# Patient Record
Sex: Male | Born: 1995
Health system: Southern US, Community
[De-identification: ages and names within clinical notes are randomized; demographics above are authoritative.]

## PROBLEM LIST (undated history)

## (undated) DIAGNOSIS — F329 Major depressive disorder, single episode, unspecified: Secondary | ICD-10-CM

## (undated) DIAGNOSIS — F909 Attention-deficit hyperactivity disorder, unspecified type: Secondary | ICD-10-CM

## (undated) DIAGNOSIS — F419 Anxiety disorder, unspecified: Secondary | ICD-10-CM

## (undated) DIAGNOSIS — F32A Depression, unspecified: Secondary | ICD-10-CM

## (undated) HISTORY — DX: Attention-deficit hyperactivity disorder, unspecified type: F90.9

---
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
## 2016-06-10 DIAGNOSIS — J101 Influenza due to other identified influenza virus with other respiratory manifestations: Secondary | ICD-10-CM | POA: Diagnosis not present

## 2016-08-08 DIAGNOSIS — H6503 Acute serous otitis media, bilateral: Secondary | ICD-10-CM | POA: Diagnosis not present

## 2016-08-08 DIAGNOSIS — J029 Acute pharyngitis, unspecified: Secondary | ICD-10-CM | POA: Diagnosis not present

## 2016-12-21 DIAGNOSIS — L03032 Cellulitis of left toe: Secondary | ICD-10-CM | POA: Diagnosis not present

## 2016-12-21 DIAGNOSIS — M79672 Pain in left foot: Secondary | ICD-10-CM | POA: Diagnosis not present

## 2017-01-03 DIAGNOSIS — L03032 Cellulitis of left toe: Secondary | ICD-10-CM | POA: Diagnosis not present

## 2017-05-02 DIAGNOSIS — F418 Other specified anxiety disorders: Secondary | ICD-10-CM | POA: Diagnosis not present

## 2017-06-12 DIAGNOSIS — F418 Other specified anxiety disorders: Secondary | ICD-10-CM | POA: Diagnosis not present

## 2017-06-21 DIAGNOSIS — F4323 Adjustment disorder with mixed anxiety and depressed mood: Secondary | ICD-10-CM | POA: Diagnosis not present

## 2017-06-27 DIAGNOSIS — F4323 Adjustment disorder with mixed anxiety and depressed mood: Secondary | ICD-10-CM | POA: Diagnosis not present

## 2017-07-20 ENCOUNTER — Inpatient Hospital Stay (HOSPITAL_COMMUNITY)
Admission: AD | Admit: 2017-07-20 | Discharge: 2017-07-25 | DRG: 885 | Disposition: A | Discharge status: Home / Self Care | Payer: BLUE CROSS/BLUE SHIELD | Attending: Psychiatry | Admitting: Psychiatry

## 2017-07-20 DIAGNOSIS — F332 Major depressive disorder, recurrent severe without psychotic features: Principal | ICD-10-CM | POA: Diagnosis present

## 2017-07-20 DIAGNOSIS — D696 Thrombocytopenia, unspecified: Secondary | ICD-10-CM | POA: Diagnosis present

## 2017-07-20 DIAGNOSIS — R45851 Suicidal ideations: Secondary | ICD-10-CM | POA: Diagnosis not present

## 2017-07-20 DIAGNOSIS — G4700 Insomnia, unspecified: Secondary | ICD-10-CM | POA: Diagnosis not present

## 2017-07-20 DIAGNOSIS — F39 Unspecified mood [affective] disorder: Secondary | ICD-10-CM | POA: Diagnosis not present

## 2017-07-20 DIAGNOSIS — Z630 Problems in relationship with spouse or partner: Secondary | ICD-10-CM | POA: Diagnosis not present

## 2017-07-20 DIAGNOSIS — F419 Anxiety disorder, unspecified: Secondary | ICD-10-CM | POA: Diagnosis not present

## 2017-07-20 DIAGNOSIS — R450 Nervousness: Secondary | ICD-10-CM | POA: Diagnosis not present

## 2017-07-20 DIAGNOSIS — Z560 Unemployment, unspecified: Secondary | ICD-10-CM | POA: Diagnosis not present

## 2017-07-20 HISTORY — DX: Anxiety disorder, unspecified: F41.9

## 2017-07-20 HISTORY — DX: Major depressive disorder, single episode, unspecified: F32.9

## 2017-07-20 HISTORY — DX: Depression, unspecified: F32.A

## 2017-07-20 MED ORDER — ALUM & MAG HYDROXIDE-SIMETH 200-200-20 MG/5ML PO SUSP
30.0000 mL | ORAL | Status: DC | PRN
Start: 2017-07-20 — End: 2017-07-25

## 2017-07-20 MED ORDER — MAGNESIUM HYDROXIDE 400 MG/5ML PO SUSP: 30.0000 mL | Freq: Every day | ORAL | Status: DC | PRN

## 2017-07-20 MED ORDER — TRAZODONE HCL 50 MG PO TABS
50.0000 mg | ORAL_TABLET | Freq: Every evening | ORAL | Status: DC | PRN
  Administered 2017-07-20: 50 mg via ORAL
  Filled 2017-07-20: qty 1

## 2017-07-20 MED ORDER — HYDROXYZINE HCL 10 MG PO TABS
10.0000 mg | ORAL_TABLET | Freq: Three times a day (TID) | ORAL | Status: DC | PRN
  Administered 2017-07-21 – 2017-07-25 (×7): 10 mg via ORAL
  Filled 2017-07-20 (×7): qty 1

## 2017-07-20 MED ORDER — ACETAMINOPHEN 325 MG PO TABS
650.0000 mg | ORAL_TABLET | Freq: Four times a day (QID) | ORAL | Status: DC | PRN
  Administered 2017-07-21 – 2017-07-25 (×2): 650 mg via ORAL
  Filled 2017-07-20 (×2): qty 2

## 2017-07-20 NOTE — BH Assessment (Addendum)
Assessment Note  Johnny Delgado is an 22 y.o. male who presents to [facility] as a walk-in. Pt reports he has been dealing with SI for the past 6 months and recently his thoughts have progressed into action plans. Pt states yesterday he stood in the middle of the road waiting to get hit by a car. Pt states he has attempted suicide twice in the past but will not disclose his methods. Pt was asked to identify the stressors that are causing him to feel suicidal and pt responded, "everything, just everything, this past year I've had a lot going on." Pt remains guarded throughout the assessment and does not disclose specifics to this Clinical research associate. Pt states he does not sleep well and often wakes up feeling fatigued. Pt reports his appetite is decreased and he eats "once or twice a day." Pt states he recently quit his job because his boss was "being a jerk."   Pt states he lives at home with his mother and brother. Pt denies any SA hx. Pt states his family convinced him to come to [facility] for help and states he is willing to stay voluntarily. Pt denies any hx of inpt hospitalization but states he is followed by an OPT provider for depression and anxiety.   Per Nira Conn, NP pt meets criteria for inpt treatment. Pt has been accepted to [facility] 402-1.  Diagnosis: MDD, recurrent, severe w/o psychosis  Past Medical History: No past medical history on file.   Family History: No family history on file.  Social History:  has no tobacco, alcohol, and drug history on file.  Additional Social History:  Alcohol / Drug Use Pain Medications: See MAR Prescriptions: See MAR Over the Counter: See MAR History of alcohol / drug use?: No history of alcohol / drug abuse  CIWA: CIWA-Ar BP: 125/81 Pulse Rate: 75 COWS:    Allergies: Allergies not on file  Home Medications:  No medications prior to admission.    OB/GYN Status:  No LMP for male patient.  General Assessment Data Location of Assessment: [facility] Assessment  Services TTS Assessment: In system Is this a Tele or Face-to-Face Assessment?: Face-to-Face Is this an Initial Assessment or a Re-assessment for this encounter?: Initial Assessment Marital status: Single Is patient pregnant?: No Pregnancy Status: No Living Arrangements: Parent, Other relatives Can pt return to current living arrangement?: Yes Admission Status: Voluntary Is patient capable of signing voluntary admission?: Yes Referral Source: Self/Family/Friend Insurance type: BCBS  Medical Screening Exam [facility] Walk-in ONLY) Medical Exam completed: Yes  Crisis Care Plan Living Arrangements: Parent, Other relatives Name of Psychiatrist: none Name of Therapist: Dr. Loreta Ave  Education Status Is patient currently in school?: No Highest grade of school patient has completed: some college   Risk to self with the past 6 months Suicidal Ideation: Yes-Currently Present Has patient been a risk to self within the past 6 months prior to admission? : Yes Suicidal Intent: Yes-Currently Present Has patient had any suicidal intent within the past 6 months prior to admission? : Yes Is patient at risk for suicide?: Yes Suicidal Plan?: Yes-Currently Present Has patient had any suicidal plan within the past 6 months prior to admission? : Yes Specify Current Suicidal Plan: pt states he attempted to get hit by a car by standing in the road  Access to Means: Yes Specify Access to Suicidal Means: pt has access to traffic  What has been your use of drugs/alcohol within the last 12 months?: denies  Previous Attempts/Gestures: Yes How many  times?: 2 Triggers for Past Attempts: Family contact, Spouse contact, Other personal contacts Intentional Self Injurious Behavior: Cutting Comment - Self Injurious Behavior: pt reports a hx of self-harming behaviors  Family Suicide History: Yes(cousin killed himself) Recent stressful life event(s): Job Loss, Financial Problems(pt is vague and does not give  specifics) Persecutory voices/beliefs?: No Depression: Yes Depression Symptoms: Despondent, Insomnia, Isolating, Tearfulness, Fatigue, Guilt, Loss of interest in usual pleasures, Feeling worthless/self pity Substance abuse history and/or treatment for substance abuse?: No Suicide prevention information given to non-admitted patients: Not applicable  Risk to Others within the past 6 months Homicidal Ideation: No Does patient have any lifetime risk of violence toward others beyond the six months prior to admission? : No Thoughts of Harm to Others: No Current Homicidal Intent: No Current Homicidal Plan: No Access to Homicidal Means: No History of harm to others?: No Assessment of Violence: None Noted Does patient have access to weapons?: No(guns in the home are locked away) Criminal Charges Pending?: No Does patient have a court date: No Is patient on probation?: No  Psychosis Hallucinations: None noted Delusions: None noted  Mental Status Report Appearance/Hygiene: Unremarkable Eye Contact: Good Motor Activity: Freedom of movement Speech: Logical/coherent Level of Consciousness: Alert Mood: Depressed, Worthless, low self-esteem, Sad, Sullen, Despair Affect: Depressed, Sad, Flat, Constricted, Sullen Anxiety Level: None Thought Processes: Relevant, Coherent Judgement: Impaired Orientation: Person, Place, Time, Situation, Appropriate for developmental age Obsessive Compulsive Thoughts/Behaviors: None  Cognitive Functioning Concentration: Normal Memory: Remote Intact, Recent Intact IQ: Average Insight: Poor Impulse Control: Poor Appetite: Fair Sleep: Decreased Total Hours of Sleep: 5 Vegetative Symptoms: None  ADLScreening [facility] Assessment Services) Patient's cognitive ability adequate to safely complete daily activities?: Yes Patient able to express need for assistance with ADLs?: Yes Independently performs ADLs?: Yes (appropriate for developmental age)  Prior  Inpatient Therapy Prior Inpatient Therapy: No  Prior Outpatient Therapy Prior Outpatient Therapy: Yes Prior Therapy Dates: current Prior Therapy Facilty/Provider(s): Dr. Loreta AveMann Reason for Treatment: MDD Does patient have an ACCT team?: No Does patient have Intensive In-House Services?  : No Does patient have [facility] services? : No Does patient have P4CC services?: No  ADL Screening (condition at time of admission) Patient's cognitive ability adequate to safely complete daily activities?: Yes Is the patient deaf or have difficulty hearing?: No Does the patient have difficulty seeing, even when wearing glasses/contacts?: No Does the patient have difficulty concentrating, remembering, or making decisions?: No Patient able to express need for assistance with ADLs?: Yes Does the patient have difficulty dressing or bathing?: No Independently performs ADLs?: Yes (appropriate for developmental age) Does the patient have difficulty walking or climbing stairs?: No Weakness of Legs: None Weakness of Arms/Hands: None  Home Assistive Devices/Equipment Home Assistive Devices/Equipment: None    Abuse/Neglect Assessment (Assessment to be complete while patient is alone) Abuse/Neglect Assessment Can Be Completed: Yes Physical Abuse: Denies Verbal Abuse: Denies Sexual Abuse: Denies Exploitation of patient/patient's resources: Denies Self-Neglect: Denies     Merchant navy officerAdvance Directives (For Healthcare) Does Patient Have a Medical Advance Directive?: No Would patient like information on creating a medical advance directive?: No - Patient declined    Additional Information 1:1 In Past 12 Months?: No CIRT Risk: No Elopement Risk: No Does patient have medical clearance?: Yes     Disposition: Per Nira ConnJason Berry, NP pt meets criteria for inpt treatment. [facility]   Disposition Initial Assessment Completed for this Encounter: Yes Disposition of Patient: Inpatient treatment  program Type of inpatient  treatment program: Adult([facility] 402-1 )  On Site Evaluation by:   Reviewed with Physician:    Karolee Ohs 07/20/2017 10:42 PM

## 2017-07-20 NOTE — H&P (Signed)
Behavioral Health Medical Screening Exam  Johnny Delgado is an 22 y.o. male.  Total Time spent with patient: 20 minutes  Psychiatric Specialty Exam: Physical Exam  Constitutional: He is oriented to person, place, and time. He appears well-developed and well-nourished. No distress.  HENT:  Head: Normocephalic and atraumatic.  Right Ear: External ear normal.  Left Ear: External ear normal.  Eyes: Conjunctivae are normal. Right eye exhibits no discharge. Left eye exhibits no discharge. No scleral icterus.  Cardiovascular: Normal rate.  Respiratory: Effort normal. No respiratory distress.  Musculoskeletal: Normal range of motion.  Neurological: He is alert and oriented to person, place, and time.  Skin: Skin is warm and dry. He is not diaphoretic.  Psychiatric: His speech is normal. His mood appears anxious. His affect is blunt. He is withdrawn. He is not actively hallucinating. Thought content is not paranoid and not delusional. He does not express impulsivity or inappropriate judgment. He exhibits a depressed mood. He expresses suicidal ideation. He expresses no homicidal ideation. He expresses suicidal plans.    Review of Systems  Constitutional: Negative for chills, fever and weight loss.  Psychiatric/Behavioral: Positive for depression and suicidal ideas. Negative for hallucinations, memory loss and substance abuse. The patient is nervous/anxious and has insomnia.   All other systems reviewed and are negative.   Blood pressure 125/81, pulse 75, temperature 98.6 F (37 C), resp. rate 16, SpO2 99 %.There is no height or weight on file to calculate BMI.  General Appearance: Casual and Well Groomed  Eye Contact:  Fair  Speech:  Clear and Coherent and Normal Rate  Volume:  Decreased  Mood:  Anxious, Depressed, Dysphoric, Hopeless and Worthless  Affect:  Blunt and Congruent  Thought Process:  Coherent, Goal Directed and Descriptions of Associations: Intact  Orientation:  Full (Time,  Place, and Person)  Thought Content:  Logical and Hallucinations: None  Suicidal Thoughts:  Yes.  with intent/plan  Homicidal Thoughts:  No  Memory:  Immediate;   Good Recent;   Good  Judgement:  Impaired  Insight:  Lacking  Psychomotor Activity:  Decreased  Concentration: Concentration: Fair and Attention Span: Fair  Recall:  Good  Fund of Knowledge:Good  Language: Good  Akathisia:  No  Handed:  Right  AIMS (if indicated):     Assets:  Communication Skills Desire for Improvement Financial Resources/Insurance Housing Intimacy Leisure Time Physical Health Transportation  Sleep:       Musculoskeletal: Strength & Muscle Tone: within normal limits Gait & Station: normal  Blood pressure 125/81, pulse 75, temperature 98.6 F (37 C), resp. rate 16, SpO2 99 %.  Recommendations:  Based on my evaluation the patient does not appear to have an emergency medical condition.    Jackelyn PolingJason A Oni Dietzman, NP 07/20/2017, 10:31 PM

## 2017-07-21 ENCOUNTER — Other Ambulatory Visit: Payer: Self-pay

## 2017-07-21 ENCOUNTER — Encounter (HOSPITAL_COMMUNITY): Payer: Self-pay

## 2017-07-21 DIAGNOSIS — F332 Major depressive disorder, recurrent severe without psychotic features: Principal | ICD-10-CM

## 2017-07-21 DIAGNOSIS — R45851 Suicidal ideations: Secondary | ICD-10-CM

## 2017-07-21 DIAGNOSIS — Z630 Problems in relationship with spouse or partner: Secondary | ICD-10-CM

## 2017-07-21 DIAGNOSIS — Z560 Unemployment, unspecified: Secondary | ICD-10-CM

## 2017-07-21 LAB — CBC
HCT: 44 % (ref 39.0–52.0)
Hemoglobin: 15.2 g/dL (ref 13.0–17.0)
MCH: 29.3 pg (ref 26.0–34.0)
MCHC: 34.5 g/dL (ref 30.0–36.0)
MCV: 84.8 fL (ref 78.0–100.0)
PLATELETS: 75 10*3/uL — AB (ref 150–400)
RBC: 5.19 MIL/uL (ref 4.22–5.81)
RDW: 12.4 % (ref 11.5–15.5)
WBC: 7.1 10*3/uL (ref 4.0–10.5)

## 2017-07-21 LAB — COMPREHENSIVE METABOLIC PANEL
ALBUMIN: 4.5 g/dL (ref 3.5–5.0)
ALT: 21 U/L (ref 17–63)
AST: 21 U/L (ref 15–41)
Alkaline Phosphatase: 54 U/L (ref 38–126)
Anion gap: 7 (ref 5–15)
BUN: 10 mg/dL (ref 6–20)
CHLORIDE: 105 mmol/L (ref 101–111)
CO2: 27 mmol/L (ref 22–32)
CREATININE: 0.9 mg/dL (ref 0.61–1.24)
Calcium: 9.2 mg/dL (ref 8.9–10.3)
GFR calc non Af Amer: >60 mL/min (ref >60)
GLUCOSE: 91 mg/dL (ref 65–99)
Potassium: 3.7 mmol/L (ref 3.5–5.1)
SODIUM: 139 mmol/L (ref 135–145)
Total Bilirubin: 0.8 mg/dL (ref 0.3–1.2)
Total Protein: 7.5 g/dL (ref 6.5–8.1)

## 2017-07-21 LAB — LIPID PANEL
CHOL/HDL RATIO: 5.4 ratio
Cholesterol: 172 mg/dL (ref 0–200)
HDL: 32 mg/dL — ABNORMAL LOW (ref >40)
LDL Cholesterol: 114 mg/dL — ABNORMAL HIGH (ref 0–99)
Triglycerides: 132 mg/dL (ref <150)
VLDL: 26 mg/dL (ref 0–40)

## 2017-07-21 LAB — TSH: TSH: 1.162 u[IU]/mL (ref 0.350–4.500)

## 2017-07-21 LAB — HEMOGLOBIN A1C
Hgb A1c MFr Bld: 5.7 % — ABNORMAL HIGH (ref 4.8–5.6)
MEAN PLASMA GLUCOSE: 116.89 mg/dL

## 2017-07-21 MED ORDER — TRAZODONE HCL 50 MG PO TABS
50.0000 mg | ORAL_TABLET | Freq: Every day | ORAL | Status: DC
  Administered 2017-07-21 – 2017-07-24 (×4): 50 mg via ORAL
  Filled 2017-07-21 (×7): qty 1

## 2017-07-21 MED ORDER — FLUOXETINE HCL 20 MG PO CAPS
40.0000 mg | ORAL_CAPSULE | Freq: Every day | ORAL | Status: DC
  Administered 2017-07-21 – 2017-07-25 (×5): 40 mg via ORAL
  Filled 2017-07-21 (×8): qty 2

## 2017-07-21 NOTE — B[facility] Suicide Risk Assessment (Signed)
[facility]   Nursing information obtained from:    Demographic factors:    Current Mental Status:    Loss Factors:    Historical Factors:    Risk Reduction Factors:     Total Time spent with patient: 45 minutes Principal Problem: Severe recurrent major depression without psychotic features (HCC) Diagnosis:   Patient Active Problem List   Diagnosis Date Noted  . Severe recurrent major depression without psychotic features [facility](HCC) [F33.2] 07/20/2017   Subjective Data:  22 y.o Caucasian male, single, lives with his family, unemployed, dropped out of college. Background history of MDD. Presented to the unit unaccompanied. Says his mother dropped him off. Reports being depressed and overwhelmed in the past six months. Guarded about his stressors at presentation. States that he has been thinking of dying for a while. He recently attempted to walk into traffic. Routine labs shows thrombocytopenia. No UDS or BAL on file. No past suicidal behavior, no family history of suicide, no evidence of psychosis. No evidence of mania. No cognitive impairment. No access to weapons. He is cooperative with care. He has agreed to treatment recommendations. He has agreed to communicate suicidal thoughts to staff if the thoughts becomes overwhelming.      Continued Clinical Symptoms:  Alcohol Use Disorder Identification Test Final Score (AUDIT): 0 The "Alcohol Use Disorders Identification Test", Guidelines for Use in Primary Care, Second Edition.  World Science writerHealth Organization [facility](WHO). Score between 0-7:  no or low risk or alcohol related problems. Score between 8-15:  moderate risk of alcohol related problems. Score between 16-19:  high risk of alcohol related problems. Score 20 or above:  warrants further diagnostic evaluation for alcohol dependence and treatment.   CLINICAL FACTORS:   Depression:   Impulsivity   Musculoskeletal: Strength & Muscle Tone: within normal limits Gait &  Station: normal Patient leans: N/A  Psychiatric Specialty Exam: Physical Exam  ROS  Blood pressure 125/81, pulse 96, temperature 97.7 F (36.5 C), temperature source Oral, resp. rate 18, height 5\' 9"  (1.753 m), weight 82.1 kg (181 lb), SpO2 99 %.Body mass index is 26.73 kg/m.  General Appearance: As in H&P  Eye Contact:    Speech:    Volume:    Mood:    Affect:    Thought Process:    Orientation:    Thought Content:    Suicidal Thoughts:    Homicidal Thoughts:    Memory:    Judgement:  As in H&P  Insight:    Psychomotor Activity:    Concentration:    Recall:    Fund of Knowledge:    Language:    Akathisia:    Handed:    AIMS (if indicated):     Assets:    ADL's:    Cognition:  As in H&P  Sleep:  Number of Hours: 5.5      COGNITIVE FEATURES THAT CONTRIBUTE TO RISK:   None  SUICIDE RISK:   Mild  PLAN OF CARE:  As in H&P I certify that inpatient services furnished can reasonably be expected to improve the patient's condition.   Georgiann CockerVincent A Izediuno, MD 07/21/2017, 12:23 PM

## 2017-07-21 NOTE — BHH Counselor (Signed)
Adult Comprehensive Assessment  Patient ID: Johnny Delgado, male   DOB: 1995/11/03, 22 y.o.   MRN: [id number]  Information Source: Information source: Patient  Current Stressors:  Educational / Learning stressors: Flunked out of college after first year, tried to go to [employer], but [employer] fell through so could not go this semester. Employment / Job issues: Quit job Wednesday, because boss was degrading Family Relationships: Father recently moved to [place], has been difficult for everyone. Financial / Lack of resources (include bankruptcy): Not making any money. Housing / Lack of housing: Denies stressors. Physical health (include injuries & life threatening diseases): Denies stressors. Social relationships: Few social relationships, but still hangs out with people. Substance abuse: Denies stressors. Bereavement / Loss: Lost one grandmother December 2017, other grandmother December 2016.  Living/Environment/Situation:  Living Arrangements: Parent, Other relatives(Mother, brother) Living conditions (as described by patient or guardian): Good How long has patient lived in current situation?: Whole life What is atmosphere in current home: Comfortable, Supportive  Family History:  Marital status: Single Are you sexually active?: No What is your sexual orientation?: Homosexual Does patient have children?: No  Childhood History:  By whom was/is the patient raised?: Both parents Description of patient's relationship with caregiver when they were a child: Mother - good, always connected well; Father - distant  Patient's description of current relationship with people who raised him/her: Mother - good; Father - building a better relationship, he just moved to [place] in July 2018.  Parents are still technically together. How were you disciplined when you got in trouble as a child/adolescent?: Grounding or spanking Does patient have siblings?: Yes Number of Siblings: 2 Description  of patient's current relationship with siblings: older sister (don't talk much, but like each other, would like to work on this relationship); younger brother (really good relationship) Did patient suffer any verbal/emotional/physical/sexual abuse as a child?: No Did patient suffer from severe childhood neglect?: No Has patient ever been sexually abused/assaulted/raped as an adolescent or adult?: No Was the patient ever a victim of a crime or a disaster?: No Witnessed domestic violence?: No Has patient been effected by domestic violence as an adult?: No  Education:  Highest grade of school patient has completed: Some college Currently a student?: No Learning disability?: No  Employment/Work Situation:   Employment situation: Unemployed(Only worked 3 days in his most recent job, quit Wednesday) What is the longest time patient has a held a job?: 3 years Where was the patient employed at that time?: Retail Has patient ever been in the [employer]?: Yes (Describe in comment)([employer] - 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 Are There Guns or Other Weapons in Your Home?: Yes Types of Guns/Weapons: Revolvers, pistols, rifles Are These Weapons Safely Secured?: Yes(Locked in gun safe, pt does not have access.)  Financial Resources:   Financial resources: Support from parents / caregiver, Private insurance(BCBS) Does patient have a Lawyer or guardian?: No  Alcohol/Substance Abuse:   What has been your use of drugs/alcohol within the last 12 months?: Has drank 3-4 times in the last year. Alcohol/Substance Abuse Treatment Hx: Denies past history Has alcohol/substance abuse ever caused legal problems?: No  Social Support System:    Patient's Community Support System: Good Describe Community Support System: Mother, father,  family, friends Type of faith/religion: Christianity How does patient's faith help to cope with current illness?: Praying helps  Leisure/Recreation:   Leisure and Hobbies: Draws, plays video games, goes out with friends occasionally  Strengths/Needs:   What things does the patient do well?: Drawing, video games, trombone In what areas does patient struggle / problems for patient: Depression, anxiety, thoughts of suicide, accepting and loving himself  Discharge Plan:   Does patient have access to transportation?: Yes Will patient be returning to same living situation after discharge?: Yes Currently receiving community mental health services: Yes (From Whom)([facility] - therapy; [facility] - medication management) Does patient have financial barriers related to discharge medications?: No  Summary/Recommendations:   Summary and Recommendations (to be completed by the evaluator): Patient is a 22yo male admitted with suicidal ideation over the last 6 months that have progressed into action plans, standing in the road prior to admission, waiting to get hit by a car, along with 2 previous suicide attempts that he would not discuss in detail.  Primary stressors include flunking out of college, not being able to go to community college because [facility] [employer] [facility] funding fell through, [place], insomnia, and quitting his job a few days ago after just 3 days because his boss was "a jerk."  Patient will benefit from crisis stabilization, medication evaluation, group therapy and psychoeducation, in addition to case management for discharge planning. At discharge it is recommended that Patient adhere to the established discharge plan and continue in treatment.  Lynnell ChadMareida J Grossman-Orr. 07/21/2017

## 2017-07-21 NOTE — BHH Group Notes (Signed)
LCSW Group Therapy Note  07/21/2017 9:30-10:30AM - 300 Hall, 10:30-11:30 - 400 Hall, 11:30-12:00 - 500 Hall  Type of Therapy and Topic:  Group Therapy: Anger Cues and Responses  Participation Level:  Active   Description of Group:   In this group, patients learned how to recognize the physical, cognitive, emotional, and behavioral responses they have to anger-provoking situations.  They identified a recent time they became angry and how they reacted.  They analyzed how their reaction was possibly beneficial and how it was possibly unhelpful.  The group discussed a variety of healthier coping skills that could help with such a situation in the future.  Deep breathing was practiced briefly.  Therapeutic Goals: 1. Patients will remember their last incident of anger and how they felt emotionally and physically, what their thoughts were at the time, and how they behaved. 2. Patients will identify how their behavior at that time worked for them, as well as how it worked against them. 3. Patients will explore possible new behaviors to use in future anger situations. 4. Patients will learn that anger itself is normal and cannot be eliminated, and that healthier reactions can assist with resolving conflict rather than worsening situations.  Summary of Patient Progress:  The patient shared that their most recent time of anger was last night when his friends performed an intervention about his depression and suicidal ideation.  He was gone for much of group seeing a provider.  Therapeutic Modalities:   Cognitive Behavioral Therapy  Lynnell ChadMareida J Grossman-Orr  07/21/2017 8:30 AM

## 2017-07-21 NOTE — Progress Notes (Signed)
Patient ID: Johnny Delgado, male   DOB: 12/16/1995, 21 y.o.   MRN: [id number]  Pt currently presents with a flat affect and appropriate behavior. Pt reports to writer that their goal is to "go to more groups." Pt states "I am feeling better, talking with other people who are feeling like me is great." Reports a good visit with brother and mother tonight. Pt reports good sleep with current medication regimen.   Pt provided with medications per providers orders. Pt's labs and vitals were monitored throughout the night. Pt given a 1:1 about emotional and mental status. Pt supported and encouraged to express concerns and questions. Pt educated on medications.  Pt's safety ensured with 15 minute and environmental checks. Pt currently denies SI/HI and A/V hallucinations. Pt verbally agrees to seek staff if SI/HI or A/VH occurs and to consult with staff before acting on any harmful thoughts. Will continue POC.

## 2017-07-21 NOTE — Progress Notes (Signed)
Patient ID: Johnny Delgado, male   DOB: 01/20/1996, 21 y.o.   MRN: [id number]  22 [facility]. Pt reports that while hanging out with friends last night he walked into traffic and had to be brought back to the shoulder. Brother and friend approached patient today to which patient endorsed ongoing depression and thoughts of killing himself. Pt brother notified mom and [facility]. Pt was started on Prozac "3-4 months ago" by his PCP and had been seeing Dr. Loreta AveMann for OP therapy. Pt missed last appointment 2 weeks ago. Pt states he wishes to "handle all of this and find peace with myself" [facility]. Reports on-going symptoms of depression including insomnia, anhedonia and difficulty concentrating. Consents signed, skin search completed and pt oriented to unit. Pt had no belongings upon admission, reports he sent everything home with mom. Pt stable at this time. Pt given the opportunity to express concerns and ask questions. Pt given toiletries. Will continue to monitor.

## 2017-07-21 NOTE — [facility] Group Notes (Signed)
   Date:  07/21/2017      Orientation / Goals    Time:  6:18 PM  Type of Therapy:  Nurse Education// The group focuses on teaching patients who their staff is and what the staff are responsibilityes as well as when they will snuggle and go to sleep.   Participation Level:  Active  Participation Quality:  Attentive  Affect:  Appropriate  Cognitive:  Appropriate  Insight:  Limited  Engagement in Group:  Engaged  Modes of Intervention:  Education  Summary of Progress/Problems:  Rich BraveDuke, Mairin Lindsley Lynn 07/21/2017, 6:18 PM
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
## 2017-07-21 NOTE — Tx Team (Signed)
Initial Treatment Plan 07/21/2017 1:17 AM Johnny BarbaraNolan Delgado Simons ZOX:[id number]    PATIENT STRESSORS: Medication change or noncompliance Other: ongoing depression and suicidal ideation   PATIENT STRENGTHS: Ability for insight Average or above average intelligence Communication skills Motivation for treatment/growth Supportive family/friends   PATIENT IDENTIFIED PROBLEMS: Depression  Suicidal ideation with plan - " walk into street"   "handle all of this- I don't think it's curable"  "find peace with myself"               DISCHARGE CRITERIA:  Ability to meet basic life and health needs Improved stabilization in mood, thinking, and/or behavior Need for constant or close observation no longer present Verbal commitment to aftercare and medication compliance  PRELIMINARY DISCHARGE PLAN: Outpatient therapy Return to previous living arrangement  PATIENT/FAMILY INVOLVEMENT: This treatment plan has been presented to and reviewed with the patient, Johnny Delgado Qadir .  The patient and family have been given the opportunity to ask questions and make suggestions.  Aurora Maskwyman, Cherokee Clowers E, RN 07/21/2017, 1:17 AM

## 2017-07-22 LAB — RAPID URINE DRUG SCREEN, HOSP PERFORMED
Amphetamines: NOT DETECTED
BARBITURATES: NOT DETECTED
Benzodiazepines: NOT DETECTED
Cocaine: NOT DETECTED
OPIATES: NOT DETECTED
TETRAHYDROCANNABINOL: NOT DETECTED

## 2017-07-22 NOTE — Progress Notes (Signed)
D. Pt presents with a flat affect and calm, cooperative behavior- observed attending group this am. Per pt's self inventory, pt rates his depression, hopelessness and anxiety a 4/3/3, respectively. Pt writes that his most important goal today is "developing skills to remain calm when feeling anxious" and "ask others for tips on how they handle it". Pt currently denies SI/HI and AVH A. Labs and vitals monitored. Pt given and educated on medications. Pt supported emotionally and encouraged to express concerns and ask questions.   R. Pt remains safe with 15 minute checks. Will continue POC.

## 2017-07-22 NOTE — [facility] Group Notes (Signed)
[facility] Group Notes:  (MHT Orientation)  Date:  07/22/2017  Time:  10 am Type of Therapy:  Orientation  Participation Level:  Active  Participation Quality:  Appropriate and Attentive  Affect:  Appropriate  Cognitive:  Alert and Appropriate  Insight:  Appropriate  Engagement in Group:  Engaged  Modes of Intervention:  Discussion  Summary of Progress/Problems: Patient engaged and participated appropriately in am group led by MHT Shela NevinValerie S Jadene Stemmer 07/22/2017, 1:56 PM

## 2017-07-22 NOTE — [facility] Group Notes (Signed)
[facility]  Date/Time:  07/22/2017 10:00-11:00AM  Type of Therapy and Topic:  Group Therapy:  Healthy and Unhealthy Supports  Participation Level:  Active   Description of Group:  Patients in this group were introduced to the idea of adding a variety of healthy supports to address the various needs in their lives.Patients discussed what additional healthy supports could be helpful in their recovery and wellness after discharge in order to prevent future hospitalizations.   An emphasis was placed on using counselor, doctor, therapy groups, 12-step groups, and problem-specific support groups to expand supports.  They also worked as a group on developing a specific plan for several patients to deal with unhealthy supports through boundary-setting, psychoeducation with loved ones, and even termination of relationships.   Therapeutic Goals:   1)  discuss importance of adding supports to stay well once out of the hospital  2)  compare healthy versus unhealthy supports and identify some examples of each  3)  generate ideas and descriptions of healthy supports that can be added  4)  offer mutual support about how to address unhealthy supports  5)  encourage active participation in and adherence to discharge plan    Summary of Patient Progress:  The patient expressed a willingness to add being a better self-support by opening up to people in his life who are supportive to help in his recovery journey.   Therapeutic Modalities:   Motivational Interviewing Brief Solution-Focused Therapy  Ambrose MantleMareida Grossman-Orr, LCSW

## 2017-07-22 NOTE — [facility] Group Notes (Signed)
[facility] Group Notes:  (MHT Orientation)  Date:  07/22/2017  Time:  10:41 AM  Type of Therapy:  Orientation  Participation Level:  Active  Participation Quality:  Appropriate and Attentive  Affect:  Appropriate  Cognitive:  Alert and Appropriate  Insight:  Appropriate  Engagement in Group:  Engaged  Modes of Intervention:  Discussion  Summary of Progress/Problems: Pt remained engaged during orientation group led by MHT  Shela NevinValerie S Oree Mirelez 07/22/2017, 10:41 AM

## 2017-07-22 NOTE — Progress Notes (Signed)
[place] MD Progress Note  07/22/2017 2:59 PM Johnny Delgado  MRN:  [id number] Subjective:   22 y.o Caucasian male, single, lives with his family, unemployed, dropped out of college. Background history of MDD. Presented to the unit unaccompanied. Says his mother dropped him off. Reports being depressed and overwhelmed in the past six months. Guarded about his stressors at presentation. States that he has been thinking of dying for a while. He recently attempted to walk into traffic. Routine labs shows thrombocytopenia. No UDS or BAL on file.  Chart reviewed today. Patient discussed at team today.  Staff reports that he has been participating at unit groups and activities. No behavioral issue. He has not voiced any futility thoughts. He has not been observed to be internally stimulated.   Seen today. Says he is feels good today. Is family came to visit last night. Says it went well. He has not had any suicidal thoughts lately. Says groups here has been helpful. He slept well last night. His energy levels are better. We talked about video games. Enjoys to talk about the games he plays. No evidence of psychosis. No evidence of mania. Encouraged.    Principal Problem: Severe recurrent major depression without psychotic features ([place]) Diagnosis:   Patient Active Problem List   Diagnosis Date Noted  . Severe recurrent major depression without psychotic features ([place]) [F33.2] 07/20/2017   Total Time spent with patient: 20 minutes  Past Psychiatric History: As in H&P  Past Medical History:  Past Medical History:  Diagnosis Date  . Anxiety   . Depression    History reviewed. No pertinent surgical history. Family History: History reviewed. No pertinent family history. Family Psychiatric  History: As in H&P Social History:  Social History   Substance and Sexual Activity  Alcohol Use No  . Frequency: Never     Social History   Substance and Sexual Activity  Drug Use No    Social History    Socioeconomic History  . Marital status: Single    Spouse name: None  . Number of children: None  . Years of education: None  . Highest education level: None  Social Needs  . Financial resource strain: None  . Food insecurity - worry: None  . Food insecurity - inability: None  . Transportation needs - medical: None  . Transportation needs - non-medical: None  Occupational History  . None  Tobacco Use  . Smoking status: Never Smoker  . Smokeless tobacco: Never Used  Substance and Sexual Activity  . Alcohol use: No    Frequency: Never  . Drug use: No  . Sexual activity: No  Other Topics Concern  . None  Social History Narrative  . None   Additional Social History:    Pain Medications: See MAR Prescriptions: See MAR Over the Counter: See MAR History of alcohol / drug use?: No history of alcohol / drug abuse                    Sleep: Good  Appetite:  Good  Current Medications: Current Facility-Administered Medications  Medication Dose Route Frequency Provider Last Rate Last Dose  . acetaminophen (TYLENOL) tablet 650 mg  650 mg Oral Q6H PRN Lindon Romp A, NP   650 mg at 07/21/17 1817  . alum & mag hydroxide-simeth (MAALOX/MYLANTA) 200-200-20 MG/5ML suspension 30 mL  30 mL Oral Q4H PRN Lindon Romp A, NP      . FLUoxetine (PROZAC) capsule 40 mg  40 mg Oral  Daily Artist Beach, MD   40 mg at 07/22/17 0801  . hydrOXYzine (ATARAX/VISTARIL) tablet 10 mg  10 mg Oral TID PRN Rozetta Nunnery, NP   10 mg at 07/21/17 2140  . magnesium hydroxide (MILK OF MAGNESIA) suspension 30 mL  30 mL Oral Daily PRN Rozetta Nunnery, NP      . traZODone (DESYREL) tablet 50 mg  50 mg Oral QHS Artist Beach, MD   50 mg at 07/21/17 2140    Lab Results:  Results for orders placed or performed during the hospital encounter of 07/20/17 (from the past 48 hour(s))  CBC     Status: Abnormal   Collection Time: 07/21/17  6:13 AM  Result Value Ref Range   WBC 7.1 4.0 - 10.5 K/uL    RBC 5.19 4.22 - 5.81 MIL/uL   Hemoglobin 15.2 13.0 - 17.0 g/dL   HCT 44.0 39.0 - 52.0 %   MCV 84.8 78.0 - 100.0 fL   MCH 29.3 26.0 - 34.0 pg   MCHC 34.5 30.0 - 36.0 g/dL   RDW 12.4 11.5 - 15.5 %   Platelets 75 (L) 150 - 400 K/uL    Comment: REPEATED TO VERIFY SPECIMEN CHECKED FOR CLOTS PLATELET COUNT CONFIRMED BY SMEAR Performed at [facility] [address] [place], [place] [address]   Comprehensive metabolic panel     Status: None   Collection Time: 07/21/17  6:13 AM  Result Value Ref Range   Sodium 139 135 - 145 mmol/L   Potassium 3.7 3.5 - 5.1 mmol/L   Chloride 105 101 - 111 mmol/L   CO2 27 22 - 32 mmol/L   Glucose, Bld 91 65 - 99 mg/dL   BUN 10 6 - 20 mg/dL   Creatinine, Ser 0.90 0.61 - 1.24 mg/dL   Calcium 9.2 8.9 - 10.3 mg/dL   Total Protein 7.5 6.5 - 8.1 g/dL   Albumin 4.5 3.5 - 5.0 g/dL   AST 21 15 - 41 U/L   ALT 21 17 - 63 U/L   Alkaline Phosphatase 54 38 - 126 U/L   Total Bilirubin 0.8 0.3 - 1.2 mg/dL   GFR calc non Af Amer >60 >60 mL/min   GFR calc Af Amer >60 >60 mL/min    Comment: (NOTE) The eGFR has been calculated using the CKD EPI equation. This calculation has not been validated in all clinical situations. eGFR's persistently <60 mL/min signify possible Chronic Kidney Disease.    Anion gap 7 5 - 15    Comment: Performed at [facility], [place] [address] [place], [place] [address]  Hemoglobin A1c     Status: Abnormal   Collection Time: 07/21/17  6:13 AM  Result Value Ref Range   Hgb A1c MFr Bld 5.7 (H) 4.8 - 5.6 %    Comment: (NOTE) Pre diabetes:          5.7%-6.4% Diabetes:              >6.4% Glycemic control for   <7.0% adults with diabetes    Mean Plasma Glucose 116.89 mg/dL    Comment: Performed at [facility] [address] [place],  [address]  Lipid panel     Status: Abnormal   Collection Time: 07/21/17  6:13 AM  Result Value Ref Range   Cholesterol 172 0 - 200 mg/dL   Triglycerides  132 <150 mg/dL   HDL 32 (L) >40 mg/dL   Total CHOL/HDL Ratio 5.4  RATIO   VLDL 26 0 - 40 mg/dL   LDL Cholesterol 114 (H) 0 - 99 mg/dL    Comment:        Total Cholesterol/HDL:CHD Risk Coronary Heart Disease Risk Table                     Men   Women  1/2 Average Risk   3.4   3.3  Average Risk       5.0   4.4  2 X Average Risk   9.6   7.1  3 X Average Risk  23.4   11.0        Use the calculated Patient Ratio above and the CHD Risk Table to determine the patient's CHD Risk.        ATP III CLASSIFICATION (LDL):  <100     mg/dL   Optimal  100-129  mg/dL   Near or Above                    Optimal  130-159  mg/dL   Borderline  160-189  mg/dL   High  >190     mg/dL   Very High Performed at [facility] [address], [place], [place] [address]   TSH     Status: None   Collection Time: 07/21/17  6:13 AM  Result Value Ref Range   TSH 1.162 0.350 - 4.500 uIU/mL    Comment: Performed by a 3rd Generation assay with a functional sensitivity of <=0.01 uIU/mL. Performed at [facility], [place] [address], [place], [place] [address]   Urine rapid drug screen (hosp performed)not at [facility]     Status: None   Collection Time: 07/21/17  6:36 AM  Result Value Ref Range   Opiates NONE DETECTED NONE DETECTED   Cocaine NONE DETECTED NONE DETECTED   Benzodiazepines NONE DETECTED NONE DETECTED   Amphetamines NONE DETECTED NONE DETECTED   Tetrahydrocannabinol NONE DETECTED NONE DETECTED   Barbiturates NONE DETECTED NONE DETECTED    Comment: (NOTE) DRUG SCREEN FOR MEDICAL PURPOSES ONLY.  IF CONFIRMATION IS NEEDED FOR ANY PURPOSE, NOTIFY LAB WITHIN 5 DAYS. LOWEST DETECTABLE LIMITS FOR URINE DRUG SCREEN Drug Class                     Cutoff (ng/mL) Amphetamine and metabolites    1000 Barbiturate and metabolites    200 Benzodiazepine                 357 Tricyclics and metabolites     300 Opiates and metabolites        300 Cocaine and metabolites         300 THC                            50 Performed at [facility], [place] [address], [place], [place] [address]     Blood Alcohol level:  No results found for: [facility]  Metabolic Disorder Labs: Lab Results  Component Value Date   HGBA1C 5.7 (H) 07/21/2017   MPG 116.89 07/21/2017   No results found for: PROLACTIN Lab Results  Component Value Date   CHOL 172 07/21/2017   TRIG 132 07/21/2017   HDL 32 (L) 07/21/2017   CHOLHDL 5.4 07/21/2017   VLDL 26 07/21/2017   LDLCALC 114 (H) 07/21/2017    Physical Findings: AIMS:  , ,  ,  ,  CIWA:    COWS:     Musculoskeletal: Strength & Muscle Tone: within normal limits Gait & Station: normal Patient leans: N/A  Psychiatric Specialty Exam: Physical Exam  Constitutional: He is oriented to person, place, and time. He appears well-developed and well-nourished.  HENT:  Head: Normocephalic and atraumatic.  Respiratory: Effort normal.  Neurological: He is alert and oriented to person, place, and time.  Psychiatric:  As above     ROS  Blood pressure 129/79, pulse 86, temperature (!) 97.2 F (36.2 C), resp. rate 18, height 5' 9"  (1.753 m), weight 82.1 kg (181 lb), SpO2 99 %.Body mass index is 26.73 kg/m.  General Appearance: neatly dressed, good relatedness. Appropriate behavior.   Eye Contact:  Good  Speech:  Clear and Coherent and Normal Rate  Volume:  Normal  Mood:  Feels better  Affect:  Appropriate and Full Range  Thought Process:  Linear  Orientation:  Full (Time, Place, and Person)  Thought Content:  No delusional theme. No preoccupation with violent thoughts. No negative ruminations. No obsession.  No hallucination in any modality.   Suicidal Thoughts:  None lately.  Homicidal Thoughts:  No  Memory:  Immediate;   Good Recent;   Good Remote;   Good  Judgement:  Good  Insight:  Good  Psychomotor Activity:  Normal  Concentration:  Concentration: Good and Attention Span: Good  Recall:  Good  Fund of  Knowledge:  Good  Language:  Good  Akathisia:  Negative  Handed:    AIMS (if indicated):     Assets:  Communication Skills Desire for Improvement Financial Resources/Insurance Housing Physical Health Resilience  ADL's:  Intact  Cognition:  WNL  Sleep:  Number of Hours: 6.75     Treatment Plan Summary: Patient is responding well to medication adjustment. He is interacting well with peers. He is not dwelling on his past relationship. No suicidal or homicidal thoughts lately. Would evaluate him further and gather collateral from his family. Hopeful discharge soon if he maintains progress.   Psychiatric: MDD  Medical:  Psychosocial:  Unemployed Still grieving the end of a relationship.   PLAN: 1. Continue current regimen 2. continue to monitor mood, behavior and interaction with peers 3. Continue to encourage unit groups and therapeutic activity     Artist Beach, MD 07/22/2017, 2:59 PM

## 2017-07-22 NOTE — Progress Notes (Signed)
Adult Psychoeducational Group Note  Date:  07/22/2017 Time:  9:57 PM  Group Topic/Focus:  Wrap-Up Group:   The focus of this group is to help patients review their daily goal of treatment and discuss progress on daily workbooks.  Participation Level:  Active  Participation Quality:  Appropriate and Attentive  Affect:  Appropriate  Cognitive:  Alert, Appropriate and Oriented  Insight: Good  Engagement in Group:  Engaged and Supportive  Modes of Intervention:  Discussion  Additional Comments:  Pt stated his goal for the day was to manage his anxiety, but he did not achieve this goal. Pt rated his day a 6.5/10 and stated today was better than yesterday. Pt stated he is excited for pet therapy on Tuesday, which he says will make his day better.  Leo GrosserMegan A Jamareon Shimel 07/22/2017, 9:57 PM

## 2017-07-23 DIAGNOSIS — G4700 Insomnia, unspecified: Secondary | ICD-10-CM

## 2017-07-23 DIAGNOSIS — R450 Nervousness: Secondary | ICD-10-CM

## 2017-07-23 DIAGNOSIS — F419 Anxiety disorder, unspecified: Secondary | ICD-10-CM

## 2017-07-23 DIAGNOSIS — F39 Unspecified mood [affective] disorder: Secondary | ICD-10-CM

## 2017-07-23 NOTE — [facility] Group Notes (Signed)
Group was facilitated and Clinical research associatewriter discussed rules and regulations of the unit, explained the schedule for the rest of the evening(phone times, med times, vitals and breakfast in the am.) Writer ask each patient how their day was on a scale of 1-10. Pt stated his was a 7. Pt stated he found some meditation that works for him and he may be leaving tomorrow. Writer spoke with patients about taking care of themselves and replacing negative thoughts with positive thoughts. Writer encourage patients to write down their accomplishments and celebrate them as they complete each one no matter how big or small. Writer spoke about setting boundaries and not feeling guilty for doing what is best for them.

## 2017-07-23 NOTE — Progress Notes (Signed)
D: Pt was in the dayroom upon initial approach.  Pt presents with anxious affect and mood.  His goal was to "find some coping mechanism for my anxiety."  He reports that "meditation" works for him.  Pt denies SI/HI, denies hallucinations, denies pain.  Pt has been visible in milieu interacting with peers and staff appropriately.  Pt attended evening group.    A: Introduced self to pt.  Actively listened to pt and offered support and encouragement. Medication administered per order.  PRN medication administered for anxiety.  Q15 minute safety checks maintained.  R: Pt is safe on the unit.  Pt is compliant with medications.  Pt verbally contracts for safety.  Will continue to monitor and assess.

## 2017-07-23 NOTE — Progress Notes (Signed)
[facility]  07/23/2017 1:25 PM Johnny Delgado  MRN:  [id number]   Subjective:  Patient reports that he is doing good today. He enjoys the groups and states that his anxiety has been low at 2/10 and his depression better at 3/10. He plans to move back in with his mom and he has discussed this with her. He states that he has a therapist, but not a psychiatrist. He denies any medication side effects. He denies any SI/HI/AVH and contracts for safety. He feels he needs to stay until Wednesday or Thursday and mainly for more groups, specifically the animal therapy because he really likes dogs.    Objective: Patient's chart and findings reviewed and discussed with treatment team. Patient presents in the day room and has been seen in there most of the day. He has been attending groups. He will continue same medications and hopeful discharge tomorrow. Will request CSW to get collateral from mother and to arrange for a psychiatrist for follow up.  Principal Problem: Severe recurrent major depression without psychotic features ([facility]) Diagnosis:   Patient Active Problem List   Diagnosis Date Noted  . Severe recurrent major depression without psychotic features ([facility]) [F33.2] 07/20/2017   Total Time spent with patient: 15 minutes  Past Psychiatric History: See H&P  Past Medical History:  Past Medical History:  Diagnosis Date  . Anxiety   . Depression    History reviewed. No pertinent surgical history. Family History: History reviewed. No pertinent family history. Family Psychiatric  History: See H&P Social History:  Social History   Substance and Sexual Activity  Alcohol Use No  . Frequency: Never     Social History   Substance and Sexual Activity  Drug Use No    Social History   Socioeconomic History  . Marital status: Single    Spouse name: None  . Number of children: None  . Years of education: None  . Highest education level: None  Social Needs  . Financial resource  strain: None  . Food insecurity - worry: None  . Food insecurity - inability: None  . Transportation needs - medical: None  . Transportation needs - non-medical: None  Occupational History  . None  Tobacco Use  . Smoking status: Never Smoker  . Smokeless tobacco: Never Used  Substance and Sexual Activity  . Alcohol use: No    Frequency: Never  . Drug use: No  . Sexual activity: No  Other Topics Concern  . None  Social History Narrative  . None   Additional Social History:    Pain Medications: See MAR Prescriptions: See MAR Over the Counter: See MAR History of alcohol / drug use?: No history of alcohol / drug abuse                    Sleep: Good  Appetite:  Good  Current Medications: Current Facility-Administered Medications  Medication Dose Route Frequency Provider Last Rate Last Dose  . acetaminophen (TYLENOL) tablet 650 mg  650 mg Oral Q6H PRN Nira ConnBerry, Jason A, NP   650 mg at 07/21/17 1817  . alum & mag hydroxide-simeth (MAALOX/MYLANTA) 200-200-20 MG/5ML suspension 30 mL  30 mL Oral Q4H PRN Nira ConnBerry, Jason A, NP      . FLUoxetine (PROZAC) capsule 40 mg  40 mg Oral Daily Izediuno, Delight OvensVincent A, MD   40 mg at 07/23/17 0743  . hydrOXYzine (ATARAX/VISTARIL) tablet 10 mg  10 mg Oral TID PRN Jackelyn PolingBerry, Jason A, NP  10 mg at 07/23/17 0744  . magnesium hydroxide (MILK OF MAGNESIA) suspension 30 mL  30 mL Oral Daily PRN Nira Conn A, NP      . traZODone (DESYREL) tablet 50 mg  50 mg Oral QHS Izediuno, Delight Ovens, MD   50 mg at 07/22/17 2224    Lab Results: No results found for this or any previous visit (from the past 48 hour(s)).  Blood Alcohol level:  No results found for: [facility]  Metabolic Disorder Labs: Lab Results  Component Value Date   HGBA1C 5.7 (H) 07/21/2017   MPG 116.89 07/21/2017   No results found for: PROLACTIN Lab Results  Component Value Date   CHOL 172 07/21/2017   TRIG 132 07/21/2017   HDL 32 (L) 07/21/2017   CHOLHDL 5.4 07/21/2017   VLDL 26  07/21/2017   LDLCALC 114 (H) 07/21/2017    Physical Findings: AIMS:  , ,  ,  ,    CIWA:    COWS:     Musculoskeletal: Strength & Muscle Tone: within normal limits Gait & Station: normal Patient leans: N/A  Psychiatric Specialty Exam: Physical Exam  Nursing note and vitals reviewed. Constitutional: He is oriented to person, place, and time. He appears well-developed and well-nourished.  Respiratory: Effort normal.  Musculoskeletal: Normal range of motion.  Neurological: He is alert and oriented to person, place, and time.  Skin: Skin is warm.    Review of Systems  Constitutional: Negative.   HENT: Negative.   Eyes: Negative.   Respiratory: Negative.   Cardiovascular: Negative.   Gastrointestinal: Negative.   Genitourinary: Negative.   Musculoskeletal: Negative.   Skin: Negative.   Neurological: Negative.   Endo/Heme/Allergies: Negative.   Psychiatric/Behavioral: Positive for depression. Negative for hallucinations and suicidal ideas. The patient is nervous/anxious.     Blood pressure 105/69, pulse (!) 108, temperature 97.7 F (36.5 C), temperature source Oral, resp. rate 16, height 5\' 9"  (1.753 m), weight 82.1 kg (181 lb), SpO2 99 %.Body mass index is 26.73 kg/m.  General Appearance: Casual  Eye Contact:  Good  Speech:  Clear and Coherent and Normal Rate  Volume:  Normal  Mood:  Euthymic  Affect:  Congruent  Thought Process:  Goal Directed and Descriptions of Associations: Intact  Orientation:  Full (Time, Place, and Person)  Thought Content:  WDL  Suicidal Thoughts:  No  Homicidal Thoughts:  No  Memory:  Immediate;   Good Recent;   Good Remote;   Good  Judgement:  Good  Insight:  Good  Psychomotor Activity:  Normal  Concentration:  Concentration: Good and Attention Span: Good  Recall:  Good  Fund of Knowledge:  Good  Language:  Good  Akathisia:  No  Handed:  Right  AIMS (if indicated):     Assets:  Communication Skills Desire for Improvement Financial  Resources/Insurance Housing Physical Health Social Support Transportation  ADL's:  Intact  Cognition:  WNL  Sleep:  Number of Hours: 6.25   Problems Addressed: MDD severe  Treatment Plan Summary: Daily contact with patient to assess and evaluate symptoms and progress in treatment, Medication management and Plan is to:  -Continue Prozac 40 mg PO Daily for mood stability -Continue Vistaril 10 mg PO TID PRN for anxiety -Continue Trazodone 50 mg PO QHS PRN for insomnia -Encourage group therapy participation  Maryfrances Bunnell, FNP 07/23/2017, 1:25 PM

## 2017-07-23 NOTE — Progress Notes (Signed)
D: Patient has been pleasant and interacts well with staff.  His main concern is his anxiety.  He states, "I want to tips to handle my anxiety.  I don't want to feel suicidal.  This is helping me a lot; talking to other people."  Patient presents with flat, anxious affect; his mood is depressed.  Patient denies any thoughts of self harm.  He is attending groups and participating in his treatment.  Patient's family will be notified today for collateral information.  A: Continue to monitor medication management and MD orders.  Safety checks completed every 15 minutes per protocol.  Offer support and encouragement as needed.  R: Patient is receptive to staff; his behavior is appropriate.

## 2017-07-23 NOTE — [facility] Group Notes (Signed)
[facility] LCSW Group Therapy Note  Date/Time: 07/23/17, 1315  Type of Therapy and Topic:  Group Therapy:  Overcoming Obstacles  Participation Level:  minimal  Description of Group:    In this group patients will be encouraged to explore what they see as obstacles to their own wellness and recovery. They will be guided to discuss their thoughts, feelings, and behaviors related to these obstacles. The group will process together ways to cope with barriers, with attention given to specific choices patients can make. Each patient will be challenged to identify changes they are motivated to make in order to overcome their obstacles. This group will be process-oriented, with patients participating in exploration of their own experiences as well as giving and receiving support and challenge from other group members.  Therapeutic Goals: 1. Patient will identify personal and current obstacles as they relate to admission. 2. Patient will identify barriers that currently interfere with their wellness or overcoming obstacles.  3. Patient will identify feelings, thought process and behaviors related to these barriers. 4. Patient will identify two changes they are willing to make to overcome these obstacles:    Summary of Patient Progress: Pt identified anxiety and depression as the main obstacles in his life currently.  Pt was distracted in group and with minimal participation in the group discussion.       Therapeutic Modalities:   Cognitive Behavioral Therapy Solution Focused Therapy Motivational Interviewing Relapse Prevention Therapy  Daleen SquibbGreg Itamar Mcgowan, LCSW

## 2017-07-23 NOTE — Progress Notes (Signed)
Adult Psychoeducational Group Note  Date:  07/23/2017 Time:  1:41 PM  Group Topic/Focus:  Wellness Toolbox:   The focus of this group is to discuss various aspects of wellness, balancing those aspects and exploring ways to increase the ability to experience wellness.  Patients will create a wellness toolbox for use upon discharge.  Participation Level:  Active  Participation Quality:  Appropriate  Affect:  Appropriate  Cognitive:  Alert and Appropriate  Insight: Appropriate, Good and Improving  Engagement in Group:  Engaged  Modes of Intervention:  Activity and Discussion  Additional Comments:  Pt did participate in all group activities and discussions today.  Adara Kittle R Lynora Dymond 07/23/2017, 1:41 PM

## 2017-07-23 NOTE — Progress Notes (Signed)
Recreation Therapy Notes  Date: 07/22/17 Time: 0930 Location: [address] Dayroom  Group Topic: Stress Management  Goal Area(s) Addresses:  Patient will verbalize importance of using healthy stress management.  Patient will identify positive emotions associated with healthy stress management.   Behavioral Response: Engaged  Intervention: Stress Management  Activity :  [employer] Meditation.  LRT played a meditation on the power of mountains to be resilient in the face of change.  Patients were to follow along with the meditation as it played.  Education:  Stress Management, Discharge Planning.   Education Outcome: Acknowledges edcuation/In group clarification offered/Needs additional education  Clinical Observations/Feedback: Pt attended group.     Caroll RancherMarjette Mosella Kasa, LRT/CTRS         Lillia AbedLindsay, Ruthellen Tippy A 07/23/2017 11:12 AM

## 2017-07-23 NOTE — [facility] Suicide Risk Assessment (Signed)
[facility] INPATIENT:  Family/Significant Other Suicide Prevention Education  Suicide Prevention Education:  Education Completed; Marrion CoyLisa Uhlig, mother, [phone], has been identified by the patient as the family member/significant other with whom the patient will be residing, and identified as the person(s) who will aid the patient in the event of a mental health crisis (suicidal ideations/suicide attempt).  With written consent from the patient, the family member/significant other has been provided the following suicide prevention education, prior to the and/or following the discharge of the patient.  The suicide prevention education provided includes the following:  Suicide risk factors  Suicide prevention and interventions  National Suicide Hotline telephone number  [facility] assessment telephone number  [facility] 911  [facility] and/or Residential Mobile Crisis Unit telephone number  Request made of family/significant other to:  Remove weapons (e.g., guns, rifles, knives), all items previously/currently identified as safety concern.  Pt father is a Therapist, nutritionalhunter, multiple guns, but they are secure in gun safe.  Remove drugs/medications (over-the-counter, prescriptions, illicit drugs), all items previously/currently identified as a safety concern.  The family member/significant other verbalizes understanding of the suicide prevention education information provided.  The family member/significant other agrees to remove the items of safety concern listed above.  Mother reports pt has been depressed for 1-2 years.  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  Recently quit a job.  Mother is concerned about pt being suicidal and safety and is asking pt directly about SI pretty regularly.  She is also a little worried about him having access to a lot of [facility] due to overdose potential.  He has had  several pretty bad anxiety attacks recently.  She has been visiting him every night and will be back tonight.  She is going to talk with him about a plan for how he will be using his time since he is not working or going to school right now.  Lorri FrederickWierda, Mikisha Roseland Jon, LCSW 07/23/2017, 10:59 AM

## 2017-07-23 NOTE — Progress Notes (Signed)
Patient ID: Rickey Barbaraolan W Baham, male   DOB: 06/05/1995, 22 y.o.   MRN: [id number]  Pt currently presents with an animated affect and anxious behavior. Pt reports to Clinical research associatewriter that he is sad that "some of my friends are leaving tomorrow." Pt endorsing emotional support from peers. Pt currently denying any SI at any point today. Pt reports good sleep with current medication regimen.   Pt provided with medications per providers orders. Pt's labs and vitals were monitored throughout the night. Pt given a 1:1 about emotional and mental status. Pt supported and encouraged to express concerns and questions. Pt educated on medications and assertiveness technqiues, encouraged to find support system post discharge. Reports he will reach out to his brother and friends.   Pt's safety ensured with 15 minute and environmental checks. Pt currently denies SI/HI and A/V hallucinations. Pt verbally agrees to seek staff if SI/HI or A/VH occurs and to consult with staff before acting on any harmful thoughts. Will continue POC.

## 2017-07-23 NOTE — Tx Team (Signed)
Interdisciplinary Treatment and Diagnostic Plan Update  07/23/2017 Time of Session: 0930 Johnny Delgado MRN: [id number]  Principal Diagnosis: Severe recurrent major depression without psychotic features [facility](HCC)  Secondary Diagnoses: Principal Problem:   Severe recurrent major depression without psychotic features (HCC)   Current Medications:  Current Facility-Administered Medications  Medication Dose Route Frequency Provider Last Rate Last Dose  . acetaminophen (TYLENOL) tablet 650 mg  650 mg Oral Q6H PRN Nira ConnBerry, Jason A, NP   650 mg at 07/21/17 1817  . alum & mag hydroxide-simeth (MAALOX/MYLANTA) 200-200-20 MG/5ML suspension 30 mL  30 mL Oral Q4H PRN Nira ConnBerry, Jason A, NP      . FLUoxetine (PROZAC) capsule 40 mg  40 mg Oral Daily Izediuno, Delight OvensVincent A, MD   40 mg at 07/23/17 0743  . hydrOXYzine (ATARAX/VISTARIL) tablet 10 mg  10 mg Oral TID PRN Jackelyn PolingBerry, Jason A, NP   10 mg at 07/23/17 0744  . magnesium hydroxide (MILK OF MAGNESIA) suspension 30 mL  30 mL Oral Daily PRN Nira ConnBerry, Jason A, NP      . traZODone (DESYREL) tablet 50 mg  50 mg Oral QHS Izediuno, Delight OvensVincent A, MD   50 mg at 07/22/17 2224   PTA Medications: Medications Prior to Admission  Medication Sig Dispense Refill Last Dose  . FLUoxetine (PROZAC) 20 MG capsule Take 20 mg by mouth daily.     Marland Kitchen. ibuprofen (ADVIL,MOTRIN) 400 MG tablet Take 400 mg by mouth every 6 (six) hours as needed for headache or mild pain.       Patient Stressors: Medication change or noncompliance Other: ongoing depression and suicidal ideation  Patient Strengths: Ability for insight Average or above average intelligence Communication skills Motivation for treatment/growth Supportive family/friends  Treatment Modalities: Medication Management, Group therapy, Case management,  1 to 1 session with clinician, Psychoeducation, Recreational therapy.   Physician Treatment Plan for Primary Diagnosis: Severe recurrent major depression without psychotic features  (HCC) Long Term Goal(s): Improvement in symptoms so as ready for discharge Improvement in symptoms so as ready for discharge   Short Term Goals: Ability to identify changes in lifestyle to reduce recurrence of condition will improve Ability to verbalize feelings will improve Ability to disclose and discuss suicidal ideas Ability to demonstrate self-control will improve Ability to identify and develop effective coping behaviors will improve Ability to maintain clinical measurements within normal limits will improve Compliance with prescribed medications will improve Ability to identify changes in lifestyle to reduce recurrence of condition will improve Ability to verbalize feelings will improve Ability to disclose and discuss suicidal ideas Ability to demonstrate self-control will improve Ability to identify and develop effective coping behaviors will improve Ability to maintain clinical measurements within normal limits will improve Compliance with prescribed medications will improve  Medication Management: Evaluate patient's response, side effects, and tolerance of medication regimen.  Therapeutic Interventions: 1 to 1 sessions, Unit Group sessions and Medication administration.  Evaluation of Outcomes: Progressing  Physician Treatment Plan for Secondary Diagnosis: Principal Problem:   Severe recurrent major depression without psychotic features (HCC)  Long Term Goal(s): Improvement in symptoms so as ready for discharge Improvement in symptoms so as ready for discharge   Short Term Goals: Ability to identify changes in lifestyle to reduce recurrence of condition will improve Ability to verbalize feelings will improve Ability to disclose and discuss suicidal ideas Ability to demonstrate self-control will improve Ability to identify and develop effective coping behaviors will improve Ability to maintain clinical measurements within normal limits will improve Compliance with  prescribed medications will improve Ability to identify changes in lifestyle to reduce recurrence of condition will improve Ability to verbalize feelings will improve Ability to disclose and discuss suicidal ideas Ability to demonstrate self-control will improve Ability to identify and develop effective coping behaviors will improve Ability to maintain clinical measurements within normal limits will improve Compliance with prescribed medications will improve     Medication Management: Evaluate patient's response, side effects, and tolerance of medication regimen.  Therapeutic Interventions: 1 to 1 sessions, Unit Group sessions and Medication administration.  Evaluation of Outcomes: Progressing   RN Treatment Plan for Primary Diagnosis: Severe recurrent major depression without psychotic features (HCC) Long Term Goal(s): Knowledge of disease and therapeutic regimen to maintain health will improve  Short Term Goals: Ability to identify and develop effective coping behaviors will improve and Compliance with prescribed medications will improve  Medication Management: RN will administer medications as ordered by provider, will assess and evaluate patient's response and provide education to patient for prescribed medication. RN will report any adverse and/or side effects to prescribing provider.  Therapeutic Interventions: 1 on 1 counseling sessions, Psychoeducation, Medication administration, Evaluate responses to treatment, Monitor vital signs and CBGs as ordered, Perform/monitor CIWA, COWS, AIMS and Fall Risk screenings as ordered, Perform wound care treatments as ordered.  Evaluation of Outcomes: Progressing   LCSW Treatment Plan for Primary Diagnosis: Severe recurrent major depression without psychotic features (HCC) Long Term Goal(s): Safe transition to appropriate next level of care at discharge, Engage patient in therapeutic group addressing interpersonal concerns.  Short Term Goals:  Engage patient in aftercare planning with referrals and resources, Increase social support and Increase skills for wellness and recovery  Therapeutic Interventions: Assess for all discharge needs, 1 to 1 time with Social worker, Explore available resources and support systems, Assess for adequacy in community support network, Educate family and significant other(s) on suicide prevention, Complete Psychosocial Assessment, Interpersonal group therapy.  Evaluation of Outcomes: Progressing   Progress in Treatment: Attending groups: Yes. Participating in groups: Yes. Taking medication as prescribed: Yes. Toleration medication: Yes. Family/Significant other contact made: Yes, individual(s) contacted:  mother Patient understands diagnosis: Yes. Discussing patient identified problems/goals with staff: Yes. Medical problems stabilized or resolved: Yes. Denies suicidal/homicidal ideation: Yes. Issues/concerns per patient self-inventory: No. Other: none  New problem(s) identified: No, Describe:  none  New Short Term/Long Term Goal(s):Pt goal: "To not feel suicidal, learn better ways to manage my depression."  Discharge Plan or Barriers:   Reason for Continuation of Hospitalization: Depression, Medication stabilization  Estimated Length of Stay: 1-2 days.    Attendees: Patient:Johnny Delgado 07/23/2017   Physician: Dr Jackquline Berlin, MD 07/23/2017   Nursing: Joslyn Devon, RN 07/23/2017   RN Care Manager: 07/23/2017   Social Worker: Daleen Squibb, LCSW 07/23/2017   Recreational Therapist:  07/23/2017   Other:  07/23/2017   Other:  07/23/2017   Other: 07/23/2017     Scribe for Treatment Team: Lorri Frederick, LCSW 07/23/2017 2:28 PM

## 2017-07-24 NOTE — Plan of Care (Signed)
  Progressing Activity: Sleeping patterns will improve 07/24/2017 [id number] - Progressing by Arrie Aranhurch, Catrell Morrone J, RN Note Pt slept 6.75 hours last night.

## 2017-07-24 NOTE — Progress Notes (Signed)
Adult Psychoeducational Group Note  Date:  07/24/2017 Time:  1615 Group Topic/Focus:  Overcoming Stress:   The focus of this group is to define stress and help patients assess their triggers.  Participation Level:  Active  Participation Quality:  Appropriate  Affect:  Appropriate  Cognitive:  Appropriate  Insight: Appropriate  Engagement in Group:  Engaged  Modes of Intervention:  Discussion, Education and Support  Additional Comments:  Group focus on grounding exercises  Gwenevere Ghazili, Ayano Douthitt Patience 07/24/2017, 5:03 PM

## 2017-07-24 NOTE — Progress Notes (Signed)
Pt presents with an anxious mood. Pt reports decreased symptoms of depression and anxiety today. Pt rates depression 5/10. Anxiety 4/10. Pt denies SI/HI. Pt verbalized to Clinical research associatewriter that he was told by the CSW and NP yesterday that the MD would meet with him yesterday to discuss a possibility of discharging today but never did meet with him. Tx team made aware of pt concerns. Verbal support provided. Pt encouraged to attend groups. 15 minute checks performed for safety.

## 2017-07-24 NOTE — Progress Notes (Signed)
Adult Psychoeducational Group Note  Date:  07/24/2017 Time:  1515 Group Topic/Focus:  Relaxation activities.   The purpose of this group is to help patients identify activities for coping with anxiety and depression.   Participation Level:  Active  Participation Quality:  Appropriate  Affect:  Appropriate  Cognitive:  Appropriate  Insight: Appropriate  Engagement in Group:  Engaged  Modes of Intervention:  Activity  Additional Comments:    Carmesha Morocco L 07/24/2017  

## 2017-07-24 NOTE — [facility] Group Notes (Signed)
[facility] LCSW Group Therapy Note  Date/Time: 07/24/17, 1315  Type of Therapy/Topic:  Group Therapy:  Feelings about Diagnosis  Participation Level:  Active   Mood:anxious   Description of Group:    This group will allow patients to explore their thoughts and feelings about diagnoses they have received. Patients will be guided to explore their level of understanding and acceptance of these diagnoses. Facilitator will encourage patients to process their thoughts and feelings about the reactions of others to their diagnosis, and will guide patients in identifying ways to discuss their diagnosis with significant others in their lives. This group will be process-oriented, with patients participating in exploration of their own experiences as well as giving and receiving support and challenge from other group members.   Therapeutic Goals: 1. Patient will demonstrate understanding of diagnosis as evidence by identifying two or more symptoms of the disorder:  2. Patient will be able to express two feelings regarding the diagnosis 3. Patient will demonstrate ability to communicate their needs through discussion and/or role plays  Summary of Patient Progress:Pt very active in group discussion today regarding diagnoses, symptoms, and mental health stigma.  Halfway through group, pt appeared to experience a panic attack.  Group was stopped and RN/MHT came and checked vital signs.  Pt initially indicated he could not get up or walk but was eventually able to do so.  Group restarted and pt rejoined the group at the end and appeared to be better.          Therapeutic Modalities:   Cognitive Behavioral Therapy Brief Therapy Feelings Identification   Johnny SquibbGreg Anara Cowman, LCSW

## 2017-07-24 NOTE — Progress Notes (Signed)
Patient was experiencing an intense moment of anxiety causing his to feel a state of panic.  Patient's vital signs were taking and found to be within normal limits with an exception of a slight elevation of his heart rate.  Patient reported feeling unable to move and stuck in his chair but was able to be assisted to his room by staff.  Patient was unable to identify a precipitating factor in this event, but was able to use music as a coping skill to help ease the intensity of his anxiety. Once patient was calm he was offered prn anxiety medications and was able to rejoin group.

## 2017-07-24 NOTE — Plan of Care (Signed)
  Progressing Spiritual Needs Ability to function at adequate level 07/24/2017 1136 - Progressing by Layla BarterWhite, Tirzah Fross L, RN Activity: Interest or engagement in activities will improve 07/24/2017 1136 - Progressing by Layla BarterWhite, Bentlie Catanzaro L, RN Education: Mental status will improve 07/24/2017 1136 - Progressing by Layla BarterWhite, Chantil Bari L, RN Coping: Ability to demonstrate self-control will improve 07/24/2017 1136 - Progressing by Layla BarterWhite, Jahnai Slingerland L, RN

## 2017-07-24 NOTE — Progress Notes (Signed)
Recreation Therapy Notes  Date: 2.19.19 Time: 2:45 pm  [facility]   AAA/T Program Assumption of Risk Form signed by Patient/ or Parent Legal Guardian Yes  Patient is free of allergies or sever asthma Yes  Patient reports no fear of animals Yes  Patient reports no history of cruelty to animals Yes  Patient understands his/her participation is voluntary Yes  Patient washes hands before animal contact Yes  Patient washes hands after animal contact Yes  Behavioral Response: Engaged   Education:Hand Washing, Appropriate Animal Interaction   Education Outcome: Acknowledges education.   Clinical Observations/Feedback: Patient attended session and interacted appropriately with therapy dog and peers. Patient asked appropriate questions about therapy dog and his training. Patient shared stories about their pets at home with group.   Sheryle Hailarian Gaylene Moylan, Recreation Therapy Intern   Sheryle HailDarian Ciarra Braddy 07/24/2017 3:02 PM

## 2017-07-24 NOTE — Progress Notes (Signed)
[facility] MD Progress Note  07/24/2017 9:01 AM Johnny Delgado  MRN:  [id number]   Subjective:  Reports partial improvement of mood and anxiety symptoms. States he notices he feels more like his usual or old self , but continues to report some depression. Denies suicidal ideations. Denies medication side effects.   Objective: I have discussed case with treatment team and have met with patient. 22 year old male , lives with family, presented to hospital due to increased anxiety and depression, SI of walking into traffic. States he has experienced a series of losses and stressors over the last year or so which have contributed to his depression- his parents separated and father moved out of state, grandparent passed away, broke up with SO . States he had started Prozac a few weeks prior to his admission, and it was recently titrated up from 20 mgrs daily to 40 mgrs daily. He denies medication side effects and states he does feel current higher dose is helping .  Behavior on unit in good control, going to groups, visible in milieu.   Principal Problem: Severe recurrent major depression without psychotic features ([place]) Diagnosis:   Patient Active Problem List   Diagnosis Date Noted  . Severe recurrent major depression without psychotic features ([place]) [F33.2] 07/20/2017   Total Time spent with patient: 20 minutes  Past Psychiatric History: See H&P  Past Medical History:  Past Medical History:  Diagnosis Date  . Anxiety   . Depression    History reviewed. No pertinent surgical history. Family History: History reviewed. No pertinent family history. Family Psychiatric  History: See H&P Social History:  Social History   Substance and Sexual Activity  Alcohol Use No  . Frequency: Never     Social History   Substance and Sexual Activity  Drug Use No    Social History   Socioeconomic History  . Marital status: Single    Spouse name: None  . Number of children: None  . Years of  education: None  . Highest education level: None  Social Needs  . Financial resource strain: None  . Food insecurity - worry: None  . Food insecurity - inability: None  . Transportation needs - medical: None  . Transportation needs - non-medical: None  Occupational History  . None  Tobacco Use  . Smoking status: Never Smoker  . Smokeless tobacco: Never Used  Substance and Sexual Activity  . Alcohol use: No    Frequency: Never  . Drug use: No  . Sexual activity: No  Other Topics Concern  . None  Social History Narrative  . None   Additional Social History:    Pain Medications: See MAR Prescriptions: See MAR Over the Counter: See MAR History of alcohol / drug use?: No history of alcohol / drug abuse  Sleep: Good  Appetite:  Good  Current Medications: Current Facility-Administered Medications  Medication Dose Route Frequency Provider Last Rate Last Dose  . acetaminophen (TYLENOL) tablet 650 mg  650 mg Oral Q6H PRN Lindon Romp A, NP   650 mg at 07/21/17 1817  . alum & mag hydroxide-simeth (MAALOX/MYLANTA) 200-200-20 MG/5ML suspension 30 mL  30 mL Oral Q4H PRN Lindon Romp A, NP      . FLUoxetine (PROZAC) capsule 40 mg  40 mg Oral Daily Izediuno, Laruth Bouchard, MD   40 mg at 07/24/17 0802  . hydrOXYzine (ATARAX/VISTARIL) tablet 10 mg  10 mg Oral TID PRN Rozetta Nunnery, NP   10 mg at 07/23/17 2201  .  magnesium hydroxide (MILK OF MAGNESIA) suspension 30 mL  30 mL Oral Daily PRN Lindon Romp A, NP      . traZODone (DESYREL) tablet 50 mg  50 mg Oral QHS Izediuno, Laruth Bouchard, MD   50 mg at 07/23/17 2201    Lab Results: No results found for this or any previous visit (from the past 48 hour(s)).  Blood Alcohol level:  No results found for: [facility]  Metabolic Disorder Labs: Lab Results  Component Value Date   HGBA1C 5.7 (H) 07/21/2017   MPG 116.89 07/21/2017   No results found for: PROLACTIN Lab Results  Component Value Date   CHOL 172 07/21/2017   TRIG 132 07/21/2017   HDL 32  (L) 07/21/2017   CHOLHDL 5.4 07/21/2017   VLDL 26 07/21/2017   LDLCALC 114 (H) 07/21/2017    Physical Findings: AIMS:  , ,  ,  ,    CIWA:    COWS:     Musculoskeletal: Strength & Muscle Tone: within normal limits Gait & Station: normal Patient leans: N/A  Psychiatric Specialty Exam: Physical Exam  Nursing note and vitals reviewed. Constitutional: He is oriented to person, place, and time. He appears well-developed and well-nourished.  Respiratory: Effort normal.  Musculoskeletal: Normal range of motion.  Neurological: He is alert and oriented to person, place, and time.  Skin: Skin is warm.    Review of Systems  Constitutional: Negative.   HENT: Negative.   Eyes: Negative.   Respiratory: Negative.   Cardiovascular: Negative.   Gastrointestinal: Negative.   Genitourinary: Negative.   Musculoskeletal: Negative.   Skin: Negative.   Neurological: Negative.   Endo/Heme/Allergies: Negative.   Psychiatric/Behavioral: Positive for depression. Negative for hallucinations and suicidal ideas. The patient is nervous/anxious.   denies headache, denies chest pain, no dyspnea, no vomiting   Blood pressure 114/74, pulse 89, temperature 97.7 F (36.5 C), temperature source Oral, resp. rate 18, height _0  (1.753 m), weight 82.1 kg (181 lb), SpO2 99 %.Body mass index is 26.73 kg/m.  General Appearance: Well Groomed  Eye Contact:  Good  Speech:  Normal Rate  Volume:  Normal  Mood:  reports partial improvement, less depressed   Affect:  Appropriate and vaguely constricted   Thought Process:  Linear and Descriptions of Associations: Intact  Orientation:  Other:  fully alert and attentive   Thought Content:  no hallucinations, no delusions, not internally preoccupied   Suicidal Thoughts:  No today denies suicidal or self injurious ideations, contracts for safety on unit, denies homicidal or violent ideations   Homicidal Thoughts:  No  Memory:  recent and remote grossly intact    Judgement:  Other:  improving   Insight:  improving   Psychomotor Activity:  Normal  Concentration:  Concentration: Good and Attention Span: Good  Recall:  Good  Fund of Knowledge:  Good  Language:  Good  Akathisia:  No  Handed:  Right  AIMS (if indicated):     Assets:  Communication Skills Desire for Improvement Financial Resources/Insurance Housing Physical Health Social Support Transportation  ADL's:  Intact  Cognition:  WNL  Sleep:  Number of Hours: 6.75   Assessment - patient reports partially improved mood , although endorses some ongoing sadness and ruminations about losses and stressors. He denies suicidal ideations and is future oriented, speaking of plans of working or attending community college while he awaits returning to college to study film. Reports Prozac is well tolerated and feels it is helping .  Treatment Plan Summary:  Treatment Plan reviewed as below today 2/19 Daily contact with patient to assess and evaluate symptoms and progress in treatment, Medication management and Plan is to:  -Continue Prozac 40 mg QDAY for depression, anxiety  -Continue Vistaril 10 mg PO TID PRN for anxiety -Continue Trazodone 50 mg PO QHS PRN for insomnia -Encourage group therapy participation to work on coping skills and symptom reduction -Treatment team working on disposition plans- he is expressing interest in going to IOP after discharge   Jenne Campus, MD 07/24/2017, 9:01 AM   Patient ID: [name], male   DOB: 1995-06-26, 22 y.o.   MRN: [id number]

## 2017-07-24 NOTE — Progress Notes (Addendum)
Adult Psychoeducational Group Note  Date:  07/24/2017 Time:  9:47 PM  Group Topic/Focus:  Wrap-Up Group:   The focus of this group is to help patients review their daily goal of treatment and discuss progress on daily workbooks.  Participation Level:  Active  Participation Quality:  Appropriate  Affect:  Appropriate  Cognitive:  Alert  Insight: Appropriate  Engagement in Group:  Engaged  Modes of Intervention:  Discussion  Additional Comments:  Patient stated having a good day. Patient's goal for today was to get anxiety under control. Patient stated when he was trying to come up with coping skills, he had a panic attack.   Florine Sprenkle L Ulises Wolfinger 07/24/2017, 9:47 PM

## 2017-07-24 NOTE — Progress Notes (Signed)
Adult Psychoeducational Group Note  Date:  07/24/2017 Time:  0830 Group Topic/Focus:  Goals Group:   The focus of this group is to help patients establish daily goals to achieve during treatment and discuss how the patient can incorporate goal setting into their daily lives to aide in recovery.  Participation Level:  Active  Participation Quality:  Appropriate  Affect:  Appropriate  Cognitive:  Appropriate  Insight: Appropriate  Engagement in Group:  Engaged  Modes of Intervention:  Discussion and Orientation  Additional Comments:    Johnny Delgado, [name] 07/24/2017, 10:34 AM

## 2017-07-25 ENCOUNTER — Ambulatory Visit (HOSPITAL_COMMUNITY): Payer: No Typology Code available for payment source

## 2017-07-25 MED ORDER — HYDROXYZINE HCL 10 MG PO TABS: 10.0000 mg | ORAL_TABLET | Freq: Three times a day (TID) | ORAL | 0 refills | Status: DC | PRN

## 2017-07-25 MED ORDER — TRAZODONE HCL 50 MG PO TABS: 50.0000 mg | ORAL_TABLET | Freq: Every day | ORAL | 0 refills | Status: DC

## 2017-07-25 MED ORDER — FLUOXETINE HCL 40 MG PO CAPS: 40.0000 mg | ORAL_CAPSULE | Freq: Every day | ORAL | 0 refills | Status: DC

## 2017-07-25 NOTE — Discharge Summary (Addendum)
Physician Discharge Summary Note  Patient:  Johnny Delgado is an 21 y.o., male MRN:  [id number] DOB:  08-16-1995 Patient phone:  [phone] (home)  Patient address:   [address] [place] [address],  Total Time spent with patient: 20 minutes  Date of Admission:  07/20/2017 Date of Discharge: 07/25/17  Reason for Admission:  Worsening depression with SI  Principal Problem: Severe recurrent major depression without psychotic features [facility]) Discharge Diagnoses: Patient Active Problem List   Diagnosis Date Noted  . Severe recurrent major depression without psychotic features ([facility]) [F33.2] 07/20/2017    Past Psychiatric History: History of unipolar depression.  No past history of mania. No past history of psychosis. No past history of suicidal attempt. No past history of violent behavior. He has never had any inpatient treatment in the past. He was started on Prozac 20 mg two months ago. He has not been tried on any other medication in the past. He has been in therapy for about three months. No past physical treatment.   Past Medical History:  Past Medical History:  Diagnosis Date  . Anxiety   . Depression    History reviewed. No pertinent surgical history. Family History: History reviewed. No pertinent family history. Family Psychiatric  History: Denies any family history of mental illness, substance use disorder or suicide.    Social History:  Social History   Substance and Sexual Activity  Alcohol Use No  . Frequency: Never     Social History   Substance and Sexual Activity  Drug Use No    Social History   Socioeconomic History  . Marital status: Single    Spouse name: None  . Number of children: None  . Years of education: None  . Highest education level: None  Social Needs  . Financial resource strain: None  . Food insecurity - worry: None  . Food insecurity - inability: None  . Transportation needs - medical: None  . Transportation needs -  non-medical: None  Occupational History  . None  Tobacco Use  . Smoking status: Never Smoker  . Smokeless tobacco: Never Used  Substance and Sexual Activity  . Alcohol use: No    Frequency: Never  . Drug use: No  . Sexual activity: No  Other Topics Concern  . None  Social History Narrative  . None    Hospital Course:   07/21/17 Johnny Delgado Assessment: 22 y.o Caucasian male, single, lives with his family, unemployed, dropped out of college. Background history of MDD. Presented to the unit unaccompanied. Says his mother dropped him off. Reports being depressed and overwhelmed in the past six months. Guarded about his stressors at presentation. States that he has been thinking of dying for a while. He recently attempted to walk into traffic. Routine labs shows thrombocytopenia. No UDS or BAL on file. At interview, patient reports that he has been depressed since he was 22 years of age. He was started on Prozac two months ago. Says current episode started six months ago just after his last relationship ended. He has had off and on suicidal thoughts. Says in the past couple of days it has been getting worse. Says he walked into [employer] on Thursday night. He sat at waited for a car to hit him. Says his friends talked him out of it. Tells me that he is not motivated to do anything. He plays video games with his friends but feels he is passive. Says he does not get into sleep  until 2-3 AM. His mind ruminates about negative things that has happened in the past. Says he is able to think clearly. He does not have any difficulty processing information. He has normal appetite. He has not noticed any changes in his weight. Denies use of any substance. No associated psychosis. No evidence of mania. No overwhelming anxiety. No evidence of PTSD. No thoughts of harming others. No thoughts of violence. No access to weapons.  Denies any stressors at home. No financial constraints. No relational difficulties. No  legal issues  Patient remained on the [facility] unit for 4 days and stabilized with medication and therapy. Patient was started on Prozac and titrated to 40 mg Daily, Trazodone 50 mg QHS PRN, and Vistaril 10 mg TID PRN. Patient showed improvement with improved mood, affect, sleep, appetite, and interaction. Patient was seen in the day room interacting with peers and staff appropriately. Patient was seen attending groups and participating. He denies any SI/HI/AVH and contracts for safety,. He agrees to follow up at IOP services and [facility]. He is provided with prescriptions for his medications upon discharge.     Physical Findings: AIMS:  , ,  ,  ,    CIWA:    COWS:     Musculoskeletal: Strength & Muscle Tone: within normal limits Gait & Station: normal Patient leans: N/A  Psychiatric Specialty Exam: Physical Exam  Nursing note and vitals reviewed. Constitutional: He is oriented to person, place, and time. He appears well-developed and well-nourished.  Cardiovascular: Normal rate.  Respiratory: Effort normal.  Musculoskeletal: Normal range of motion.  Neurological: He is alert and oriented to person, place, and time.  Skin: Skin is warm.    Review of Systems  Constitutional: Negative.   HENT: Negative.   Eyes: Negative.   Respiratory: Negative.   Cardiovascular: Negative.   Gastrointestinal: Negative.   Genitourinary: Negative.   Musculoskeletal: Negative.   Skin: Negative.   Neurological: Negative.   Endo/Heme/Allergies: Negative.   Psychiatric/Behavioral: Negative.     Blood pressure 103/89, pulse 97, temperature (!) 97.5 F (36.4 C), temperature source Oral, resp. rate 18, height 5\' 9"  (1.753 m), weight 82.1 kg (181 lb), SpO2 99 %.Body mass index is 26.73 kg/m.  General Appearance: Casual  Eye Contact:  Good  Speech:  Clear and Coherent and Normal Rate  Volume:  Normal  Mood:  Euthymic  Affect:  Congruent  Thought Process:  Goal Directed and  Descriptions of Associations: Intact  Orientation:  Full (Time, Place, and Person)  Thought Content:  WDL  Suicidal Thoughts:  No  Homicidal Thoughts:  No  Memory:  Immediate;   Good Recent;   Good Remote;   Good  Judgement:  Good  Insight:  Good  Psychomotor Activity:  Normal  Concentration:  Concentration: Good and Attention Span: Good  Recall:  Good  Fund of Knowledge:  Good  Language:  Good  Akathisia:  No  Handed:  Right  AIMS (if indicated):     Assets:  Communication Skills Desire for Improvement Financial Resources/Insurance Housing Physical Health Social Support Transportation  ADL's:  Intact  Cognition:  WNL  Sleep:  Number of Hours: 6.5     Have you used any form of tobacco in the last 30 days? (Cigarettes, Smokeless Tobacco, Cigars, and/or Pipes): No  Has this patient used any form of tobacco in the last 30 days? (Cigarettes, Smokeless Tobacco, Cigars, and/or Pipes) Yes, No  Blood Alcohol level:  No results found for: [facility]  Metabolic Disorder  Labs:  Lab Results  Component Value Date   HGBA1C 5.7 (H) 07/21/2017   MPG 116.89 07/21/2017   No results found for: PROLACTIN Lab Results  Component Value Date   CHOL 172 07/21/2017   TRIG 132 07/21/2017   HDL 32 (L) 07/21/2017   CHOLHDL 5.4 07/21/2017   VLDL 26 07/21/2017   LDLCALC 114 (H) 07/21/2017    See Psychiatric Specialty Exam and Suicide Risk Assessment completed by Attending Physician prior to discharge.  Discharge destination:  Home  Is patient on multiple antipsychotic therapies at discharge:  No   Has Patient had three or more failed trials of antipsychotic monotherapy by history:  No  Recommended Plan for Multiple Antipsychotic Therapies: NA   Allergies as of 07/25/2017      Reactions   Banana Diarrhea      Medication List    STOP taking these medications   ibuprofen 400 MG tablet Commonly known as:  ADVIL,MOTRIN     TAKE these medications     Indication  FLUoxetine 40 MG  capsule Commonly known as:  PROZAC Take 1 capsule (40 mg total) by mouth daily. For mood control Start taking on:  07/26/2017 What changed:    medication strength  how much to take  additional instructions  Indication:  mood stability   hydrOXYzine 10 MG tablet Commonly known as:  ATARAX/VISTARIL Take 1 tablet (10 mg total) by mouth 3 (three) times daily as needed for anxiety.  Indication:  Feeling Anxious   traZODone 50 MG tablet Commonly known as:  DESYREL Take 1 tablet (50 mg total) by mouth at bedtime.  Indication:  Trouble Sleeping      Follow-up Information    [facility] Follow up.   Contact information: [address] [place] [address] [phone]        [facility]. Go on 07/26/2017.   Specialty:  Behavioral Health Why:  Please attend your intake assessment for the IOP program on Thursday, 07/26/17, at 1:30pm. Contact information: [address] [place] [id number] [phone]          Follow-up recommendations:  Continue activity as tolerated. Continue diet as recommended by your PCP. Ensure to keep all appointments with outpatient providers.  Comments:  Patient is instructed prior to discharge to: Take all medications as prescribed by his/her mental healthcare provider. Report any adverse effects and or reactions from the medicines to his/her outpatient provider promptly. Patient has been instructed & cautioned: To not engage in alcohol and or illegal drug use while on prescription medicines. In the event of worsening symptoms, patient is instructed to call the crisis hotline, 911 and or go to the nearest ED for appropriate evaluation and treatment of symptoms. To follow-up with his/her primary care provider for your other medical issues, concerns and or health care needs.    Signed: Gerlene Burdockravis B Money, FNP 07/25/2017, 9:45 AM   Reviewed and  agree with assessment and plan. Nehemiah MassedFernando Jerelle Virden, Delgado

## 2017-07-25 NOTE — BHH Suicide Risk Assessment (Signed)
[facility] Discharge Suicide Risk Assessment   Principal Problem: Severe recurrent major depression without psychotic features [facility]) Discharge Diagnoses:  Patient Active Problem List   Diagnosis Date Noted  . Severe recurrent major depression without psychotic features (H[facility]) [F33.2] 07/20/2017    Total Time spent with patient: 30 minutes  Musculoskeletal: Strength & Muscle Tone: within normal limits Gait & Station: normal Patient leans: N/A  Psychiatric Specialty Exam: ROS mild headache, no chest pain, no shortness of breath, no vomiting   Blood pressure 103/89, pulse 97, temperature (!) 97.5 F (36.4 C), temperature source Oral, resp. rate 18, height 5\' 9"  (1.753 m), weight 82.1 kg (181 lb), SpO2 99 %.Body mass index is 26.73 kg/m.  General Appearance: Well Groomed  Eye Contact::  Good  Speech:  Normal Rate409  Volume:  Normal  Mood:  reports he is feeling better, states " my mood is pretty good "  Affect:  Appropriate and reactive, less anxious   Thought Process:  Linear and Descriptions of Associations: Intact  Orientation:  Full (Time, Place, and Person)  Thought Content:  denies hallucinations, no delusions, not internally preoccupied  Suicidal Thoughts:  No denies any suicidal or self injurious ideations, denies any homicidal ideations  Homicidal Thoughts:  No  Memory:  recent and remote grossly intact   Judgement:  Other:  improved  Insight:  improving  Psychomotor Activity:  Normal  Concentration:  Good  Recall:  Good  Fund of Knowledge:Good  Language: Good  Akathisia:  Negative  Handed:  Right  AIMS (if indicated):     Assets:  Communication Skills Desire for Improvement Resilience  Sleep:  Number of Hours: 6.5  Cognition: WNL  ADL's:  Intact   Mental Status Per Nursing Assessment::   On Admission:     Demographic Factors:  22 year old single male, lives with family, currently unemployed   Loss Factors: Parents separated lat year , father moved out of  state, grandparent passed away 2 years ago, broke up with GF last year   Historical Factors: No prior psychiatric admissions, no history of prior suicide attempts, history of anxiety  Risk Reduction Factors:   Sense of responsibility to family, Religious beliefs about death, Living with another person, especially a relative, Positive social support and Positive coping skills or problem solving skills  Continued Clinical Symptoms:  At this time patient is alert, attentive, well related, pleasant, reports mood is improved compared to prior to admission, affect is appropriate, reactive, less anxious at this time, no thought disorder, no suicidal or self injurious ideations, no homicidal ideations, no psychotic symptoms. Reports he had a panic attack yesterday, but that at this time he is feeling better, without significant anxiety. Behavior on unit in good control, pleasant on approach. Tolerating Prozac well, we reviewed side effects, including potential sexual side effects and potential risk of increased suicidal ideations in young adults early in treatment with antidepressant medications. We also reviewed potential for sedation from Vistaril PRNs and recommended not to drive if feeling sedated .  Cognitive Features That Contribute To Risk:  No gross cognitive deficits noted upon discharge. Is alert , attentive, and oriented x 3   Suicide Risk:  Mild:  Suicidal ideation of limited frequency, intensity, duration, and specificity.  There are no identifiable plans, no associated intent, mild dysphoria and related symptoms, good self-control (both objective and subjective assessment), few other risk factors, and identifiable protective factors, including available and accessible social support.  Follow-up Information    [facility] Follow up.   Contact information: [address] [place] [address] [phone]        [facility]. Go on 07/26/2017.   Specialty:  Behavioral Health Why:  Please attend your intake assessment for the IOP program on Thursday, 07/26/17, at 1:30pm. Contact information: [address] [place] [id number] [phone]          Plan Of Care/Follow-up recommendations:  Activity:  as tolerated  Diet:  regular Tests:  NA Other:  See below  Patient is expressing readiness for discharge and is leaving unit in good spirits  Plans to return home Follow up as above   Craige CottaFernando A Andersson L[place], MD 07/25/2017, 9:24 AM

## 2017-07-25 NOTE — Progress Notes (Signed)
Pt received both written and verbal discharge instructions. Pt verbalized understanding of discharge instructions. Pt agreed to f/u appt and med regimen. Pt received d/c packet with forms and prescriptions. Pt gathered belongings from room and locker. Pt safely discharge to the lobby.

## 2017-07-25 NOTE — Progress Notes (Signed)
Adult Psychoeducational Group Note  Date:  07/25/2017 Time: 0830  Group Topic/Focus:  Goals Group:   The focus of this group is to help patients establish daily goals to achieve during treatment and discuss how the patient can incorporate goal setting into their daily lives to aide in recovery.  Participation Level:  Active  Participation Quality:  Appropriate  Affect:  Appropriate  Cognitive:  Appropriate  Insight: Appropriate  Engagement in Group:  Engaged  Modes of Intervention:  Discussion, Orientation, and rappor-building    Additional Comments:  Pt attended orientation/goal group  Gwenevere Ghazili, Kaiden Pech Patience 07/25/2017, 9:59 AM

## 2017-07-25 NOTE — Progress Notes (Signed)
  [facility] Discharge [facility] :  Will you be returning to the same living situation after discharge:  Yes,  with mother At discharge, do you have transportation home?: Yes,  mother Do you have the ability to pay for your medications: Yes,  mag[place]in  Release of information consent forms completed and in the chart;  Patient's signature needed at discharge.  Patient to Follow up at: Follow-up Information    [facility]. Go on 07/26/2017.   Specialty:  Behavioral Health Why:  Please attend your intake assessment for the IOP program on Thursday, 07/26/17, at 1:30pm. Contact information: [address] [place] [id number] [phone]          Next level of care provider has [facility]:yes  Safety Planning and Suicide Prevention discussed: Yes,  with mother  Have you used any form of tobacco in the last 30 days? (Cigarettes, Smokeless Tobacco, Cigars, and/or Pipes): No  Has patient been referred to the Quitline?: N/A patient is not a smoker  Patient has been referred for addiction treatment: Yes  Lorri FrederickWierda, Mollie Rossano Jon, LCSW 07/25/2017, 11:08 AM

## 2017-07-25 NOTE — Progress Notes (Signed)
D: Pt was in the dayroom upon initial approach.  Pt presents with anxious affect and mood.  He reports his day was "good, I had a panic attack, they're very random.  One of the nurses came to my room and played some music for me and that helped."  Pt denies SI/HI, denies hallucinations, denies pain.  Pt has been visible in milieu interacting with peers and staff appropriately.  Pt attended evening group.    A: Introduced self to pt.  Actively listened to pt and offered support and encouragement. Medication administered per order.  PRN medication administered for anxiety.  Q15 minute safety checks maintained.  R: Pt is safe on the unit.  Pt is compliant with medications.  Pt verbally contracts for safety.  Will continue to monitor and assess.

## 2017-07-26 ENCOUNTER — Other Ambulatory Visit (HOSPITAL_COMMUNITY): Payer: BLUE CROSS/BLUE SHIELD | Attending: Psychiatry | Admitting: Licensed Clinical Social Worker

## 2017-07-26 DIAGNOSIS — F332 Major depressive disorder, recurrent severe without psychotic features: Secondary | ICD-10-CM | POA: Diagnosis not present
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
## 2017-07-30 ENCOUNTER — Other Ambulatory Visit (HOSPITAL_COMMUNITY): Payer: BLUE CROSS/BLUE SHIELD | Admitting: Licensed Clinical Social Worker

## 2017-07-30 ENCOUNTER — Encounter (HOSPITAL_COMMUNITY): Payer: Self-pay | Admitting: Psychiatry

## 2017-07-30 DIAGNOSIS — F900 Attention-deficit hyperactivity disorder, predominantly inattentive type: Secondary | ICD-10-CM | POA: Insufficient documentation

## 2017-07-30 DIAGNOSIS — F332 Major depressive disorder, recurrent severe without psychotic features: Secondary | ICD-10-CM

## 2017-07-30 MED ORDER — AMPHETAMINE-DEXTROAMPHETAMINE 10 MG PO TABS: 10.0000 mg | ORAL_TABLET | Freq: Two times a day (BID) | ORAL | 0 refills | Status: DC

## 2017-07-30 NOTE — Psych (Signed)
Behavioral Health Partial Program Assessment Note  Date: 07/30/2017 Name: Johnny Delgado MRN: [id number]  Chief Complaint: post inpatient being treated for depression with suicidal thinking  Subjective:feeling less depressed and not currently suicidal   HPI:Johnny Delgado  Has been depressed since about middle school with anxiety becoming apparent about the end of high school.  college was not successful due to the anxiety, depression and what in retrospect seemed to be ADHD.  He said his brother has been diagnosed with ADHD and he has suspected the same in him but was told over the years by his parents he did not have it.  He read his old report cards and poor concentration and follow through were regularly mentioned.  He does have trouble focusing, organizing, deciding where to start, starting and not finishing tasks, procrastinating, underachieving, rushing through work, and being regularly distracted.  Depression is sadness, no motivation, interest or energy, poor sleep and appetite, suicidal thoughts.  Anxiety is general worry.  In addition he has poor self esteem.   Childhood was okay.  No abuse or trauma.  Recent stressors were having to leave college for academic reasons, breaking up with a girlfriend, father suddenly leaving the family and moving to [place] and joining the [employer] and having to leave after 2 weeks due to a panic attack. He started a job recently but quit after a day or so due to demeaning treatment by his boss.Johnny Delgado is a 22 y.o. Caucasian male presents with depression and anxiety.  Johnny Delgado was enrolled in partial psychiatric program on 07/30/17.  Primary complaints include: anxiety, anxiety attacks, depression worse, difficulty sleeping, difficulty with school, fear of impending doom, fearfulness, feeling depressed, feeling suicidal, poor concentration, relationship difficulties and stressed at work.  Onset of symptoms was gradual with rapidly worsening course since that time.  Psychosocial Stressors include the following: family and school and relationship issues..   I have reviewed the inpatient record and the history as presented by the Johnny Delgado  Complaints of Pain: none Past Psychiatric History:  Just out of inpatient    Substance Abuse History: none Use of Alcohol: denied Use of Caffeine: not an issueUse of over the counter: not an issue  No past surgical history on file.  Past Medical History:  Diagnosis Date  . Anxiety   . Depression    Outpatient Encounter Medications as of 07/30/2017  Medication Sig  . amphetamine-dextroamphetamine (ADDERALL) 10 MG tablet Take 1 tablet (10 mg total) by mouth 2 (two) times daily with a meal.  . FLUoxetine (PROZAC) 40 MG capsule Take 1 capsule (40 mg total) by mouth daily. For mood control  . hydrOXYzine (ATARAX/VISTARIL) 10 MG tablet Take 1 tablet (10 mg total) by mouth 3 (three) times daily as needed for anxiety.  . traZODone (DESYREL) 50 MG tablet Take 1 tablet (50 mg total) by mouth at bedtime.   No facility-administered encounter medications on file as of 07/30/2017.    Allergies  Allergen Reactions  . Banana Diarrhea    Social History   Tobacco Use  . Smoking status: Never Smoker  . Smokeless tobacco: Never Used  Substance Use Topics  . Alcohol use: No    Frequency: Never   Functioning Relationships: good support system Education: College       Please specify degree: currently not attending college Other Pertinent History: None No family history on file.   Review of Systems Constitutional: negative Eyes: negative Ears, nose, mouth, throat, and face: negative Respiratory: negative Cardiovascular: negative Gastrointestinal: negative  Genitourinary: negative Integument/breast: negative Hematologic/lymphatic: negative Musculoskeletal: negative Neurological: negative Behavioral/Psych: depression, anxiety poor concentration Endocrine: negative Allergic/Immunologic:  negative  Objective:  There were no vitals filed for this visit.  Physical Exam: No exam performed today, no exam necessary.  Mental Status Exam: Appearance:  Well groomed Psychomotor::  Within Normal Limits Attention span and concentration: Normal Behavior: calm, cooperative and adequate rapport can be established Speech:  normal pitch and normal volume Mood:  depressed and anxious Affect:  normal and mood-congruent Thought Process:  Coherent and Goal Directed Thought Content:  Logical Orientation:  person, place and time/date Cognition:  grossly intact Insight:  Intact Judgment:  Intact Estimate of Intelligence: Above average Fund of knowledge: Intact Memory: Recent and remote intact Abnormal movements: None Gait and station: Normal  Assessment:  Diagnosis: Severe recurrent major depression without psychotic features (HCC) [F33.2] 1. Severe recurrent major depression without psychotic features (HCC)   2. ADHD (attention deficit hyperactivity disorder), inattentive type     Indications for admission: inpatient care required if not in partial hospital program  Plan: Johnny Delgado enrolled in Partial Hospitalization Program  Treatment options and alternatives reviewed with Johnny Delgado and Johnny Delgado understands the above plan.   Comments: the current meds he says seem to be working at fluoxetine 40 mg daily, hydroxyzine 10 mg tid prn and trazodone 50 mg hs.  Will try Adderall 10 mg bid starting tomorrow to see if the ADHD symptoms improve and if anxiety is not made worse. Carolanne Grumbling.    Yashvi Jasinski, MD

## 2017-07-31 ENCOUNTER — Other Ambulatory Visit (HOSPITAL_COMMUNITY): Payer: BLUE CROSS/BLUE SHIELD | Admitting: Licensed Clinical Social Worker

## 2017-07-31 ENCOUNTER — Other Ambulatory Visit (HOSPITAL_COMMUNITY): Payer: BLUE CROSS/BLUE SHIELD | Admitting: Occupational Therapy

## 2017-07-31 ENCOUNTER — Encounter (HOSPITAL_COMMUNITY): Payer: Self-pay | Admitting: Occupational Therapy

## 2017-07-31 DIAGNOSIS — R4589 Other symptoms and signs involving emotional state: Secondary | ICD-10-CM

## 2017-07-31 DIAGNOSIS — F0789 Other personality and behavioral disorders due to known physiological condition: Secondary | ICD-10-CM

## 2017-07-31 DIAGNOSIS — F332 Major depressive disorder, recurrent severe without psychotic features: Secondary | ICD-10-CM

## 2017-07-31 NOTE — Therapy (Signed)
[address] [facility] HOSPITAL[facility] PROGRAM [address] [place], [place], [address] Phone: [phone]   Fax:  [phone]  Occupational Therapy Evaluation & Treatment  Patient Details  Name: Johnny Delgado MRN: [id number] Date of Birth: 12/19/1995 Referring Provider: Dr. Carolanne GrumblingGerald Taylor   Encounter Date: 07/31/2017  OT End of Session - 07/31/17 1426    Visit Number  1    Number of Visits  6    Date for OT Re-Evaluation  08/24/17    Authorization Type  [place]; BCBS    OT Start Time  1030    OT Stop Time  1145    OT Time Calculation (min)  75 min    Activity Tolerance  Patient tolerated treatment well    Behavior During Therapy  [facility] for tasks assessed/performed       Past Medical History:  Diagnosis Date  . Anxiety   . Depression     History reviewed. No pertinent surgical history.  There were no vitals filed for this visit.  Subjective Assessment - 07/31/17 1425    Currently in Pain?  No/denies        [facility] Assessment - 07/31/17 1425      Assessment   Medical Diagnosis  MDD    Referring Provider  Dr. Carolanne GrumblingGerald Taylor    Onset Date/Surgical Date  -- chronic      Precautions   Precautions  None      Restrictions   Weight Bearing Restrictions  No      Balance Screen   Has the patient fallen in the past 6 months  No    Has the patient had a decrease in activity level because of a fear of falling?   No    Is the patient reluctant to leave their home because of a fear of falling?   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 The individual is likely in a passivity state or manifests non-goal-directed behavior. This is considered a type of motivational deficit and warrants motivational interventions.        Assessment:  Patient demonstrates impersonal/amotivational behaviors. This is considered a type of motivational deficit and warrants motivational interventions. Patient will benefit from occupational therapy intervention in order to improve time management, financial management, stress management, job readiness skills, social skills, sleep hygiene, exercise and healthy eating habits, and health management skills and other psychosocial skills needed for preparation to return to full time community living and to be a productive community member.     Plan:  Patient will participate in skilled occupational therapy sessions individually or in a group setting to improve coping skills, psychosocial skills, and emotional skills required to return to prior level of function as a productive community member. Treatment  will be 1-2 times per week for 2-6 weeks.          OT Group: Self-Esteem   S:  "They say there's truth in a joke."   O:  Patient actively participated in the following skilled occupational therapy treatment session this date:             Social and communication skills: Pt participated in discussion of self-esteem and it's role in effective communication. Pt engaged in discussion on confidence and enhanced communication both verbal and non-verbal. Pt completed self-esteem "Toot Your Margaretmary Eddy" worksheet identifying good qualities/characteristics about herself and discussed variables  that affect self-esteem. Pt then completed negative thought stopping chart identifying triggers of negative thoughts and feelings associated with those negative thoughts; pt then gave an alternative (positive) thought process and it's associated feeling. Also participated in group brainstorming of negative thought processes and how to counter these thoughts.    A:  Patient participated in skilled occupational therapy group for social and communication skills this date.  Patient was engaged and open to ideas and strategies introduced.     P:  Continue participation in skilled occupational therapy groups  1-2 times per week for 2 weeks in order to gain the necessary skills needed to return to full time community living and learn effective coping strategies to be a productive community resident.     OT Short Term Goals - 07/31/17 1427      OT SHORT TERM GOAL #1   Title  Patient will be educated on strategies to improve psychosocial skills needed to participate fully in all daily, work, and leisure activities.    Time  3    Period  Weeks    Status  New    Target Date  08/24/17      OT SHORT TERM GOAL #2   Title  Patient will be educated on a HEP and independent with implementation of HEP.    Time  3    Period  Weeks    Status  New      OT SHORT TERM GOAL #3   Title  Patient will independently apply psychosocial skills and coping mechanisms to her daily activities in order to function independently.    Time  3    Period  Weeks    Status  New               Plan - 07/31/17 1426    Occupational performance deficits (Please refer to evaluation for details):  ADL's;IADL's;Rest and Sleep;Education;Work;Leisure;Social Participation    Rehab Potential  Good    OT Frequency  2x / week    OT Duration  -- 3 weeks    OT Treatment/Interventions  Self-care/ADL training;Psychosocial skills training;Coping strategies training;Other (comment) community reintegration    Clinical Decision Making   Limited treatment options, no task modification necessary    Consulted and Agree with Plan of Care  Patient       Patient will benefit from skilled therapeutic intervention in order to improve the following deficits and impairments:  Decreased coping skills, Decreased psychosocial skills(decreased participation in ADL completion)  Visit Diagnosis: Severe recurrent major depression without psychotic features (HCC)  Difficulty coping  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 ADHD (attention deficit hyperactivity disorder), inattentive type 07/30/2017  . Severe recurrent major depression without psychotic features (HCC) 07/20/2017   Johnny Delgado, Johnny Delgado  [phone]  07/31/2017, 2:28 PM  [facility] HOSPITALIZATION PROGRAM [address] [place], [place], [address] Phone: [phone]   Fax:  [phone]  Name: Johnny Delgado MRN: [id number] Date of Birth: 1995-08-31

## 2017-08-01 ENCOUNTER — Other Ambulatory Visit (HOSPITAL_COMMUNITY): Payer: BLUE CROSS/BLUE SHIELD | Admitting: Licensed Clinical Social Worker

## 2017-08-01 ENCOUNTER — Encounter (HOSPITAL_COMMUNITY): Payer: Self-pay

## 2017-08-01 VITALS — BP 116/72 | HR 120 | Ht 70.0 in | Wt 186.0 lb

## 2017-08-01 DIAGNOSIS — F332 Major depressive disorder, recurrent severe without psychotic features: Secondary | ICD-10-CM

## 2017-08-01 NOTE — Psych (Signed)
[facility] BH PHP THERAPIST PROGRESS NOTE  HOLLIS TULLER [id number]  Session Time: 9:00 - 10:30   Participation Level: Active  Behavioral Response: CasualAlertAnxious  Type of Therapy: Group Therapy  Treatment Goals addressed: Coping  Interventions: CBT, DBT, Solution Focused, Supportive and Reframing  Summary:  Clinician led check-in regarding current stressors and situation, and review of patient completed daily inventory. Clinician utilized active listening and empathetic response and validated patient emotions. Clinician facilitated processing group on pertinent issues.   Therapist Response: Patient arrived within time allowed and reports that he is feeling "pretty good." Patient rates his mood at a 7 on a scale of 1-10 with 10 being great. Pt reports he applied for a job, continued working on his jigsaw puzzle, and played video games. Patient continues to struggle with advice giving. Patient engaged in discussion.    Session Time: 10:30 -11:30  Participation Level: Active  Behavioral Response: CasualAlertDepressed  Type of Therapy: Group Therapy, OT  Treatment Goals addressed: Coping  Interventions: Psychosocial skills training, Supportive,   Summary:  Occupational Therapy group  Therapist Response: Patient engaged in group. See OT note.       Session Time: 11:30 - 12:15  Participation Level: Active  Behavioral Response: CasualAlertDepressed  Type of Therapy: Group Therapy, Psychotherapy  Treatment Goals addressed: Coping  Interventions: CBT, Solution focused, Supportive, Reframing  Summary:  Group continued topic of goals and values. Cln reviewed SMART goals and worked with group members to workshop their completed SMART goals from the day before.    Therapist Response: Patient engaged in group. Pt identified SMART goal of going back to school in August. Pt walked through the SMART process regarding researching school programs.       Session Time:  12:15 - 1:00  Participation Level: Active  Behavioral Response: CasualAlertDepressed  Type of Therapy: Group Therapy, Activity Therapy  Treatment Goals addressed: Coping  Interventions: Psychologist, occupational, Supportive  Summary:  Reflection Group: Patients encouraged to practice skills and interpersonal techniques or work on mindfulness and relaxation techniques. The importance of self-care and making skills part of a routine to increase usage were stressed   Therapist Response: Patient engaged and participated appropriately.       Session Time: 1:00- 2:00  Participation Level: Active  Behavioral Response: CasualAlertAnxious and Depressed  Type of Therapy: Group Therapy, Psychoeducation; Psychotherapy  Treatment Goals addressed: Coping  Interventions: CBT; Solution focused; Supportive; Reframing  Summary: 12:45 - 1:50: Clinician introduced topic of Cognitive distortions. Group began discussing cognitive distortion handout and working on understanding each distortions and providing examples.  1:50 -2:00 Clinician led check-out. Clinician assessed for immediate needs, medication compliance and efficacy, and safety concerns   Therapist Response: Patient engaged activity and discussion. Pt reports understanding of how cognitive distortions affect [place] and is able to give examples.  At check-out, patient rates his mood at a 8 on a scale of 1-10 with 10 being great. Patient reports plans of applying for more jobs and working on his goals.  Patient demonstrates some progress as evidenced by actively pursuing goals discussed in group. Patient denies SI/HI/self-harm at the end of group.    Suicidal/Homicidal: Nowithout intent/plan   Plan: Pt will continue in PHP while continuing to engage in medication management and work on increased focus, decreasing depression and anxiety symptoms, and self managing symptoms.   Diagnosis: Severe recurrent major depression without psychotic  features (HCC) [F33.2]    1. Severe recurrent major depression without psychotic features (HCC)  Donia GuilesJenny Dezaray Shibuya, LCSW 08/01/2017

## 2017-08-01 NOTE — Psych (Signed)
   [facility] THERAPIST [facility]  Johnny Delgado [id number]  Session Time: 9:00 - 10:30   Participation Level: Active  Behavioral Response: CasualAlertAnxious  Type of Therapy: Group Therapy  Treatment Goals addressed: Coping  Interventions: Supportive and Reframing  Summary: Pharmacy group   Therapist Response: Patient engaged in activity and discussion.       Session Time: 10:30 - 12:00  Participation Level: Active  Behavioral Response: CasualAlertDepressed  Type of Therapy: Group Therapy, Psychotherapy  Treatment Goals addressed: Coping  Interventions: CBT, DBT, Solution Focused, Supportive and Reframing  Summary:  Clinician led check-in regarding current stressors and situation, and review of patient completed daily inventory. Clinician utilized active listening and empathetic response and validated patient emotions. Clinician facilitated processing group on pertinent issues.   Therapist Response: Johnny Delgado is a 22 y.o. male who presents with depression and anxiety symptoms. Patient arrived within time allowed and reports that he is feeling "super anxious." Pt reports his weekend went well and he was productive with household chores. Pt reports some increased anxiety as it is his first session.  Patient struggled to remain focused and states his mind is "going everywhere." Patient engaged in discussion.      Session Time: 12:00-12:45  Participation Level: Active  Behavioral Response: CasualAlertDepressed  Type of Therapy: Group Therapy, Activity Therapy  Treatment Goals addressed: Coping  Interventions: Psychologist, occupationalocial Skills Training, Supportive  Summary:  Reflection Group: Patients encouraged to practice skills and interpersonal techniques or work on mindfulness and relaxation techniques. The importance of self-care and making skills part of a routine to increase usage were stressed   Therapist Response: Patient engaged and participated  appropriately.       Session Time: 12:45- 2:00  Participation Level: Active  Behavioral Response: CasualAlertAnxious and Depressed  Type of Therapy: Group Therapy, Psychoeducation; Psychotherapy  Treatment Goals addressed: Coping  Interventions: CBT; Supportive; Reframing  Summary: 12:45 - 1:50: Clinician introduced topic of Goals and Values. Clinician led discussion on how to recognize values and patients completed values and priorities handout to determine their strongest values. Clinician introduced SMART goals and group worked on example of how to complete a SMART goal. Pt's were given homework to identify a goal based on their values and complete a SMART workup.  1:50 -2:00 Clinician led check-out. Clinician assessed for immediate needs, medication compliance and efficacy, and safety concerns   Therapist Response: Patient engaged activity and discussion. Patient identified values of belonging and being independent. Pt reports he is somewhat currently inline with those values.  At check-out, patient rates his mood at a 6 on a scale of 1-10 with 10 being great at the end of group. Patient reports plans of beginning a new jigsaw puzzle. Patient demonstrates some progress as evidenced by appropriately engaging in first group session. Patient denies SI/HI/self-harm at the end of group.      Suicidal/Homicidal: Nowithout intent/plan   Plan: Pt will continue in PHP while continuing to engage in medication management and work on increased focus, decreasing depression and anxiety symptoms, and self managing symptoms.   Diagnosis: Severe recurrent major depression without psychotic features (HCC) [F33.2]    1. Severe recurrent major depression without psychotic features (HCC)   2. ADHD (attention deficit hyperactivity disorder), inattentive type       Donia GuilesJenny Hanalei Glace, LCSW 08/01/2017

## 2017-08-01 NOTE — Progress Notes (Signed)
Patient presents with appropriate affect, depressed mood but stated he felt his recent medication changes have really started to help him feel somewhat better and less depressed.  Patient rated his current depression a 1, anxiety a 2-3, and hopelessness a 0 on a scale of 0-10 with 0 being none and 10 Johnny worst he could manage as reports he does feel more hopeful with medication changes and by starting PHP.  Patient reported no current side effects to medications and sleeping improved with recent start of Trazodone.  Reviewed all medications with patient and potential side effects to be aware could occur.  Patient reported he, for Johnny first time in a long time, is more focused and motivated to return to school and overall with his thoughts since starting Adderall 10 mg twice a day as well and reports he is hopefull he will be reaccepted to [employer], as was dismissed in May 2018 for poor motivation and GPA decline.  Patient reported some past history of depression and anxiety and over Johnny past year was released from basic training from [employer] after having a "panic attack" while there.  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  Stated he then had thought of walking out in front of a car on [address] on Valentine's Day, that he told his brother who then helped him self [facility].  Patient again reported no suicidal thoughts or ideations at this time and is happy with all recent medication adjustments.  Patient reported plan to also apply to [employer] to restart college in Johnny Fall if not accepted back to [employer] and wants to complete PHP to help him improve management of anxiety and mood.  Patient stable today and returned to group without  any other concerns reported and will notify this nurse if any worsening of symptoms or issues while in Johnny program.

## 2017-08-01 NOTE — Psych (Signed)
   [facility] PHP THERAPIST PROGRESS NOTE  Johnny Delgado [id number]  Session Time: 9:00 - 10:45   Participation Level: Active  Behavioral Response: CasualAlertAnxious  Type of Therapy: Group Therapy  Treatment Goals addressed: Coping  Interventions: CBT, DBT, Solution Focused, Supportive and Reframing  Summary:  Clinician led check-in regarding current stressors and situation, and review of patient completed daily inventory. Clinician utilized active listening and empathetic response and validated patient emotions. Clinician facilitated processing group on pertinent issues.   Therapist Response: Patient arrived within time allowed and reports that he is in a "good mood". Patient rates his mood at an "8" on a scale of 1-10 with 10 being great. Patient reports that he had good night and was able to get his haircut. Patient explained that he was able to start making small goals for himself and has plans of continuing to research local schools for [employer] programs. Patient stated that he is already looking into [employer] and [employer] and outlining the pros of cons of each school. Patient reports he is struggling managing his money. Patient shared that he is going to download free budgeting apps on his phone and is going to investigate opening up an account at his bank that can be locked. Patient engaged in activity and discussion.     Session Time: 10:45 -12:15  Participation Level: Active  Behavioral Response: CasualAlertAnxious  Type of Therapy: Group Therapy,spiritual care  Treatment Goals addressed: Coping  Interventions: strengths based, reframing, Supportive,   Summary:  Spiritual care group  Therapist Response: Patient engaged in group. See chaplain note.         Session Time: 12:15 - 1:00  Participation Level: Active  Behavioral Response: CasualAlertAnxious  Type of Therapy: Group Therapy, Activity Therapy  Treatment Goals addressed: Coping  Interventions:  Psychologist, occupationalocial Skills Training, Supportive  Summary:  Reflection Group: Patients encouraged to practice skills and interpersonal techniques or work on mindfulness and relaxation techniques. The importance of self-care and making skills part of a routine to increase usage were stressed   Therapist Response: Patient engaged and participated appropriately.       Session Time: 1:00- 2:00  Participation Level: Active  Behavioral Response: CasualAlertEuthymic  Type of Therapy: Group Therapy, Activity therapy  Treatment Goals addressed: Coping  Interventions: relaxation training, Supportive; Reframing  Summary: 1:00 - 1:50: Relaxation group: Cln [name] led yoga group focused on retraining the body's response to stress. 1:50 -2:00 Clinician led check-out. Clinician assessed for immediate needs, medication compliance and efficacy, and safety concerns   Therapist Response: Patient engaged activity and discussion.   At check-out, patient rates his mood at a 9 on a scale of 1-10 with 10 being great. Patient reports that he is going to go to the bank, do more research on schools, and call about a Designer, fashion/clothingstudent loan after group. Patient demonstrates some progress by continuing to identify actions steps he is going to take to reach goals he has set for himself. Patient denies SI/HI/self-harm thoughts at the end of group.      Suicidal/Homicidal: Nowithout intent/plan   Plan: Pt will continue in PHP while continuing to engage in medication management and work on increased focus, decreasing depression and anxiety symptoms, and self managing symptoms.   Diagnosis: Severe recurrent major depression without psychotic features (HCC) [F33.2]    1. Severe recurrent major depression without psychotic features [facility](HCC)       Johnny GuilesJenny Austynn Pridmore, LCSW 08/01/2017

## 2017-08-02 ENCOUNTER — Other Ambulatory Visit (HOSPITAL_COMMUNITY): Payer: BLUE CROSS/BLUE SHIELD

## 2017-08-02 ENCOUNTER — Other Ambulatory Visit (HOSPITAL_COMMUNITY): Payer: BLUE CROSS/BLUE SHIELD | Admitting: Licensed Clinical Social Worker

## 2017-08-02 DIAGNOSIS — F332 Major depressive disorder, recurrent severe without psychotic features: Secondary | ICD-10-CM

## 2017-08-02 NOTE — Progress Notes (Signed)
Progress note  Mr Johnny Delgado says the response to Adderall has been dramatic and very helpful.  Realizes that he has been working with one hand behind his back.  He is feeling less depressed and more optimistic.  He is already looking at re starting college and is excited about his future.

## 2017-08-03 ENCOUNTER — Other Ambulatory Visit (HOSPITAL_COMMUNITY): Payer: BLUE CROSS/BLUE SHIELD | Attending: Psychiatry | Admitting: Licensed Clinical Social Worker

## 2017-08-03 DIAGNOSIS — F332 Major depressive disorder, recurrent severe without psychotic features: Secondary | ICD-10-CM

## 2017-08-03 DIAGNOSIS — F419 Anxiety disorder, unspecified: Secondary | ICD-10-CM | POA: Insufficient documentation

## 2017-08-04 NOTE — Psych (Signed)
[facility]  Johnny Delgado [id number]  Session Time: 9:00 - 10:15   Participation Level: Active  Behavioral Response: CasualAlertAnxious  Type of Therapy: Group Therapy  Treatment Goals addressed: Coping  Interventions: CBT, DBT, Solution Focused, Supportive and Reframing  Summary:  Clinician led check-in regarding current stressors and situation, and review of patient completed daily inventory. Clinician utilized active listening and empathetic response and validated patient emotions. Clinician facilitated processing group on pertinent issues.   Therapist Response: Patient arrived within time allowed and reports that he is "feeling much better today." Patient rates his mood at a 7 on a scale of 1-10 with 10 being great. Patient reports that he had a nice evening with her family and was able to look at apartments to rent with his brother and go to the [employer] store and look at getting a new phone. Patient reports that he is still working on relying less on his mother for certain supports. Patient engaged in discussion.        Session Time: 10:15 -11:00  Participation Level: Active  Behavioral Response: CasualAlertDepressed  Type of Therapy: Group Therapy, psychoeducation, psychotherapy  Treatment Goals addressed: Coping  Interventions: CBT, DBT, Solution Focused, Supportive and Reframing  Summary:  Clinician continued discussion on boundaries. Clinician reviewed the different types of boundaries and had patients identified examples of healthy and unhealthy boundaries within each boundary type.   Therapist Response: Patient participated and reports understanding of the different boundary types.         Session Time: 11:00 - 12:00  Participation Level: Active  Behavioral Response: CasualAlertDepressed  Type of Therapy: Group Therapy, Psychotherapy  Treatment Goals addressed: Coping  Interventions: CBT, Solution focused, Supportive,  Reframing  Summary:  Clinician led patients through a boundary exploration worksheet. Group members then created a  specific action plan to work on an unhealthy boundary in their life.    Therapist Response: Patient reported that he needs to work on physical and material boundaries with his friend and family. Patient plans on working on being more open with his friends and family on what he needs from then and knowing what they need from him to improve their relationships.         Session Time: 12:00- 1:00  Participation Level: Active  Behavioral Response: CasualAlertDepressed  Type of Therapy: Group Therapy, Psychoeducation; Psychotherapy  Treatment Goals addressed: Coping  Interventions: CBT; Solution focused; Supportive; Reframing  Summary: 12:00 - 12:50 Group watched "The Agony of Unsubscribing" TedTalk and discussed the topic of  Perspective and how it can change our view of reality. 12:50 -1:00 Clinician led check-out. Clinician assessed for immediate needs, medication compliance and efficacy, and safety concerns   Therapist Response: Patient engaged in discussion. Patient reported that he is going to start working control issues and being more fluid when it comes to specific situations.  At check-out, patient rates her mood at a 8 on a scale of 1-10 with 10 being great. Patient reports that he is going to run errands after group and get a new phone. Patient also has plans of hanging out with a friend over the weekend that he is excited about. Patient demonstrates some progress as evidenced by stating that he appreciates the discussion on boundaries because he knows that is something he needs to improve on. Patient denies SI/HI/self-harm at the end of group.          Suicidal/Homicidal: Nowithout intent/plan   Plan: Pt will continue in PHP  while continuing to engage in medication management and work on increased focus, decreasing depression and anxiety symptoms, and  self managing symptoms.   Diagnosis: Severe recurrent major depression without psychotic features (HCC) [F33.2]    1. Severe recurrent major depression without psychotic features [facility])       Donia Guiles, LCSW 08/04/2017

## 2017-08-04 NOTE — Psych (Signed)
[facility] BH PHP THERAPIST PROGRESS NOTE  Johnny Delgado [id number]  Session Time: 9:00 - 10:30   Participation Level: Active  Behavioral Response: CasualAlertAnxious  Type of Therapy: Group Therapy  Treatment Goals addressed: Coping  Interventions: CBT, DBT, Solution Focused, Supportive and Reframing  Summary:  Clinician led check-in regarding current stressors and situation, and review of patient completed daily inventory. Clinician utilized active listening and empathetic response and validated patient emotions. Clinician facilitated processing group on pertinent issues.   Therapist Response: Patient arrived within time allowed and reports that "I got one hour of sleep so I am tired." Patient rates his mood at a 4 on a scale of 1-10 with 10 being great. Patient reports that he forgot to take his sleep medication last night and therefore was unable to sleep. Patient stated that he feels like he is becoming too dependent on his sleep medication and does not want that to happen. Patient continued his research on different colleges and is looking into switching phone providers. Patient reports that he is still working how to sleep without taking his sleep medication at night.      Session Time: 10:30 -12:00  Participation Level: Active  Behavioral Response: CasualAlertDepressed  Type of Therapy: Group Therapy, psychotherapy, psychoeducation  Treatment Goals addressed: Coping  Interventions: CBT, DBT, Solution Focused, Supportive and Reframing   Summary:  Clinician continued discussion on cognitive distortions and had patient point out at least two that they are struggling with.    Therapist Response: Patient engaged in activity and discussion. Patient identified that he does a lot of magical thinking and disqualifies the positive a lot of times throughout the day. Patient shared that he is going to work on focusing on the positives of a situation versus the negatives.        Session Time: 12:00 - 12:45  Participation Level: Active  Behavioral Response: CasualAlertDepressed  Type of Therapy: Group Therapy, Activity Therapy  Treatment Goals addressed: Coping  Interventions: Psychologist, occupational, Supportive  Summary:  Reflection Group: Patients encouraged to practice skills and interpersonal techniques or work on mindfulness and relaxation techniques. The importance of self-care and making skills part of a routine to increase usage were stressed   Therapist Response: Patient engaged and participated appropriately.       Session Time: 12:45- 2:00  Participation Level: Active  Behavioral Response: CasualAlertDepressed  Type of Therapy: Group Therapy, Psychoeducation; Psychotherapy  Treatment Goals addressed: Coping  Interventions: CBT; Solution focused; Supportive; Reframing  Summary: 12:45 - 1:50: Clinician led a discussion on the characteristics of boundaries and had patients identify examples of rigid, porous, and healthy boundaries in their life.  1:50 - 2:00 Check-out: Clinician assessed for immediate needs, medication compliance and efficacy, and safety concerns   Therapist Response: Patient engaged in activity and discussion. Patient reported that he is working on developing healthy boundaries with his friends. Patient explained that he feels as though he has set healthy boundaries with his mother and brother by communicating what he expects to see within their relationships At check-out, patient rates his mood at a 7 on a scale of 1-10 with 10 being great. Patient reported that he is going to [employer] after group to see about a new phone plan, work on a puzzle, and do more research on [place]. Patient demonstrates some progress as evidenced taking into consideration the sleep hygiene tips other group members gave him. Patient denies SI/HI/self-harm at the end of group.  Suicidal/Homicidal: Nowithout  intent/plan   Plan: Pt will continue in PHP while continuing to engage in medication management and work on increased focus, decreasing depression and anxiety symptoms, and self managing symptoms.   Diagnosis: Severe recurrent major depression without psychotic features (HCC) [F33.2]    1. Severe recurrent major depression without psychotic features [facility](HCC)       Donia GuilesJenny Alysiah Suppa, LCSW 08/04/2017

## 2017-08-06 ENCOUNTER — Other Ambulatory Visit (HOSPITAL_COMMUNITY): Payer: Self-pay

## 2017-08-06 ENCOUNTER — Other Ambulatory Visit (HOSPITAL_COMMUNITY): Payer: BLUE CROSS/BLUE SHIELD | Admitting: Licensed Clinical Social Worker

## 2017-08-06 DIAGNOSIS — F332 Major depressive disorder, recurrent severe without psychotic features: Secondary | ICD-10-CM | POA: Diagnosis not present

## 2017-08-06 DIAGNOSIS — F419 Anxiety disorder, unspecified: Secondary | ICD-10-CM | POA: Diagnosis not present

## 2017-08-06 MED ORDER — HYDROXYZINE HCL 10 MG PO TABS: 10.0000 mg | ORAL_TABLET | Freq: Three times a day (TID) | ORAL | 0 refills | Status: DC | PRN

## 2017-08-06 NOTE — Progress Notes (Signed)
Patient was given Hydroxyzine when he discharged. 10 mg tid is how the rx is written, however they only wrote it for 30 tablets. I sent it is for 90 tabs, 1 month worth of medication

## 2017-08-07 ENCOUNTER — Ambulatory Visit (HOSPITAL_COMMUNITY): Payer: No Typology Code available for payment source

## 2017-08-07 ENCOUNTER — Other Ambulatory Visit (HOSPITAL_COMMUNITY): Payer: BLUE CROSS/BLUE SHIELD | Admitting: Occupational Therapy

## 2017-08-07 ENCOUNTER — Other Ambulatory Visit (HOSPITAL_COMMUNITY): Payer: No Typology Code available for payment source

## 2017-08-07 ENCOUNTER — Encounter (HOSPITAL_COMMUNITY): Payer: Self-pay | Admitting: Occupational Therapy

## 2017-08-07 ENCOUNTER — Other Ambulatory Visit (HOSPITAL_COMMUNITY): Payer: BLUE CROSS/BLUE SHIELD | Admitting: Licensed Clinical Social Worker

## 2017-08-07 DIAGNOSIS — R4589 Other symptoms and signs involving emotional state: Secondary | ICD-10-CM

## 2017-08-07 DIAGNOSIS — F332 Major depressive disorder, recurrent severe without psychotic features: Secondary | ICD-10-CM

## 2017-08-07 DIAGNOSIS — F0789 Other personality and behavioral disorders due to known physiological condition: Secondary | ICD-10-CM

## 2017-08-07 DIAGNOSIS — F419 Anxiety disorder, unspecified: Secondary | ICD-10-CM | POA: Diagnosis not present

## 2017-08-07 NOTE — Therapy (Signed)
[facility] [address] [place], [place], [address] Phone: [phone]   Fax:  [phone]  Occupational Therapy Treatment  Patient Details  Name: Johnny Delgado MRN: [id number] Date of Birth: 07/03/1995 Referring Provider: Dr. Carolanne GrumblingGerald Taylor   Encounter Date: 08/07/2017  OT End of Session - 08/07/17 1722    Visit Number  2    Number of Visits  6    Date for OT Re-Evaluation  08/24/17    Authorization Type  [place]; BCBS    OT Start Time  1030    OT Stop Time  1145    OT Time Calculation (min)  75 min    Activity Tolerance  Patient tolerated treatment well    Behavior During Therapy  [facility] for tasks assessed/performed       Past Medical History:  Diagnosis Date  . Anxiety   . Depression     Past Surgical History:  Procedure Laterality Date  . WISDOM TOOTH EXTRACTION Bilateral 2015   All 4 extracted    There were no vitals filed for this visit.  Subjective Assessment - 08/07/17 1722    Currently in Pain?  No/denies         [facility] - 08/07/17 1722      Assessment   Medical Diagnosis  MDD      Precautions   Precautions  None            OT Group: Communication Skills-Assertiveness   S:  "I have a very straight face unless something is really funny. I don't show much emotion."   O:  Patient actively participated in the following skilled occupational therapy treatment session this date:             Social and communication skills: Pt participated in group geared towards assertiveness practice and appropriate communication skills/behavior. Pt engaged in discussion on body language and what is portrayed via various gestures and postures. Pt participated in group discussion of assertiveness and traits of passive, assertive, and aggressive individuals; then participated in group activity of role playing each of the 3 aforementioned traits while trying to sell a new cell phone to an  assertive/confident person. Group activity focusing on assertiveness and positive communication. Group was divided into pairs/groups and each pair completed the Positive Communication Worksheet, coming up with a real-life scenario and working through the 7 elements of positive communication. Group then came back together for discussion of each scenario and alternative ways to handle difficult situations.    A:  Patient participated in skilled occupational therapy group for social and communication skills this date.  Patient was engaged during activities and provided insightful thoughts and feelings on session.      P:  Continue participation in skilled occupational therapy groups  1-2 times per week for 2 weeks in order to gain the necessary skills needed to return to full time community living and learn effective coping strategies to be a productive community resident. Next session: Stress management.               OT Short Term Goals - 08/07/17 1723      OT SHORT TERM GOAL #1   Title  Patient will be educated on strategies to improve psychosocial skills needed to participate fully in all daily, work, and leisure activities.    Time  3    Period  Weeks    Status  On-going      OT SHORT TERM  GOAL #2   Title  Patient will be educated on a HEP and independent with implementation of HEP.    Time  3    Period  Weeks    Status  On-going      OT SHORT TERM GOAL #3   Title  Patient will independently apply psychosocial skills and coping mechanisms to her daily activities in order to function independently.    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 ADHD (attention deficit hyperactivity disorder), inattentive type 07/30/2017  . Severe recurrent major depression without psychotic features (HCC) 07/20/2017   Ezra Sites, OTR/L  [phone] 08/07/2017, 5:23 PM  [facility] BE[facility]AVIORAL [facility] PARTIAL HOSPITALIZATION PROGRAM [address] [place], [place], [address] Phone: [phone]   Fax:  [phone]  Name: Johnny Delgado MRN: [id number] Date of Birth: [birth year]

## 2017-08-07 NOTE — Progress Notes (Signed)
BH MD/PA/NP Partial Hospitalization  Progress Note  08/08/2017 2:34 PM Johnny Delgado  MRN:  [id number]  Chief Complaint:  Johnny Delgado is awake, alert and oriented. Seen standing in the hall after a late start to the day. Reports some anxiety with "rushing to get to group session". Reports feeling better overall and states he is getting a lot from group sessions. Patient is currently denying depression or depressive symptoms. denies suicidal or homicidal ideation. Denies auditory or visual hallucination and does not appear to be responding to internal stimuli. Reports the recent addition with Adderall has been helpful for his concentration and focus with school and day to day task. Patient reports he is medication compliant without mediation side effects. Report learning new coping and is enjoying the group setting. Support, encouragement and reassurance was provided.    HPI: Noted per admission note: Johnny Delgado  Has been depressed since about middle school with anxiety becoming apparent about the end of high school.  college was not successful due to the anxiety, depression and what in retrospect seemed to be ADHD.  He said his brother has been diagnosed with ADHD and he has suspected the same in him but was told over the years by his parents he did not have it.  He read his old report cards and poor concentration and follow through were regularly mentioned.  He does have trouble focusing, organizing, deciding where to start, starting and not finishing tasks, procrastinating, underachieving, rushing through work, and being regularly distracted.  Depression is sadness, no motivation, interest or energy, poor sleep and appetite, suicidal thoughts.     Visit Diagnosis:    ICD-10-CM   1. Severe recurrent major depression without psychotic features (HCC) F33.2     Past Psychiatric History:   Past Medical History:  Past Medical History:  Diagnosis Date  . Anxiety   . Depression     Past  Surgical History:  Procedure Laterality Date  . WISDOM TOOTH EXTRACTION Bilateral 2015   All 4 extracted    Family Psychiatric History:   Family History:  Family History  Problem Relation Age of Onset  . Depression Mother   . Drug abuse Mother   . Depression Father   . Drug abuse Father   . ADD / ADHD Brother     Social History:  Social History   Socioeconomic History  . Marital status: Single    Spouse name: Not on file  . Number of children: Not on file  . Years of education: Not on file  . Highest education level: Not on file  Social Needs  . Financial resource strain: Not very hard  . Food insecurity - worry: Never true  . Food insecurity - inability: Never true  . Transportation needs - medical: No  . Transportation needs - non-medical: No  Occupational History  . Not on file  Tobacco Use  . Smoking status: Never Smoker  . Smokeless tobacco: Never Used  Substance and Sexual Activity  . Alcohol use: No    Frequency: Never  . Drug use: No  . Sexual activity: No  Other Topics Concern  . Not on file  Social History Narrative  . Not on file    Allergies:  Allergies  Allergen Reactions  . Banana Diarrhea    Metabolic Disorder Labs: Lab Results  Component Value Date   HGBA1C 5.7 (H) 07/21/2017   MPG 116.89 07/21/2017   No results found for: PROLACTIN Lab Results  Component Value Date  CHOL 172 07/21/2017   TRIG 132 07/21/2017   HDL 32 (L) 07/21/2017   CHOLHDL 5.4 07/21/2017   VLDL 26 07/21/2017   LDLCALC 114 (H) 07/21/2017   Lab Results  Component Value Date   TSH 1.162 07/21/2017    Therapeutic Level Labs: No results found for: LITHIUM No results found for: VALPROATE No components found for:  CBMZ  Current Medications: Current Outpatient Medications  Medication Sig Dispense Refill  . amphetamine-dextroamphetamine (ADDERALL) 10 MG tablet Take 1 tablet (10 mg total) by mouth 2 (two) times daily with a meal. 60 tablet 0  . FLUoxetine  (PROZAC) 40 MG capsule Take 1 capsule (40 mg total) by mouth daily. For mood control 30 capsule 0  . hydrOXYzine (ATARAX/VISTARIL) 10 MG tablet Take 1 tablet (10 mg total) by mouth 3 (three) times daily as needed for anxiety. 90 tablet 0  . traZODone (DESYREL) 50 MG tablet Take 1 tablet (50 mg total) by mouth at bedtime. 30 tablet 0   No current facility-administered medications for this visit.      Musculoskeletal: Strength & Muscle Tone: within normal limits Gait & Station: normal Patient leans: N/A  Psychiatric Specialty Exam: ROS  There were no vitals taken for this visit.There is no height or weight on file to calculate BMI.  General Appearance: Casual  Eye Contact:  Good  Speech:  Clear and Coherent  Volume:  Normal  Mood:  Anxious  Affect:  Congruent  Thought Process:  Coherent  Orientation:  Full (Time, Place, and Person)  Thought Content: WDL, Logical and Hallucinations: None   Suicidal Thoughts:  No  Homicidal Thoughts:  No  Memory:  Immediate;   Fair Recent;   Fair Remote;   Fair  Judgement:  Good  Insight:  Present  Psychomotor Activity:  Normal  Concentration:  Concentration: Fair  Recall:  Fair  Fund of Knowledge: Fair  Language: Good  Akathisia:  No  Handed:  Right  AIMS (if indicated):   Assets:  Communication Skills Desire for Improvement Resilience Social Support  ADL's:  Intact  Cognition: WNL  Sleep:  Good   Screenings: AUDIT     Admission (Discharged) from [facility]  Alcohol Use Disorder Identification Test Final Score (AUDIT)  0    GAD-7     Counselor from 07/30/2017 in [facility]PITALIZATION PROGRAM  Total GAD-7 Score  18    PHQ2-9     Counselor from 07/30/2017 in [facility] H[facility]PITALIZATION PROGRAM  PHQ-2 Total Score  5  PHQ-9 Total Score  21       Assessment and Plan:   Continue Partial Hospitalization Program  MDD:   Continue  Fluoxetine 40 mg PO QD  Anxiety:  Continue hydroxyzine 10 mg PO TID   ADHD:    Continue Adderall 10 mg PO PRN    Treatment plan reviewed and agreed upon by N.P  T.Lyanna Blystone and Patient [name] Emmanuel need for group session.     Oneta Rackanika N Rasheedah Reis, NP 08/08/2017, 2:34 PM

## 2017-08-07 NOTE — Psych (Signed)
[facility] THER[facility]  Johnny Delgado [id number]  Session Time: 9:00 - 10:30   Participation Level: Active  Behavioral Response: CasualAlertAnxious  Type of Therapy: Group Therapy  Treatment Goals addressed: Coping  Interventions: CBT, DBT, Solution Focused, Supportive and Reframing  Summary:  Clinician led check-in regarding current stressors and situation, and review of patient completed daily inventory. Clinician utilized active listening and empathetic response and validated patient emotions. Clinician facilitated processing group on pertinent issues.   Therapist Response: Patient arrived within time allowed and reports that he is having a "good day." Patient rates his mood at a 7 on a scale of 1-10 with 10 being great. Patient reports that he had a "good weekend overall." Pt shares he spent time with a friend, went to church, saw a movie, played video games, and worked on his puzzle. Pt shares having a "dip" in mood on Saturday evening and managing it by taking a drive and listening to music. Patient reports that he is still working on finding a job and keeping himself motivated. Patient engaged in discussion.        Session Time: 10:30 - 12:00  Participation Level: Active  Behavioral Response: CasualAlertDepressed  Type of Therapy: Group Therapy, Psychotherapy  Treatment Goals addressed: Coping  Interventions: CBT, Solution focused, Supportive, Reframing  Summary:  Cln introduced distress tolerance skills, reviewing their purpose and how to practice them. Cln introduced STOP and TIP skills. Pt's discussed how they could utilize these skills.      Therapist Response: Patient engaged in discussion. Pt reports understanding of skills discussed and shares most likely to use paced breathing.        Session Time: 12:00 - 12:45  Participation Level: Active  Behavioral Response: CasualAlertDepressed  Type of Therapy: Group Therapy, Activity  Therapy  Treatment Goals addressed: Coping  Interventions: Psychologist, occupationalocial Skills Training, Supportive  Summary:  Reflection Group: Patients encouraged to practice skills and interpersonal techniques or work on mindfulness and relaxation techniques. The importance of self-care and making skills part of a routine to increase usage were stressed   Therapist Response: Patient engaged and participated.       Session Time: 12:45- 2:00  Participation Level: Active  Behavioral Response: CasualAlertAnxious and Depressed  Type of Therapy: Group Therapy, Psychoeducation; Psychotherapy  Treatment Goals addressed: Coping  Interventions: CBT; Solution focused; Supportive; Reframing  Summary: 12:45 - 1:50: Clinician continued topic of distress tolerance skills and introduced ACCEPTS skill. Group did "Top 5 List" activity to plan ahead for "E," different emotion.  1:50 -2:00 Clinician led check-out. Clinician assessed for immediate needs, medication compliance and efficacy, and safety concerns   Therapist Response: Patient engaged activity and discussion. Pt reports listening to music as a way to utilize "A," activities, and completed 3 top 5 lists.  At check-out, patient rates his mood at a 9 on a scale of 1-10 with 10 being great. Patient reports he does not have plans this afternoon and that that is not problematic.  Patient demonstrates some progress as evidenced by stability in mood and increase in goal directed activity. Patient denies SI/HI/self-harm at the end of group.       Suicidal/Homicidal: Nowithout intent/plan   Plan: Pt will continue in PHP while continuing to engage in medication management and work on increased focus, decreasing depression and anxiety symptoms, and self managing symptoms.   Diagnosis: Severe recurrent major depression without psychotic features (HCC) [F33.2]    1. Severe recurrent major depression without psychotic features (  HCC)       Donia Guiles,  LCSW 08/07/2017

## 2017-08-08 ENCOUNTER — Other Ambulatory Visit (HOSPITAL_COMMUNITY): Payer: No Typology Code available for payment source

## 2017-08-08 ENCOUNTER — Other Ambulatory Visit (HOSPITAL_COMMUNITY): Payer: BLUE CROSS/BLUE SHIELD | Admitting: Licensed Clinical Social Worker

## 2017-08-08 DIAGNOSIS — F332 Major depressive disorder, recurrent severe without psychotic features: Secondary | ICD-10-CM

## 2017-08-08 DIAGNOSIS — F419 Anxiety disorder, unspecified: Secondary | ICD-10-CM | POA: Diagnosis not present

## 2017-08-08 DIAGNOSIS — F900 Attention-deficit hyperactivity disorder, predominantly inattentive type: Secondary | ICD-10-CM

## 2017-08-09 ENCOUNTER — Other Ambulatory Visit (HOSPITAL_COMMUNITY): Payer: No Typology Code available for payment source

## 2017-08-09 ENCOUNTER — Ambulatory Visit (HOSPITAL_COMMUNITY): Payer: No Typology Code available for payment source

## 2017-08-09 ENCOUNTER — Encounter (HOSPITAL_COMMUNITY): Payer: Self-pay

## 2017-08-09 ENCOUNTER — Other Ambulatory Visit (HOSPITAL_COMMUNITY): Payer: BLUE CROSS/BLUE SHIELD | Admitting: Specialist

## 2017-08-09 ENCOUNTER — Other Ambulatory Visit (HOSPITAL_COMMUNITY): Payer: BLUE CROSS/BLUE SHIELD | Admitting: Licensed Clinical Social Worker

## 2017-08-09 VITALS — BP 106/78 | HR 100 | Ht 70.0 in | Wt 180.0 lb

## 2017-08-09 DIAGNOSIS — F332 Major depressive disorder, recurrent severe without psychotic features: Secondary | ICD-10-CM

## 2017-08-09 DIAGNOSIS — F419 Anxiety disorder, unspecified: Secondary | ICD-10-CM | POA: Diagnosis not present

## 2017-08-09 DIAGNOSIS — R4589 Other symptoms and signs involving emotional state: Secondary | ICD-10-CM

## 2017-08-09 DIAGNOSIS — F900 Attention-deficit hyperactivity disorder, predominantly inattentive type: Secondary | ICD-10-CM

## 2017-08-09 DIAGNOSIS — F0789 Other personality and behavioral disorders due to known physiological condition: Secondary | ICD-10-CM

## 2017-08-09 NOTE — Progress Notes (Signed)
Patient presents with appropriate affect, more level mood and reported thinking he was ready for transition out of PHP set for 08/10/17.  Patient denied any suicidal or homicidal ideations, no auditory or visual hallucinations and reported "I'm feeling amazing".  Patient rated his current level of depression a 4, anxiety a 2 and hopelessness a 1 on a scale of 0-10 with 0 being none and 10 the worst he could manage.  Patient stated he is a little anxious and worried he may not get back into college as a returning student but is preparing for news either way.  States he has an interview set up for a job with [employer] for this coming Monday and is already set up for a follow-up new intake appointment with a psychiatrist at this office on 08/18/17.  Patient denies any problems with current medication regimen and reported sleeping better with only some noted decreased appetite due to Adderall and dry mouth at times due to taking Hydroxyzine 3 times a day.  Patient reported liking the medications he is taking and feels they are really helping.  Denies any other concerns about finishing PHP and will contact this nurse or PHP staff if any problems [facility] until he follows up with outpatient assigned provider.

## 2017-08-09 NOTE — Psych (Signed)
[facility]  Rickey Barbaraolan W Parrillo [id number]  Session Time: 9:00 - 10:30   Participation Level: Active  Behavioral Response: CasualAlertAnxious  Type of Therapy: Group Therapy  Treatment Goals addressed: Coping  Interventions: CBT, DBT, Solution Focused, Supportive and Reframing  Summary:  Clinician led check-in regarding current stressors and situation, and review of patient completed daily inventory. Clinician utilized active listening and empathetic response and validated patient emotions. Clinician facilitated processing group on pertinent issues.   Therapist Response: Patient reports that he is having a "good day." Patient rates his mood at a 8 on a scale of 1-10 with 10 being great. Patient reports that he felt "pretty good." Pt shares he has been having a difficult time waking up recently however is sleeping well.  Patient reports that he continues to look for a job and complete school applications. Patient engaged in discussion.        Session Time: 10:30 -11:30   Participation Level: Active   Behavioral Response: CasualAlertDepressed   Type of Therapy: Group Therapy, OT   Treatment Goals addressed: Coping   Interventions: Psychosocial skills training, Supportive,    Summary:  Occupational Therapy group   Therapist Response: Patient engaged in group. See OT note.           Session Time: 11:30 - 12:15   Participation Level: Active   Behavioral Response: CasualAlertDepressed   Type of Therapy: Group Therapy, Psychotherapy   Treatment Goals addressed: Coping   Interventions: CBT, Solution focused, Supportive, Reframing   Summary:  Group continued topic of distress tolerance. Cln reviewed skills discussed yesterday and group members discussed ways they put them into practice last night.    Therapist Response: Patient engaged in group. Pt states he attempted to use distraction in the form of different emotion and watching a  psychological thriller.        Session Time: 12:15 - 1:00  Participation Level: Active  Behavioral Response: CasualAlertDepressed  Type of Therapy: Group Therapy, Activity Therapy  Treatment Goals addressed: Coping  Interventions: Psychologist, occupationalocial Skills Training, Supportive  Summary:  Reflection Group: Patients encouraged to practice skills and interpersonal techniques or work on mindfulness and relaxation techniques. The importance of self-care and making skills part of a routine to increase usage were stressed   Therapist Response: Patient engaged and participated.       Session Time: 12:45- 2:00  Participation Level: Active  Behavioral Response: CasualAlertAnxious and Depressed  Type of Therapy: Group Therapy, Psychoeducation; Psychotherapy  Treatment Goals addressed: Coping  Interventions: CBT; Solution focused; Supportive; Reframing  Summary: 12:45 - 1:50: Clinician continued topic of distress tolerance skills and introduced Self Soothe skill. Group members discussed ways they would utilize self soothe in their everyday life.   1:50 -2:00 Clinician led check-out. Clinician assessed for immediate needs, medication compliance and efficacy, and safety concerns   Therapist Response: Patient engaged activity and discussion. Pt reports ASMR, looking at space, and taking a drive as ways he can practice self soothe.  At check-out, patient rates his mood at a 8 on a scale of 1-10 with 10 being great. Patient reports plans of working on [employer] cop[employer]chool applications and art.  Patient demonstrates neutral progress as evidenced by an increase in goal directed activity however stagnating within that progress. Patient denies SI/HI/self-harm at the end of group.       Suicidal/Homicidal: Nowithout intent/plan   Plan: Pt will continue in PHP while continuing to engage in medication management and work on increased focus,  decreasing depression and anxiety symptoms, and self managing symptoms.    Diagnosis: Severe recurrent major depression without psychotic features (HCC) [F33.2]    1. Severe recurrent major depression without psychotic features [facility])       Donia Guiles, LCSW 08/09/2017

## 2017-08-09 NOTE — Therapy (Signed)
[facility] PART[facility] H[facility]PITAL[facility] PROGRAM [address] [place], [place], [address] Phone: [phone]   Fax:  [phone]  Occupational Therapy Treatment  Patient Details  Name: Rickey Barbaraolan W Gehling MRN: [id number] Date of Birth: 03/27/1996 Referring Provider: Dr. Carolanne GrumblingGerald Taylor   Encounter Date: 08/09/2017  OT End of Session - 08/09/17 2226    Visit Number  3    Number of Visits  6    Date for OT Re-Evaluation  08/24/17    Authorization Type  [employer]; [employer]    OT Start Time  1030    OT Stop Time  1130    OT Time Calculation (min)  60 min    Activity Tolerance  Patient tolerated treatment well    Behavior During Therapy  [facility] for tasks assessed/performed       Past Medical History:  Diagnosis Date  . Anxiety   . Depression     Past Surgical History:  Procedure Laterality Date  . WISDOM TOOTH EXTRACTION Bilateral 2015   All 4 extracted    There were no vitals filed for this visit.  Subjective Assessment - 08/09/17 2225    Currently in Pain?  No/denies    Pain Score  0-No pain            S:  I like to listen to different types of music to calm myself. O:  Skilled OT group focused on stress management this date.  Group opened with members identifying 6 stressful situations they have encountered.  Group weighed in on their personal level of stress with each situation.  Group discussed what causes varying levels of stress for the same situation.  Group discussed  recurrent physical and psychological stress can diminish self-esteem, decrease interpersonal and academic effectiveness and create a cycle of self-blame and self-doubt.  Group was educated on importance of identifying symptoms of stress, events are not stressful but rather our interpretations and reactions to event that are stressful.  Group member took a stress symptom checklist and discussed results.  Group discussed poor coping mechanisms including self medication, suppression,  passivity, acting out, blaming/complaining), why it may work short term, as well as possible long term complications.  Group discussed and learned positive coping mechanisms to stress including: relaxation, positive mental attitude, support, self-care and lifestyle changes, interpersonal strategies, emotional strategies, cognitive strategies, and philosophical strategies.  Group member was given handouts with 150 suggestions to decrease and manage stress, and member was able to voice 1 new strategy they will implement to manage stress.  A:  Patient engaged throuhout group, offering strategies to group members and open to stragegies introduced by others. Patient commits to saying no more often to mange his overall stress level. P:  COntinue skilled OT group intervention to improve coping skills and psychosocial skills in order to reintegrate into community. Shirlean MylarBethany H. Serafina Topham, MHA, OTR/L [phone]                OT Education - 08/09/17 2226    Education provided  Yes    Education Details  educated patient on stress management strategies     Person(s) Educated  Patient    Methods  Explanation;Handout    Comprehension  Verbalized understanding       OT Short Term Goals - 08/07/17 1723      OT SHORT TERM GOAL #1   Title  Patient will be educated on strategies to improve psychosocial skills needed to participate fully in all daily, work, and leisure  activities.    Time  3    Period  Weeks    Status  On-going      OT SHORT TERM GOAL #2   Title  Patient will be educated on a HEP and independent with implementation of HEP.    Time  3    Period  Weeks    Status  On-going      OT SHORT TERM GOAL #3   Title  Patient will independently apply psychosocial skills and coping mechanisms to her daily activities in order to function independently.    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 ADHD (attention deficit hyperactivity disorder), inattentive type 07/30/2017  . Severe recurrent major depression without psychotic features (HCC) 07/20/2017    Shirlean Mylar, MHA, OTR/L [phone]  08/09/2017, 10:27 PM  [facility] BEHAVIORAL HEALTH PARTIAL HOSPITALIZATION PROGRAM [address] [place], [place], [address] Phone: [phone]   Fax:  [phone]  Name: JULEON NARANG MRN: [id number] Date of Birth: April 01, 1996

## 2017-08-10 ENCOUNTER — Other Ambulatory Visit (HOSPITAL_COMMUNITY): Payer: No Typology Code available for payment source

## 2017-08-10 ENCOUNTER — Other Ambulatory Visit (HOSPITAL_COMMUNITY): Payer: BLUE CROSS/BLUE SHIELD | Admitting: Licensed Clinical Social Worker

## 2017-08-10 DIAGNOSIS — F900 Attention-deficit hyperactivity disorder, predominantly inattentive type: Secondary | ICD-10-CM

## 2017-08-10 DIAGNOSIS — F332 Major depressive disorder, recurrent severe without psychotic features: Secondary | ICD-10-CM

## 2017-08-10 DIAGNOSIS — F419 Anxiety disorder, unspecified: Secondary | ICD-10-CM | POA: Diagnosis not present

## 2017-08-10 NOTE — Psych (Signed)
[facility] BH PHP THERAPIST PROGRESS NOTE  Johnny Delgado [id number]  Session Time: 9:00 - 10:30   Participation Level: Active  Behavioral Response: CasualAlertAnxious  Type of Therapy: Group Therapy  Treatment Goals addressed: Coping  Interventions: CBT, DBT, Solution Focused, Supportive and Reframing  Summary:  Clinician led check-in regarding current stressors and situation, and review of patient completed daily inventory. Clinician utilized active listening and empathetic response and validated patient emotions. Clinician facilitated processing group on pertinent issues.   Therapist Response: Patient reports that he is feeling '"in a good mood." Patient rates his mood at a 8 on a scale of 1-10 with 10 being great. Patient reports he hung out with a friend, did artwork, and watched Netflix. Patient reports that he continues to struggle with managing nervousness when there is no evident reason. Patient engaged in discussion.        Session Time: 10:30 - 11:15   Participation Level: Active   Behavioral Response: CasualAlertDepressed   Type of Therapy: Group Therapy, Psychotherapy   Treatment Goals addressed: Coping   Interventions: CBT, Solution focused, Supportive, Reframing   Summary: Cln led group on hopelessness and group worked together to reframe and conceptualize hopelessness in a healthy way.    Therapist Response: Patient engaged in group. Pt was able to share ways in which he has struggled with hopelessness and how he has reframed it in the past.         Session Time: 11:15 - 12:00   Participation Level: Active   Behavioral Response: CasualAlertDepressed   Type of Therapy: Group Therapy, Psychotherapy; Psychoeducation   Treatment Goals addressed: Coping   Interventions: CBT, Solution focused, Supportive, Reframing   Summary: Cln continued topic of communication. Cln introduced "I" Statements and how to formulate them as well as common pitfalls and how  to avoid them.    Therapist Response: Patient engaged in group. Pt reports understanding of "I" Statements and successfully formulated his own in practice.            Session Time: 12:00- 1:00  Participation Level: Active  Behavioral Response: CasualAlertAnxious and Depressed  Type of Therapy: Group Therapy, Psychoeducation; Psychotherapy  Treatment Goals addressed: Coping  Interventions: CBT; Solution focused; Supportive; Reframing  Summary: 12:45 - 1:50: Clinician led assertiveness workshop in which group members shared issues with assertiveness and other group members helped problem solve the concern and respond in an assertive way.  12:50 -1:00 Clinician led check-out. Clinician assessed for immediate needs, medication compliance and efficacy, and safety concerns   Therapist Response: Patient engaged in activity and discussion. Pt shares issues with assertiveness with his father and successfully engaged in workshop At check-out, patient rates his mood at a 9 on a scale of 1-10 with 10 being great. Patient reports no plans this afternoon.  Patient demonstrates  progress as evidenced by reframing thoughts in group and reporting high mood. Patient denies SI/HI/self-harm at the end of group.       Suicidal/Homicidal: Nowithout intent/plan   Plan: Patient will discharge from PHP due to meeting treatment goals of increased focus, decreasing depression and anxiety symptoms, and self managing symptoms. Progress was measured by observation, self-report, and scales. Provider has approved discharge and patient reports alignment with discharge plan. Patient will step down to outpatient therapy and psychiatry, Patient has requested to return to his previous provider, Dr. Loreta Ave, for counseling. Patient reports he is waiting to hear back from the office to schedule an appointment. Patient is scheduled within this agency  for psychiatry with Dr. Valentino NoseHasada on 3/16. Patient denies any SI/HI at  time of discharge.   Diagnosis: Severe recurrent major depression without psychotic features (HCC) [F33.2]    1. Severe recurrent major depression without psychotic features (HCC)   2. ADHD (attention deficit hyperactivity disorder), inattentive type       Johnny GuilesJenny Mel Langan, LCSW 08/10/2017

## 2017-08-10 NOTE — Progress Notes (Signed)
  [facility] Psych Discharge Summary  Johnny Delgado [id number]  Admission date: 07/30/2017 Discharge date: 08/10/2017  Reason for admission: Depression with suicidal ideations  Per assessment note-Johnny Delgado  Has been depressed since about middle school with anxiety becoming apparent about the end of high school.  college was not successful due to the anxiety, depression and what in retrospect seemed to be ADHD.  He said his brother has been diagnosed with ADHD and he has suspected the same in him but was told over the years by his parents he did not have it.  He read his old report cards and poor concentration and follow through were regularly mentioned.  He does have trouble focusing, organizing, deciding where to start, starting and not finishing tasks, procrastinating, underachieving, rushing through work, and being regularly distracted.  Depression is sadness, no motivation, interest or energy, poor sleep and appetite, suicidal thoughts.  Anxiety is general worry.  In addition he has poor self esteem.   Childhood was okay.  No abuse or trauma.  Recent stressors were having to leave college for academic reasons, breaking up with a girlfriend, father suddenly leaving the [place] joining the [employer] and having to leave after 2 weeks due to a panic attack. He started a job recently but quit after a day or so due to demeaning treatment by his boss.Patient is a 22 y.o. Caucasian male presents with depression and anxiety.  Patient was enrolled in partial psychiatric program on 07/30/17.  Progress in Program Toward Treatment Goals: Johnny Delgado attended and participated with group session.  Reports is anxiety and focus has improved since his admission. Reports he is more motivated and is looking forward to attending [employer]. Reports he enjoy group setting and structured sessions. Patient is currently denying suicidal or homicidal ideations.     Progress (rationale): Will be ongoing. Johnny Delgado to follow-up with Therapist Loreta AveMann and [facility]  Discharge Plan:   Take all medications as prescribed. Keep all follow-up appointments as scheduled.  Do not consume alcohol or use illegal drugs while on prescription medications. Report any adverse effects from your medications to your primary care provider promptly.  In the event of recurrent symptoms or worsening symptoms, call 911, a crisis hotline, or go to the nearest emergency department for evaluation.   Oneta Rackanika N Lewis, NP 08/10/2017

## 2017-08-10 NOTE — Psych (Signed)
[facility] BH PHP THERAPIST PROGRESS NOTE  Johnny Delgado [id number]  Session Time: 9:00 - 10:30   Participation Level: Active  Behavioral Response: CasualAlertAnxious  Type of Therapy: Group Therapy  Treatment Goals addressed: Coping  Interventions: CBT, DBT, Solution Focused, Supportive and Reframing  Summary:  Clinician led check-in regarding current stressors and situation, and review of patient completed daily inventory. Clinician utilized active listening and empathetic response and validated patient emotions. Clinician facilitated processing group on pertinent issues.   Therapist Response: Patient reports that he is feeling '"positive." Patient rates his mood at a 8 on a scale of 1-10 with 10 being great. Patient reports he was pulled over by the police yesterday and was proud of the way he handled the situation. Pt shares he submitted his [employer] application and watched a movie. Patient reports that he continues to struggle with managing anxiety in high stress situations. Patient engaged in discussion.        Session Time: 10:30 -11:30   Participation Level: Active   Behavioral Response: CasualAlertEuthymic   Type of Therapy: Group Therapy, OT   Treatment Goals addressed: Coping   Interventions: Psychosocial skills training, Supportive,    Summary:  Occupational Therapy group   Therapist Response: Patient engaged in group. See OT note.           Session Time: 11:30 - 12:15   Participation Level: Active   Behavioral Response: CasualAlertAnxious   Type of Therapy: Group Therapy, Psychotherapy; Psychoeducation   Treatment Goals addressed: Coping   Interventions: CBT, Solution focused, Supportive, Reframing   Summary: Cln introduced topic of communication. Cln discussed 3 components of communication: nonverbal, words, and listening. Group discussed nonverbals: what they are and how they affect communication.    Therapist Response: Patient engaged in group. Pt  was able to give examples of nonverbals and demonstrated awareness of how her nonverbals are taken.         Session Time: 12:15 - 1:00  Participation Level: Active  Behavioral Response: CasualAlertEuthymic  Type of Therapy: Group Therapy, Activity Therapy  Treatment Goals addressed: Coping  Interventions: Psychologist, occupational, Supportive  Summary:  Reflection Group: Patients encouraged to practice skills and interpersonal techniques or work on mindfulness and relaxation techniques. The importance of self-care and making skills part of a routine to increase usage were stressed   Therapist Response: Patient engaged and participated.       Session Time: 12:45- 2:00  Participation Level: Active  Behavioral Response: CasualAlertEuthymic  Type of Therapy: Group Therapy, Psychoeducation; Psychotherapy  Treatment Goals addressed: Coping  Interventions: CBT; Solution focused; Supportive; Reframing  Summary: 12:45 - 1:50: Clinician continued topic of communication. Cln educated group on 4 communication styles: assertive, aggressive, passive, and passive-aggressive. Group gave examples of the 4 styles and identified which is their default style.  1:50 -2:00 Clinician led check-out. Clinician assessed for immediate needs, medication compliance and efficacy, and safety concerns     Therapist Response: Patient engaged activity and discussion. Pt reports default communication style of passive. Pt reports understanding of the communication styles and how they set tone for interpersonal communication.  At check-out, patient rates his mood at a 10 on a scale of 1-10 with 10 being great. Patient reports plans of going to a craft store, doing art work, and seeing a friend.  Patient demonstrates neutral progress as evidenced by continued improved energy, attention, and depression symptoms. Patient denies SI/HI/self-harm at the end of group.       Suicidal/Homicidal: Nowithout  intent/plan   Plan: Pt will continue in PHP while continuing to engage in medication management and work on increased focus, decreasing depression and anxiety symptoms, and self managing symptoms.   Diagnosis: Severe recurrent major depression without psychotic features (HCC) [F33.2]    1. Severe recurrent major depression without psychotic features (HCC)   2. ADHD (attention deficit hyperactivity disorder), inattentive type       Johnny GuilesJenny Samanatha Brammer, LCSW 08/10/2017

## 2017-08-10 NOTE — Psych (Signed)
   [facility] PHP THERAPIST PROGRESS NOTE  Johnny Delgado [id number]  Session Time: 9:00 - 10:45   Participation Level: Active  Behavioral Response: CasualAlertAnxious  Type of Therapy: Group Therapy  Treatment Goals addressed: Coping  Interventions: CBT, DBT, Solution Focused, Supportive and Reframing  Summary:  Clinician led check-in regarding current stressors and situation, and review of patient completed daily inventory. Clinician utilized active listening and empathetic response and validated patient emotions. Clinician facilitated processing group on pertinent issues.   Therapist Response: Patient arrived within time allowed and reports that he is  "good." Patient rates his mood at a "8" on a scale of 1-10 with 10 being great. Patient reports feeling productive yesterday due to finishing his FAFSA and completing almost all of his [employer] application. Pt shares he also spent time with hie brother, watched a movie and tv. Patient reports struggles with starting conversations with people in new situations.  Patient engaged in activity and discussion. Pt continues to give advice and feel the need to respond to anything another group member says in processing.     Session Time: 10:45 -12:15  Participation Level: Active  Behavioral Response: CasualAlertAnxious  Type of Therapy: Group Therapy,spiritual care  Treatment Goals addressed: Coping  Interventions: strengths based, reframing, Supportive,   Summary:  Spiritual care group  Therapist Response: Patient engaged in group. See chaplain note.         Session Time: 12:15 - 1:00  Participation Level: Active  Behavioral Response: CasualAlertAnxious  Type of Therapy: Group Therapy, Activity Therapy  Treatment Goals addressed: Coping  Interventions: Psychologist, occupationalocial Skills Training, Supportive  Summary:  Reflection Group: Patients encouraged to practice skills and interpersonal techniques or work on mindfulness and relaxation  techniques. The importance of self-care and making skills part of a routine to increase usage were stressed   Therapist Response: Patient engaged and participated appropriately.       Session Time: 1:00- 2:00  Participation Level: Active  Behavioral Response: CasualAlertEuthymic  Type of Therapy: Group Therapy, Activity therapy  Treatment Goals addressed: Coping  Interventions: relaxation training, Supportive; Reframing  Summary: 1:00 - 1:50: Relaxation group: Cln [name] led yoga group focused on retraining the body's response to stress. 1:50 -2:00 Clinician led check-out. Clinician assessed for immediate needs, medication compliance and efficacy, and safety concerns   Therapist Response: Patient engaged activity and discussion.   At check-out, patient rates his mood at a 10 on a scale of 1-10 with 10 being great. Patient reports plans to call his sister, submit his [employer] application, and maybe go to church. Patient demonstrates some progress by increased goal directed behaviors and self report of improved mood. Patient denies SI/HI/self-harm thoughts at the end of group.      Suicidal/Homicidal: Nowithout intent/plan   Plan: Pt will continue in PHP while continuing to engage in medication management and work on increased focus, decreasing depression and anxiety symptoms, and self managing symptoms.   Diagnosis: Severe recurrent major depression without psychotic features (HCC) [F33.2]    1. Severe recurrent major depression without psychotic features (HCC)   2. ADHD (attention deficit hyperactivity disorder), inattentive type       Johnny GuilesJenny Elveta Rape, LCSW 08/10/2017

## 2017-08-13 ENCOUNTER — Other Ambulatory Visit (HOSPITAL_COMMUNITY): Payer: No Typology Code available for payment source

## 2017-08-13 DIAGNOSIS — F4323 Adjustment disorder with mixed anxiety and depressed mood: Secondary | ICD-10-CM | POA: Diagnosis not present

## 2017-08-14 ENCOUNTER — Other Ambulatory Visit (HOSPITAL_COMMUNITY): Payer: No Typology Code available for payment source

## 2017-08-14 ENCOUNTER — Ambulatory Visit (HOSPITAL_COMMUNITY): Payer: No Typology Code available for payment source

## 2017-08-15 ENCOUNTER — Other Ambulatory Visit (HOSPITAL_COMMUNITY): Payer: No Typology Code available for payment source

## 2017-08-15 NOTE — Progress Notes (Signed)
Psychiatric Initial Adult Assessment   Patient Identification: Johnny Delgado MRN:  [id number] Date of Evaluation:  08/18/2017 Referral Source:  Chief Complaint:   Chief Complaint    Depression; Psychiatric Evaluation     Visit Diagnosis:    ICD-10-CM   1. MDD (major depressive disorder), recurrent episode, moderate (HCC) F33.1   2. ADHD (attention deficit hyperactivity disorder), inattentive type F90.0     History of Present Illness:   Johnny Delgado is a 22 y.o. year old male with a history of depression, who is referred for depression.  Per record, patient was admitted to [facility] in 07/2017 for worsening depression with SI of walking in to traffic. Fluoxetine was uptitrated from 20 to 40 mg. He was followed at [facility] and was started on adderall for ADHD.   He states that he has been doing very well since discharge. He believes that his medication has been working very well for him. He notices that he may be a little more irritable especially when he is driving.  He also tends to yell at his family. He has dry mouth and appetite loss. He noticed these symptoms after starting Adderall, although he reports strong preference to continue Adderall. He is applying to go back to college now that he is able to focus better.  He states that he was admitted to [facility] after he struggled with depression for several months, which worsened after quit his job. He quit his job after three days of employment as he boss/the member at [employer] was degrading to him. This person told him that the patient is worthless and made other negative comments. He walked in the traffic and waited for the car to come and his friend intervened. He denies SI since discharge. He will start a job at [employer] and feels excited about this. His mother at home has been very supportive. He reports that his father moved to [place]. Although they are not legally separated, his father does not have a plan to come back. Although he was  initially angry at him, he know thinks that it is for the sake of his father. He talks about his upbringing of how other people had certain expectation as his father was a Education officer, environmental. He also feels that he does not share the same interest with other family members while he knows that they love with each other.   He denies insomnia after starting trazodone.  He feels more motivated and has good energy. He has appetite loss. He enjoys doing Ship broker or drawing. He sold his play station as he thought it is not helpful for him. He hopes to do more exercise.  He denies SI, HI, AH. He has vague paranoia that people may follow after him. He tends to feel anxious, tense and has occasional panic attacks without triggers. He showed some movie video (horror movie) to describe how he experiences. He denies ideas of reference. He has tremendous improvement in his concentration since starting Adderall; he used to be easily distracted, cannot follow conversation, forgetful, difficulty in  sustaining on tasks. He denies alcohol use or drug use.   Per PMP,  adderall filled on 07/30/2017  I have utilized the [place] Controlled Substances Reporting System (PMP AWARxE) to confirm adherence regarding the patient's medication. My review reveals appropriate prescription fills.   Wt Readings from Last 3 Encounters:  08/18/17 179 lb 12.8 oz (81.6 kg)  08/09/17 180 lb (81.6 kg)  08/01/17 186 lb (84.4 kg)  Associated Signs/Symptoms: Depression Symptoms:  fatigue, (Hypo) Manic Symptoms:  denies decrased need for sleep, euphoria Anxiety Symptoms:  mild anxiety Psychotic Symptoms:  denies AH, VH, paranoia PTSD Symptoms: NA  Past Psychiatric History:  Outpatient: denies (depression since high school, anxiety since college) [facility] Previous suicide attempt: get into traffic, tried to slit his wrist when he was younger, denies SIB Past trials of medication: fluoxetine, Adderall History of  violence: denies  Previous Psychotropic Medications: Yes  Substance Abuse History in the last 12 months:  No.  Consequences of Substance Abuse: NA  Past Medical History:  Past Medical History:  Diagnosis Date  . ADHD (attention deficit hyperactivity disorder)   . Anxiety   . Depression     Past Surgical History:  Procedure Laterality Date  . WISDOM TOOTH EXTRACTION Bilateral 2015   All 4 extracted    Family Psychiatric History:  Mother- depression, paternal grandmother- drug use,   Family History:  Family History  Problem Relation Age of Onset  . Depression Mother   . Drug abuse Mother   . Depression Father   . Drug abuse Father   . ADD / ADHD Brother   . Drug abuse Paternal Grandmother     Social History:   Social History   Socioeconomic History  . Marital status: Single    Spouse name: None  . Number of children: None  . Years of education: None  . Highest education level: None  Social Needs  . Financial resource strain: Not very hard  . Food insecurity - worry: Never true  . Food insecurity - inability: Never true  . Transportation needs - medical: No  . Transportation needs - non-medical: No  Occupational History  . None  Tobacco Use  . Smoking status: Never Smoker  . Smokeless tobacco: Never Used  Substance and Sexual Activity  . Alcohol use: No    Frequency: Never  . Drug use: No  . Sexual activity: No  Other Topics Concern  . None  Social History Narrative  . None    Additional Social History:  [place] He reports that he is a "black sheep" of the family. He reports there was always certain expectation from others including family and people at church as his father was a Education officer, environmentalpastor. He reports good relationship with his mother and his siblings. He lives with his mother and his brother.  He is a Curatorchristian.  Education: he denies any special class through high school ("I was always a smart kid."). He was a freshman last year  and dropped out of college.   Allergies:   Allergies  Allergen Reactions  . Banana Diarrhea    Metabolic Disorder Labs: Lab Results  Component Value Date   HGBA1C 5.7 (H) 07/21/2017   MPG 116.89 07/21/2017   No results found for: PROLACTIN Lab Results  Component Value Date   CHOL 172 07/21/2017   TRIG 132 07/21/2017   HDL 32 (L) 07/21/2017   CHOLHDL 5.4 07/21/2017   VLDL 26 07/21/2017   LDLCALC 114 (H) 07/21/2017     Current Medications: Current Outpatient Medications  Medication Sig Dispense Refill  . amphetamine-dextroamphetamine (ADDERALL) 10 MG tablet Take 1 tablet (10 mg total) by mouth 2 (two) times daily with a meal. 60 tablet 0  . FLUoxetine (PROZAC) 40 MG capsule Take 1 capsule (40 mg total) by mouth daily. For mood control 30 capsule 1  . hydrOXYzine (ATARAX/VISTARIL) 10 MG tablet Take 1  tablet (10 mg total) by mouth 3 (three) times daily as needed for anxiety. 90 tablet 1  . traZODone (DESYREL) 50 MG tablet Take 1 tablet (50 mg total) by mouth at bedtime. 30 tablet 1  . [START ON 09/27/2017] amphetamine-dextroamphetamine (ADDERALL) 10 MG tablet Take 1 tablet (10 mg total) by mouth 2 (two) times daily. 60 tablet 0  . [START ON 08/27/2017] amphetamine-dextroamphetamine (ADDERALL) 10 MG tablet Take 1 tablet (10 mg total) by mouth 2 (two) times daily. 60 tablet 0   No current facility-administered medications for this visit.     Neurologic: Headache: No Seizure: No Paresthesias:No  Musculoskeletal: Strength & Muscle Tone: within normal limits Gait & Station: normal Patient leans: N/A  Psychiatric Specialty Exam: Review of Systems  Psychiatric/Behavioral: Negative for depression, hallucinations, memory loss, substance abuse and suicidal ideas. The patient is nervous/anxious. The patient does not have insomnia.   All other systems reviewed and are negative.   Blood pressure 120/68, pulse (!) 102, height 5\' 10"  (1.778 m), weight 179 lb 12.8 oz (81.6 kg).Body  mass index is 25.8 kg/m.  General Appearance: Fairly Groomed  Eye Contact:  Good  Speech:  Clear and Coherent  Volume:  Normal  Mood:  "better"  Affect:  Appropriate, Congruent and slightly restricted, calm  Thought Process:  Coherent and Goal Directed  Orientation:  Full (Time, Place, and Person)  Thought Content:  Logical  Suicidal Thoughts:  No  Homicidal Thoughts:  No  Memory:  Immediate;   Good Recent;   Good Remote;   Good  Judgement:  Good  Insight:  Fair  Psychomotor Activity:  Normal  Concentration:  Concentration: Good and Attention Span: Good  Recall:  Good  Fund of Knowledge:Good  Language: Good  Akathisia:  No  Handed:  Right  AIMS (if indicated):  N/A  Assets:  Communication Skills Desire for Improvement  ADL's:  Intact  Cognition: WNL  Sleep:  good   Assessment Johnny Delgado is a 22 y.o. year old male with a history of depression, who is referred for after care for severe depression (admitted to [facility] in 07/2017, followed by PCP)  # MDD, moderate, recurrent without psychotic features Patient reports significant improvement in his mood symptoms since discharge and up titration of fluoxetine. Psychosocial stressors include discordance with his boss at the previous work and unemployment (he will start work in a few days). He also reports stress secondary to certain expectation from others since child. Will continue current dose of fluoxetine to target mood symptoms.  Will continue hydroxyzine as needed for anxiety and trazodone for sleep. Discussed behavioral activation. He will greatly benefit from CBT; he is encouraged to continue to see his therapist.   # ADHD Diagnosed with ADHD and was recently started on Adderall. He reports significant improvement in his inattention. Although he reports side effect of irritability, appetite loss,  dry mouth (which may be due to fluoxetine), he has strong preference to stay on current medication. He is not interested in  switching to long acting stimulant at this time. Will closely monitor his side effect. Discussed potential risk of dependence, worsening anxiety, decreased appetite, insomnia.   Plan 1. Continue fluoxetine 40 mg daily  2. Continue Adderall 10 mg twice a day  3. Continue Trazodone 50 mg at night  4. Continue hydroxyzine 10 mg three time as needed for anxiety  5. Continue therapy  The patient demonstrates the following risk factors for suicide: Chronic risk factors for suicide include: psychiatric disorder  of depression and previous suicide attempts of walking into the traffic. Acute risk factors for suicide include: family or marital conflict, recent psychiatry admission. Protective factors for this patient include: positive social support, coping skills and hope for the future. Considering these factors, the overall suicide risk at this point appears to be moderate, but not at imminent danger to self. Patient is appropriate for outpatient follow up.   Treatment Plan Summary: Plan as above   Neysa Hotter, MD 3/16/20191:51 PM

## 2017-08-16 ENCOUNTER — Ambulatory Visit (HOSPITAL_COMMUNITY): Payer: No Typology Code available for payment source

## 2017-08-16 ENCOUNTER — Other Ambulatory Visit (HOSPITAL_COMMUNITY): Payer: No Typology Code available for payment source

## 2017-08-17 ENCOUNTER — Other Ambulatory Visit (HOSPITAL_COMMUNITY): Payer: No Typology Code available for payment source

## 2017-08-17 ENCOUNTER — Encounter (HOSPITAL_COMMUNITY): Payer: Self-pay | Admitting: Occupational Therapy

## 2017-08-17 NOTE — Therapy (Signed)
[facility] [place], [place], [address] Phone: [phone]   Fax:  [phone]  Patient Details  Name: Johnny Delgado MRN: [id number] Date of Birth: 12-May-1996 Referring Provider:  No ref. provider found  Encounter Date: 08/17/2017   OCCUPATIONAL THERAPY DISCHARGE SUMMARY  Visits from Start of Care: 3  Current functional level related to goals / functional outcomes: Pt completed 3/6 OT groups during time in [facility] program. Pt was an active participate and demonstrates interest in groups and coping skills learned. Pt discharged from [facility] and OT groups.    Remaining deficits: Implementing coping skills   Education / Equipment: Strategies and techniques for implementing learned skills.  Plan: Patient agrees to discharge.  Patient goals were partially met. Patient is being discharged due to being pleased with the current functional level.  ?????   Pt is discharging from PHP.      Guadelupe Sabin, OTR/L  [phone] 08/17/2017, 9:13 AM  [facility] [address] [place], [place], [address] Phone: [phone]   Fax:  [phone]

## 2017-08-18 ENCOUNTER — Encounter (HOSPITAL_COMMUNITY): Payer: Self-pay | Admitting: Psychiatry

## 2017-08-18 ENCOUNTER — Ambulatory Visit (INDEPENDENT_AMBULATORY_CARE_PROVIDER_SITE_OTHER): Payer: BLUE CROSS/BLUE SHIELD | Admitting: Psychiatry

## 2017-08-18 VITALS — BP 120/68 | HR 102 | Ht 70.0 in | Wt 179.8 lb

## 2017-08-18 DIAGNOSIS — F419 Anxiety disorder, unspecified: Secondary | ICD-10-CM

## 2017-08-18 DIAGNOSIS — Z813 Family history of other psychoactive substance abuse and dependence: Secondary | ICD-10-CM | POA: Diagnosis not present

## 2017-08-18 DIAGNOSIS — R450 Nervousness: Secondary | ICD-10-CM | POA: Diagnosis not present

## 2017-08-18 DIAGNOSIS — Z818 Family history of other mental and behavioral disorders: Secondary | ICD-10-CM | POA: Diagnosis not present

## 2017-08-18 DIAGNOSIS — F900 Attention-deficit hyperactivity disorder, predominantly inattentive type: Secondary | ICD-10-CM

## 2017-08-18 DIAGNOSIS — G4700 Insomnia, unspecified: Secondary | ICD-10-CM | POA: Diagnosis not present

## 2017-08-18 DIAGNOSIS — F331 Major depressive disorder, recurrent, moderate: Secondary | ICD-10-CM

## 2017-08-18 MED ORDER — AMPHETAMINE-DEXTROAMPHETAMINE 10 MG PO TABS: 10.0000 mg | ORAL_TABLET | Freq: Two times a day (BID) | ORAL | 0 refills | Status: DC

## 2017-08-18 MED ORDER — HYDROXYZINE HCL 10 MG PO TABS: 10.0000 mg | ORAL_TABLET | Freq: Three times a day (TID) | ORAL | 1 refills | Status: DC | PRN

## 2017-08-18 MED ORDER — TRAZODONE HCL 50 MG PO TABS: 50.0000 mg | ORAL_TABLET | Freq: Every day | ORAL | 1 refills | Status: DC

## 2017-08-18 MED ORDER — FLUOXETINE HCL 40 MG PO CAPS: 40.0000 mg | ORAL_CAPSULE | Freq: Every day | ORAL | 1 refills | Status: DC

## 2017-08-18 NOTE — Patient Instructions (Signed)
1. Continue fluoxetine 40 mg daily  2. Continue Adderall 10 mg twice a day  3. Continue Trazodone 50 mg at night  4. Continue hydroxyzine 10 mg three time as needed for anxiety  5. Continue therapy

## 2017-08-20 ENCOUNTER — Other Ambulatory Visit (HOSPITAL_COMMUNITY): Payer: No Typology Code available for payment source

## 2017-08-21 ENCOUNTER — Other Ambulatory Visit (HOSPITAL_COMMUNITY): Payer: No Typology Code available for payment source

## 2017-08-21 ENCOUNTER — Ambulatory Visit (HOSPITAL_COMMUNITY): Payer: No Typology Code available for payment source

## 2017-08-22 ENCOUNTER — Other Ambulatory Visit (HOSPITAL_COMMUNITY): Payer: No Typology Code available for payment source

## 2017-08-23 ENCOUNTER — Ambulatory Visit (HOSPITAL_COMMUNITY): Payer: No Typology Code available for payment source

## 2017-08-23 ENCOUNTER — Other Ambulatory Visit (HOSPITAL_COMMUNITY): Payer: No Typology Code available for payment source

## 2017-08-24 ENCOUNTER — Other Ambulatory Visit (HOSPITAL_COMMUNITY): Payer: No Typology Code available for payment source

## 2017-08-27 ENCOUNTER — Other Ambulatory Visit (HOSPITAL_COMMUNITY): Payer: No Typology Code available for payment source

## 2017-08-28 ENCOUNTER — Other Ambulatory Visit (HOSPITAL_COMMUNITY): Payer: No Typology Code available for payment source

## 2017-08-28 ENCOUNTER — Ambulatory Visit (HOSPITAL_COMMUNITY): Payer: No Typology Code available for payment source

## 2017-08-29 ENCOUNTER — Other Ambulatory Visit (HOSPITAL_COMMUNITY): Payer: No Typology Code available for payment source

## 2017-08-30 ENCOUNTER — Ambulatory Visit (HOSPITAL_COMMUNITY): Payer: No Typology Code available for payment source

## 2017-08-30 ENCOUNTER — Other Ambulatory Visit (HOSPITAL_COMMUNITY): Payer: No Typology Code available for payment source

## 2017-08-31 ENCOUNTER — Other Ambulatory Visit (HOSPITAL_COMMUNITY): Payer: No Typology Code available for payment source

## 2017-09-02 ENCOUNTER — Other Ambulatory Visit (HOSPITAL_COMMUNITY): Payer: Self-pay | Admitting: Family

## 2017-09-03 ENCOUNTER — Other Ambulatory Visit (HOSPITAL_COMMUNITY): Payer: No Typology Code available for payment source

## 2017-09-04 ENCOUNTER — Other Ambulatory Visit (HOSPITAL_COMMUNITY): Payer: No Typology Code available for payment source

## 2017-09-05 ENCOUNTER — Other Ambulatory Visit (HOSPITAL_COMMUNITY): Payer: No Typology Code available for payment source

## 2017-09-06 ENCOUNTER — Other Ambulatory Visit (HOSPITAL_COMMUNITY): Payer: No Typology Code available for payment source

## 2017-09-07 ENCOUNTER — Other Ambulatory Visit (HOSPITAL_COMMUNITY): Payer: No Typology Code available for payment source

## 2017-09-28 ENCOUNTER — Encounter (HOSPITAL_BASED_OUTPATIENT_CLINIC_OR_DEPARTMENT_OTHER): Payer: Self-pay | Admitting: *Deleted

## 2017-09-28 ENCOUNTER — Emergency Department (HOSPITAL_BASED_OUTPATIENT_CLINIC_OR_DEPARTMENT_OTHER)
Admission: EM | Admit: 2017-09-28 | Discharge: 2017-09-28 | Disposition: A | Discharge status: Home / Self Care | Payer: BLUE CROSS/BLUE SHIELD | Attending: Emergency Medicine | Admitting: Emergency Medicine

## 2017-09-28 ENCOUNTER — Other Ambulatory Visit: Payer: Self-pay

## 2017-09-28 ENCOUNTER — Emergency Department (HOSPITAL_BASED_OUTPATIENT_CLINIC_OR_DEPARTMENT_OTHER): Payer: BLUE CROSS/BLUE SHIELD

## 2017-09-28 DIAGNOSIS — R404 Transient alteration of awareness: Secondary | ICD-10-CM | POA: Diagnosis not present

## 2017-09-28 DIAGNOSIS — R410 Disorientation, unspecified: Secondary | ICD-10-CM | POA: Diagnosis not present

## 2017-09-28 DIAGNOSIS — Z79899 Other long term (current) drug therapy: Secondary | ICD-10-CM | POA: Diagnosis not present

## 2017-09-28 DIAGNOSIS — R55 Syncope and collapse: Secondary | ICD-10-CM | POA: Diagnosis not present

## 2017-09-28 LAB — URINALYSIS, MICROSCOPIC (REFLEX)

## 2017-09-28 LAB — URINALYSIS, ROUTINE W REFLEX MICROSCOPIC
BILIRUBIN URINE: NEGATIVE
Glucose, UA: NEGATIVE mg/dL
Hgb urine dipstick: NEGATIVE
KETONES UR: NEGATIVE mg/dL
LEUKOCYTES UA: NEGATIVE
NITRITE: NEGATIVE
Protein, ur: NEGATIVE mg/dL
SPECIFIC GRAVITY, URINE: 1.02 (ref 1.005–1.030)
pH: 7.5 (ref 5.0–8.0)

## 2017-09-28 LAB — RAPID URINE DRUG SCREEN, HOSP PERFORMED
Amphetamines: POSITIVE — AB
BENZODIAZEPINES: NOT DETECTED
Barbiturates: NOT DETECTED
COCAINE: NOT DETECTED
OPIATES: NOT DETECTED
TETRAHYDROCANNABINOL: NOT DETECTED

## 2017-09-28 LAB — CBC WITH DIFFERENTIAL/PLATELET
BASOS ABS: 0 10*3/uL (ref 0.0–0.1)
BASOS PCT: 0 %
Eosinophils Absolute: 0.2 10*3/uL (ref 0.0–0.7)
Eosinophils Relative: 2 %
HEMATOCRIT: 41.6 % (ref 39.0–52.0)
HEMOGLOBIN: 14.7 g/dL (ref 13.0–17.0)
Lymphocytes Relative: 36 %
Lymphs Abs: 2.5 10*3/uL (ref 0.7–4.0)
MCH: 29.5 pg (ref 26.0–34.0)
MCHC: 35.3 g/dL (ref 30.0–36.0)
MCV: 83.5 fL (ref 78.0–100.0)
MONOS PCT: 10 %
Monocytes Absolute: 0.6 10*3/uL (ref 0.1–1.0)
NEUTROS ABS: 3.5 10*3/uL (ref 1.7–7.7)
NEUTROS PCT: 52 %
Platelets: 85 10*3/uL — ABNORMAL LOW (ref 150–400)
RBC: 4.98 MIL/uL (ref 4.22–5.81)
RDW: 12.5 % (ref 11.5–15.5)
WBC: 6.8 10*3/uL (ref 4.0–10.5)

## 2017-09-28 LAB — COMPREHENSIVE METABOLIC PANEL
ALBUMIN: 4.4 g/dL (ref 3.5–5.0)
ALK PHOS: 49 U/L (ref 38–126)
ALT: 25 U/L (ref 17–63)
ANION GAP: 8 (ref 5–15)
AST: 24 U/L (ref 15–41)
BILIRUBIN TOTAL: 0.8 mg/dL (ref 0.3–1.2)
BUN: 13 mg/dL (ref 6–20)
CALCIUM: 9.3 mg/dL (ref 8.9–10.3)
CO2: 24 mmol/L (ref 22–32)
Chloride: 107 mmol/L (ref 101–111)
Creatinine, Ser: 0.92 mg/dL (ref 0.61–1.24)
GLUCOSE: 69 mg/dL (ref 65–99)
POTASSIUM: 4 mmol/L (ref 3.5–5.1)
Sodium: 139 mmol/L (ref 135–145)
TOTAL PROTEIN: 7.6 g/dL (ref 6.5–8.1)

## 2017-09-28 LAB — TROPONIN I: Troponin I: <0.03 ng/mL (ref <0.03)

## 2017-09-28 LAB — SALICYLATE LEVEL: Salicylate Lvl: <7 mg/dL (ref 2.8–30.0)

## 2017-09-28 LAB — ACETAMINOPHEN LEVEL: Acetaminophen (Tylenol), Serum: <10 ug/mL — ABNORMAL LOW (ref 10–30)

## 2017-09-28 LAB — ETHANOL

## 2017-09-28 LAB — D-DIMER, QUANTITATIVE (NOT AT ARMC): D DIMER QUANT: 0.41 ug{FEU}/mL (ref 0.00–0.50)

## 2017-09-28 LAB — CBG MONITORING, ED: GLUCOSE-CAPILLARY: 106 mg/dL — AB (ref 65–99)

## 2017-09-28 MED ORDER — SODIUM CHLORIDE 0.9 % IV BOLUS
1000.0000 mL | Freq: Once | INTRAVENOUS | Status: AC
  Administered 2017-09-28: 1000 mL via INTRAVENOUS

## 2017-09-28 NOTE — Discharge Instructions (Addendum)
Follow-up with your primary care provider for further evaluation. Do not drink alcohol or drive before being evaluated by your primary care provider.  Take your medications as prescribed.

## 2017-09-28 NOTE — ED Provider Notes (Signed)
[facility] EMERGENCY DEPARTMENT Provider Note   CSN: [id number] Arrival date & time: 09/28/17  1132     History   Chief Complaint Chief Complaint  Patient presents with  . Loss of Consciousness    HPI Johnny Delgado is a 22 y.o. male with past medical history of ADHD, anxiety, depression, who presents to ED for evaluation of syncopal episode that occurred earlier this morning while he was at work.  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  Unknown if he had a head injury.  He was unconscious for an unknown amount of time.  Mother states that when she arrived here, he was in the ambulance van and conscious.  However, she states that he has had mild confusion and delayed responses.  She states that he has a history of anxiety attacks after which he also has some confusion and delayed responses.  However, she denies prior history of syncope.  Patient has recently started Adderall, Prozac, Vistaril.  She states she usually gives the patient these medications every morning.  However, this morning she forgot and told him to take his medications on his own.  She also states that yesterday patient had a "late night" but denies any other changes to his activity.  At the time of his diagnosis of anxiety and depression, patient did endorse suicidal ideations.  She denies any prior suicide attempts.  Patient denies any HI, AVH, alcohol, tobacco or other drug use, chest pain, shortness of breath, hemoptysis, recent surgeries, family history of sudden cardiac death at a young age, history of DVT or PE.  Mother does state that [place]  this weekend.  HPI  Past Medical History:  Diagnosis Date  . ADHD (attention deficit hyperactivity disorder)   . Anxiety   . Depression     Patient Active Problem List   Diagnosis Date Noted  . ADHD (attention deficit hyperactivity disorder), inattentive type 07/30/2017  . Severe  recurrent major depression without psychotic features (HCC) 07/20/2017    Past Surgical History:  Procedure Laterality Date  . WISDOM TOOTH EXTRACTION Bilateral 2015   All 4 extracted        Home Medications    Prior to Admission medications   Medication Sig Start Date End Date Taking? Authorizing Provider  amphetamine-dextroamphetamine (ADDERALL) 10 MG tablet Take 1 tablet (10 mg total) by mouth 2 (two) times daily with a meal. 07/30/17 07/30/18  Benjaman Pottaylor, Gerald D, MD  amphetamine-dextroamphetamine (ADDERALL) 10 MG tablet Take 1 tablet (10 mg total) by mouth 2 (two) times daily. 09/27/17 10/27/17  Neysa HotterHisada, Reina, MD  amphetamine-dextroamphetamine (ADDERALL) 10 MG tablet Take 1 tablet (10 mg total) by mouth 2 (two) times daily. 08/27/17 09/26/17  Neysa HotterHisada, Reina, MD  FLUoxetine (PROZAC) 40 MG capsule Take 1 capsule (40 mg total) by mouth daily. For mood control 08/18/17   Neysa HotterHisada, Reina, MD  hydrOXYzine (ATARAX/VISTARIL) 10 MG tablet Take 1 tablet (10 mg total) by mouth 3 (three) times daily as needed for anxiety. 08/18/17   Neysa HotterHisada, Reina, MD  traZODone (DESYREL) 50 MG tablet Take 1 tablet (50 mg total) by mouth at bedtime. 08/18/17   Neysa HotterHisada, Reina, MD    Family History Family History  Problem Relation Age of Onset  . Depression Mother   . Drug abuse Mother   . Depression Father   . Drug abuse Father   . ADD / ADHD Brother   . Drug abuse Paternal  Grandmother     Social History Social History   Tobacco Use  . Smoking status: Never Smoker  . Smokeless tobacco: Never Used  Substance Use Topics  . Alcohol use: No    Frequency: Never  . Drug use: No     Allergies   Banana   Review of Systems Review of Systems  Constitutional: Negative for appetite change, chills and fever.  HENT: Negative for ear pain, rhinorrhea, sneezing and sore throat.   Eyes: Negative for photophobia and visual disturbance.  Respiratory: Negative for cough, chest tightness, shortness of breath and wheezing.    Cardiovascular: Negative for chest pain and palpitations.  Gastrointestinal: Negative for abdominal pain, blood in stool, constipation, diarrhea, nausea and vomiting.  Genitourinary: Negative for dysuria, hematuria and urgency.  Musculoskeletal: Negative for myalgias.  Skin: Negative for rash.  Neurological: Positive for syncope. Negative for dizziness, weakness and light-headedness.  Psychiatric/Behavioral: Positive for confusion. Negative for self-injury. The patient is nervous/anxious.      Physical Exam Updated Vital Signs BP 112/71   Pulse 83   Temp 98.3 F (36.8 C) (Oral)   Resp 20   Ht 5\' 10"  (1.778 m)   Wt 81.6 kg (180 lb)   SpO2 98%   BMI 25.83 kg/m   Physical Exam  Constitutional: He is oriented to person, place, and time. He appears well-developed and well-nourished. No distress.  HENT:  Head: Normocephalic and atraumatic.  Nose: Nose normal.  Eyes: Pupils are equal, round, and reactive to light. Conjunctivae and EOM are normal. Right eye exhibits no discharge. Left eye exhibits no discharge. No scleral icterus.  Neck: Normal range of motion. Neck supple.  Cardiovascular: Normal rate, regular rhythm, normal heart sounds and intact distal pulses. Exam reveals no gallop and no friction rub.  No murmur heard. Pulmonary/Chest: Effort normal and breath sounds normal. No respiratory distress.  Abdominal: Soft. Bowel sounds are normal. He exhibits no distension. There is no tenderness. There is no guarding.  Musculoskeletal: Normal range of motion. He exhibits no edema.  No lower extremity edema, erythema or calf tenderness bilaterally.  Neurological: He is alert and oriented to person, place, and time. No cranial nerve deficit or sensory deficit. He exhibits normal muscle tone. Coordination normal.  Alert and oriented to self, family members, place, time and situation.  Patient with delayed response time.  Pupils reactive. No facial asymmetry noted. Cranial nerves appear  grossly intact. Sensation intact to light touch on face, BUE and BLE. Strength 5/5 in BUE and BLE.   Skin: Skin is warm and dry. No rash noted.  Psychiatric: He has a normal mood and affect.  Nursing note and vitals reviewed.    ED Treatments / Results  Labs (all labs ordered are listed, but only abnormal results are displayed) Labs Reviewed  ACETAMINOPHEN LEVEL - Abnormal; Notable for the following components:      Result Value   Acetaminophen (Tylenol), Serum <10 (*)    All other components within normal limits  CBC WITH DIFFERENTIAL/PLATELET - Abnormal; Notable for the following components:   Platelets 85 (*)    All other components within normal limits  URINALYSIS, ROUTINE W REFLEX MICROSCOPIC - Abnormal; Notable for the following components:   APPearance TURBID (*)    All other components within normal limits  RAPID URINE DRUG SCREEN, HOSP PERFORMED - Abnormal; Notable for the following components:   Amphetamines POSITIVE (*)    All other components within normal limits  URINALYSIS, MICROSCOPIC (REFLEX) - Abnormal; Notable  for the following components:   Bacteria, UA FEW (*)    All other components within normal limits  CBG MONITORING, ED - Abnormal; Notable for the following components:   Glucose-Capillary 106 (*)    All other components within normal limits  COMPREHENSIVE METABOLIC PANEL  ETHANOL  SALICYLATE LEVEL  TROPONIN I  D-DIMER, QUANTITATIVE (NOT AT [facility])    EKG EKG Interpretation  Date/Time:  Friday September 28 2017 11:44:09 EDT Ventricular Rate:  76 PR Interval:    QRS Duration: 100 QT Interval:  377 QTC Calculation: 424 R Axis:   63 Text Interpretation:  Sinus rhythm Borderline short PR interval Abnormal Q suggests inferior infarct no prior to compare with Confirmed by Meridee Score [id number]) on 09/28/2017 12:11:30 PM   Radiology Ct Head Wo Contrast  Result Date: 09/28/2017 CLINICAL DATA:  Syncopal episode today. EXAM: CT HEAD WITHOUT CONTRAST  TECHNIQUE: Contiguous axial images were obtained from the base of the skull through the vertex without intravenous contrast. COMPARISON:  None. FINDINGS: Brain: No evidence of acute infarction, hemorrhage, hydrocephalus, extra-axial collection or mass lesion/mass effect. Vascular: No hyperdense vessel or unexpected calcification. Skull: Normal. Negative for fracture or focal lesion. Sinuses/Orbits: Negative. Other: None. IMPRESSION: Normal head CT. Electronically Signed   By: Drusilla Kanner M.D.   On: 09/28/2017 14:51    Procedures Procedures (including critical care time)  Medications Ordered in ED Medications  sodium chloride 0.9 % bolus 1,000 mL (0 mLs Intravenous Stopped 09/28/17 1421)     Initial Impression / Assessment and Plan / ED Course  I have reviewed the triage vital signs and the nursing notes.  Pertinent labs & imaging results that were available during my care of the patient were reviewed by me and considered in my medical decision making (see chart for details).     Patient comes with a past medical history of ADHD, anxiety, depression all recently diagnosed, who presents to ED for evaluation of syncopal episode that occurred earlier this morning while at work.  It was witnessed but unable to recall if he had a head injury.  Mother states that since arrival in the ED, he has regained consciousness but has had mild confusion and delayed responses.  She states that he has history of anxiety attacks at present with similar confusion and delayed responses after.  However, she denies prior history of syncope.  He was recently started on Adderall, Prozac and Vistaril.  Mother states that today was the one day where she did not give him his medications herself but instead told him to get his medications from the bottle.  He denies any SI, HI, AVH currently.  He did have some delayed response time to my initial evaluation.  I did think there was some component of psychiatric because of  this.  He has no deficits on his neurological examination.  No signs of head injury noted.  He is alert and oriented x4.  Lab work including CBC, BMP, salicylate level, ethanol level, troponin, d-dimer, acetaminophen level, urinalysis unremarkable.  CBC normal.  Urine drug screen positive for amphetamines, consistent with known history of Adderall use.  CT of the head was negative.  EKG with borderline short PR interval.  Patient given fluids with significant improvement in his symptoms.  Mother states that he has gone back to his baseline cognition.  He is tolerating p.o. intake without difficulty.  I told the patient of his reassuring lab work and imaging studies.  Unsure if this was caused by arrhythmia.  No signs of PE as cause of symptoms.  I encouraged him to follow-up with his PCP, refrain from driving or alcohol use until evaluated by his PCP.  Also encouraged him to continue his home medications only as previously prescribed.  Patient and mother are comfortable with this plan.  Advised to return for any severe worsening symptoms. Patient discussed with my attending, Dr. Charm Barges.  Portions of this note were generated with Scientist, clinical (histocompatibility and immunogenetics). Dictation errors may occur despite best attempts at proofreading.   Final Clinical Impressions(s) / ED Diagnoses   Final diagnoses:  Syncope and collapse    ED Discharge Orders    None       Dietrich Pates, PA-C 09/28/17 1524    Terrilee Files, MD 09/29/17 1750

## 2017-09-28 NOTE — ED Notes (Signed)
Patient transported to CT 

## 2017-09-28 NOTE — ED Triage Notes (Signed)
Pt to room 5 by ems, reporting pt had witnessed syncopal episode while at work. Pt is a/a/ox4, slow to answer questions, and crying. Mother at bedside. fsbs and ekg performed while pt being triaged.

## 2017-10-03 ENCOUNTER — Ambulatory Visit (HOSPITAL_COMMUNITY): Payer: No Typology Code available for payment source | Admitting: Psychiatry

## 2017-10-15 ENCOUNTER — Other Ambulatory Visit (HOSPITAL_COMMUNITY): Payer: Self-pay | Admitting: Psychiatry

## 2017-10-15 MED ORDER — FLUOXETINE HCL 40 MG PO CAPS: 40.0000 mg | ORAL_CAPSULE | Freq: Every day | ORAL | 0 refills | Status: DC

## 2017-10-15 MED ORDER — TRAZODONE HCL 50 MG PO TABS: 50.0000 mg | ORAL_TABLET | Freq: Every day | ORAL | 0 refills | Status: DC

## 2017-10-19 ENCOUNTER — Ambulatory Visit (INDEPENDENT_AMBULATORY_CARE_PROVIDER_SITE_OTHER): Payer: BLUE CROSS/BLUE SHIELD | Admitting: Psychiatry

## 2017-10-19 ENCOUNTER — Encounter (HOSPITAL_COMMUNITY): Payer: Self-pay | Admitting: Psychiatry

## 2017-10-19 VITALS — BP 128/84 | HR 80 | Ht 70.0 in | Wt 187.8 lb

## 2017-10-19 DIAGNOSIS — F0789 Other personality and behavioral disorders due to known physiological condition: Secondary | ICD-10-CM | POA: Diagnosis not present

## 2017-10-19 DIAGNOSIS — Z818 Family history of other mental and behavioral disorders: Secondary | ICD-10-CM

## 2017-10-19 DIAGNOSIS — F410 Panic disorder [episodic paroxysmal anxiety] without agoraphobia: Secondary | ICD-10-CM | POA: Diagnosis not present

## 2017-10-19 DIAGNOSIS — F603 Borderline personality disorder: Secondary | ICD-10-CM | POA: Diagnosis not present

## 2017-10-19 DIAGNOSIS — G4700 Insomnia, unspecified: Secondary | ICD-10-CM

## 2017-10-19 DIAGNOSIS — Z915 Personal history of self-harm: Secondary | ICD-10-CM

## 2017-10-19 DIAGNOSIS — Z813 Family history of other psychoactive substance abuse and dependence: Secondary | ICD-10-CM

## 2017-10-19 DIAGNOSIS — F331 Major depressive disorder, recurrent, moderate: Secondary | ICD-10-CM | POA: Diagnosis not present

## 2017-10-19 DIAGNOSIS — F332 Major depressive disorder, recurrent severe without psychotic features: Secondary | ICD-10-CM

## 2017-10-19 MED ORDER — FLUOXETINE HCL 40 MG PO CAPS: 40.0000 mg | ORAL_CAPSULE | Freq: Every day | ORAL | 0 refills | Status: DC

## 2017-10-19 MED ORDER — HYDROXYZINE HCL 10 MG PO TABS: 10.0000 mg | ORAL_TABLET | Freq: Three times a day (TID) | ORAL | 1 refills | Status: DC | PRN

## 2017-10-19 MED ORDER — AMPHETAMINE-DEXTROAMPHETAMINE 10 MG PO TABS: 10.0000 mg | ORAL_TABLET | Freq: Two times a day (BID) | ORAL | 0 refills | Status: DC

## 2017-10-19 MED ORDER — QUETIAPINE FUMARATE 100 MG PO TABS: 100.0000 mg | ORAL_TABLET | Freq: Every day | ORAL | 0 refills | Status: DC

## 2017-10-19 NOTE — Progress Notes (Signed)
BH MD/PA/NP OP Progress Note  10/19/2017 10:50 AM Johnny Delgado  MRN:  [id number]  Chief Complaint: med management, depression, cut myself HPI: Johnny Delgado is a 22 year old male with a psychiatric history consistent with borderline personality disorder.  He was seen for psychiatric intake with Dr. Vanetta Shawl and presents today for a ongoing transition of care.  He was seen on a Saturday for psychiatric intake.  He was recently psychiatrically hospitalized and participated in the [facility] program.  He reports that he engaged in an episode of cutting last week because he felt numb and he wanted to feel something.  I spent time with the patient educating him on borderline personality disorder and providing him educational material.  I highlighted the importance of dialectical behavioral therapy.  He is quite opposed to individual or group therapy at this time.  I encouraged him to read about this diagnosis and reconsider.  He was started on Adderall for inattentive symptoms possibly concerning for ADHD.  We agreed to continue the current dose of Adderall given his reports of some benefit.  He does not feel more anxious or irritable with Adderall.  He continues to have trouble sleeping and feels the trazodone has not been long-term effective.  I educated him on the risk and benefits of Seroquel and atypical antipsychotics and suggested we use Seroquel as an augmenting agent and to also help him with his sleep difficulties.Marland Kitchen  He continues with chronic suicidal thoughts but has no intentions at this time.  He reports that his family has taken measures to lock away and hide all firearms, and his mother continues to provide consistent support and oversight.  He will be leaving to go to college in August.  I informed the patient that once he leaves in August he will need to establish psychiatric care at [facility] or locally in the community given that he will be living approximately 4-5 hours  away.  Visit Diagnosis:    ICD-10-CM   1. MDD (major depressive disorder), recurrent episode, moderate (HCC) F33.1 FLUoxetine (PROZAC) 40 MG capsule    QUEtiapine (SEROQUEL) 100 MG tablet    amphetamine-dextroamphetamine (ADDERALL) 10 MG tablet  2. Severe recurrent major depression without psychotic features (HCC) F33.2 FLUoxetine (PROZAC) 40 MG capsule    QUEtiapine (SEROQUEL) 100 MG tablet    amphetamine-dextroamphetamine (ADDERALL) 10 MG tablet  3. 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 Borderline personality disorder in adult [facility]) F60.3     Past Psychiatric History: See intake H&P for full details. Reviewed, with no updates at this time.   Past Medical History:  Past Medical History:  Diagnosis Date  . ADHD (attention deficit hyperactivity disorder)   . Anxiety   . Depression     Past Surgical History:  Procedure Laterality Date  . WISDOM TOOTH EXTRACTION Bilateral 2015   All 4 extracted    Family Psychiatric History: See intake H&P for full details. Reviewed, with no updates at this time.   Family History:  Family History  Problem Relation Age of Onset  . Depression Mother   . Drug abuse Mother   . Depression Father   . Drug abuse Father   . ADD / ADHD Brother   . Drug abuse Paternal Grandmother     Social History:  Social History   Socioeconomic History  .  Marital status: Single    Spouse name: Not on file  . Number of children: Not on file  . Years of education: Not on file  . Highest education level: Not on file  Occupational History  . Not on file  Social Needs  . Financial resource strain: Not very hard  . Food insecurity:    Worry: Never true    Inability: Never true  . Transportation needs:    Medical: No    Non-medical: No  Tobacco Use  . Smoking status:  Never Smoker  . Smokeless tobacco: Never Used  Substance and Sexual Activity  . Alcohol use: No    Frequency: Never  . Drug use: No  . Sexual activity: Never  Lifestyle  . Physical activity:    Days per week: 3 days    Minutes per session: 30 min  . Stress: To some extent  Relationships  . Social connections:    Talks on phone: More than three times a week    Gets together: Twice a week    Attends religious service: More than 4 times per year    Active member of club or organization: No    Attends meetings of clubs or organizations: Never    Relationship status: Never married  Other Topics Concern  . Not on file  Social History Narrative  . Not on file    Allergies:  Allergies  Allergen Reactions  . Banana Diarrhea    Metabolic Disorder Labs: Lab Results  Component Value Date   HGBA1C 5.7 (H) 07/21/2017   MPG 116.89 07/21/2017   No results found for: PROLACTIN Lab Results  Component Value Date   CHOL 172 07/21/2017   TRIG 132 07/21/2017   HDL 32 (L) 07/21/2017   CHOLHDL 5.4 07/21/2017   VLDL 26 07/21/2017   LDLCALC 114 (H) 07/21/2017   Lab Results  Component Value Date   TSH 1.162 07/21/2017    Therapeutic Level Labs: No results found for: LITHIUM No results found for: VALPROATE No components found for:  CBMZ  Current Medications: Current Outpatient Medications  Medication Sig Dispense Refill  . FLUoxetine (PROZAC) 40 MG capsule Take 1 capsule (40 mg total) by mouth daily. For mood control 90 capsule 0  . hydrOXYzine (ATARAX/VISTARIL) 10 MG tablet Take 1 tablet (10 mg total) by mouth 3 (three) times daily as needed for anxiety. 90 tablet 1  . amphetamine-dextroamphetamine (ADDERALL) 10 MG tablet Take 1 tablet (10 mg total) by mouth 2 (two) times daily with a meal. 60 tablet 0  . QUEtiapine (SEROQUEL) 100 MG tablet Take 1 tablet (100 mg total) by mouth at bedtime. Take 1/2 tablet for the first night, then increase to full tablet 90 tablet 0   No  current facility-administered medications for this visit.      Musculoskeletal: Strength & Muscle Tone: within normal limits Gait & Station: normal Patient leans: N/A  Psychiatric Specialty Exam: ROS  Blood pressure 128/84, pulse 80, height  (1.778 m), weight 187 lb 12.8 oz (85.2 kg), SpO2 100 %.Body mass index is 26.95 kg/m.  General Appearance: Casual and Fairly Groomed  Eye Contact:  Fair  Speech:  Clear and Coherent and Normal Rate  Volume:  Normal  Mood:  Anxious, Depressed and Dysphoric  Affect:  Appropriate and Congruent  Thought Process:  Goal Directed and Descriptions of Associations: Intact  Orientation:  Full (Time, Place, and Person)  Thought Content: Rumination   Suicidal Thoughts:  Yes.  without intent/plan  Homicidal Thoughts:  No  Memory:  Immediate;   Fair  Judgement:  Fair  Insight:  Present and Shallow  Psychomotor Activity:  Restlessness  Concentration:  Concentration: Fair  Recall:  [employer] of Knowledge: Fair  Language: Good  Akathisia:  Negative  Handed:  Right  AIMS (if indicated): not done  Assets:  Communication Skills Desire for Improvement Housing Social Support Vocational/Educational  ADL's:  Intact  Cognition: WNL  Sleep:  Poor   Screenings: AUDIT     Admission (Discharged) from [facility]  Alcohol Use Disorder Identification Test Final Score (AUDIT)  0    GAD-7     Counselor from 08/10/2017 in [facility] Counselor from 07/30/2017 in [facility]  Total GAD-7 Score  8  18    PHQ2-9     Counselor from 08/10/2017 in [facility] PRO[facility] Counselor from 08/09/2017 in [facility] Counselor from 07/30/2017 in [facility] PROGRAM  PHQ-2 Total Score  PHQ-9 Total Score  Assessment  and Plan:  SANTIAGO STENZEL is a 22 year old male with borderline personality disorder who continues to struggle with mood lability, and has recently engaged in an episode of cutting.  He continues to complain of difficulty with sleep, and chronic suicidal thoughts.  He works a consistent job at [employer] and is able to drive and transport himself to and from appointments.  He is quite opposed to initiating individual therapy and admits that he tends to avoid participation in therapy.  I spent time educating him about borderline personality disorder and the evidence based treatment modalities.  He is on Prozac 40 mg daily which I feel is an appropriate SSRI for him, but he would benefit from augmentation with Seroquel or other atypical antipsychotic.  I specifically chose Seroquel to target some of his ongoing insomnia symptoms and his complaints of anxiety and panic during the day.  He continues to use hydroxyzine as needed.  It is unclear if he has true ADHD symptoms, or is anxious distress and mood symptoms have contributed to inattentive presentation.  Nonetheless, he reports benefit with Adderall 10 mg twice a day, so we agreed to continue as such.  We will follow-up once more in the coming 8-10 weeks and he will need to establish psychiatric care locally at his [facility] in [place].  I have informed him that he will require therapy and psychiatric care in August when he starts college.  He reports that [facility] does have a mental health program for college students and he will contact them to learn more.  He has chronic suicidal thoughts, but denies any intention or plan, and has good support from his mother, firearms have been hidden/locked away.  1. MDD (major depressive disorder), recurrent episode, moderate (HCC)   2. Severe recurrent major depression without psychotic features (HCC)   3. Other personality and behavioral disorders due to known physiological  condition   4. Borderline personality disorder in adult [facility])     Status of current problems: New to [employer] Ordered: No orders of the defined types were placed in this encounter.   Labs Reviewed: NA  Collateral Obtained/Records Reviewed: Reviewed PHP and prior psychiatric intake assessment  Plan:  Continue Prozac 40 mg daily, continue Adderall 10 mg twice  a day, continue Vistaril 10 mg 3 times a day as needed Discontinue trazodone given lack of benefit Initiate Seroquel 50 mg, increase to 100 mg in 1-2 days as tolerated Return to clinic in 8-10 weeks I have recommended to the patient that he participate in individual therapy, particularly dialectical behavioral therapy, he declines this intervention at this time  I spent 20 minutes with the patient in direct face-to-face clinical care.  Greater than 50% of this time was spent in counseling and coordination of care with the patient.    Burnard Leigh, MD 10/19/2017, 10:50 AM

## 2017-12-18 ENCOUNTER — Ambulatory Visit (HOSPITAL_COMMUNITY): Payer: No Typology Code available for payment source | Admitting: Psychiatry

## 2018-01-12 ENCOUNTER — Other Ambulatory Visit (HOSPITAL_COMMUNITY): Payer: Self-pay

## 2018-01-12 DIAGNOSIS — F0789 Other personality and behavioral disorders due to known physiological condition: Secondary | ICD-10-CM

## 2018-01-12 DIAGNOSIS — F331 Major depressive disorder, recurrent, moderate: Secondary | ICD-10-CM

## 2018-01-12 DIAGNOSIS — F332 Major depressive disorder, recurrent severe without psychotic features: Secondary | ICD-10-CM

## 2018-01-15 ENCOUNTER — Telehealth (HOSPITAL_COMMUNITY): Payer: Self-pay

## 2018-01-15 NOTE — Telephone Encounter (Signed)
Received a refill request from pharmacy for Quetiapine 100mg . Please advise

## 2018-01-16 NOTE — Telephone Encounter (Signed)
Called pharmacy and left voicemail message of below message

## 2018-01-16 NOTE — Telephone Encounter (Signed)
He has not followed up since we started it, He no-showed to the recent follow up.  so no refills until a follow up visit. Thank you!

## 2018-02-13 DIAGNOSIS — H6981 Other specified disorders of Eustachian tube, right ear: Secondary | ICD-10-CM | POA: Diagnosis not present

## 2018-02-13 DIAGNOSIS — H9191 Unspecified hearing loss, right ear: Secondary | ICD-10-CM | POA: Diagnosis not present

## 2018-02-13 DIAGNOSIS — Z23 Encounter for immunization: Secondary | ICD-10-CM | POA: Diagnosis not present

## 2018-03-30 ENCOUNTER — Encounter (HOSPITAL_COMMUNITY): Payer: Self-pay | Admitting: Psychiatry

## 2018-03-30 ENCOUNTER — Ambulatory Visit (INDEPENDENT_AMBULATORY_CARE_PROVIDER_SITE_OTHER): Payer: BLUE CROSS/BLUE SHIELD | Admitting: Psychiatry

## 2018-03-30 DIAGNOSIS — F0789 Other personality and behavioral disorders due to known physiological condition: Secondary | ICD-10-CM

## 2018-03-30 DIAGNOSIS — F331 Major depressive disorder, recurrent, moderate: Secondary | ICD-10-CM | POA: Diagnosis not present

## 2018-03-30 DIAGNOSIS — F411 Generalized anxiety disorder: Secondary | ICD-10-CM

## 2018-03-30 MED ORDER — AMPHETAMINE-DEXTROAMPHETAMINE 10 MG PO TABS: 10.0000 mg | ORAL_TABLET | Freq: Every day | ORAL | 0 refills | Status: DC

## 2018-03-30 MED ORDER — FLUOXETINE HCL 40 MG PO CAPS: 40.0000 mg | ORAL_CAPSULE | Freq: Every day | ORAL | 1 refills | Status: DC

## 2018-03-30 MED ORDER — HYDROXYZINE HCL 10 MG PO TABS: 10.0000 mg | ORAL_TABLET | Freq: Three times a day (TID) | ORAL | 1 refills | Status: DC | PRN

## 2018-03-30 MED ORDER — QUETIAPINE FUMARATE 50 MG PO TABS
ORAL_TABLET | ORAL | 1 refills | Status: DC
Start: 2018-03-30 — End: 2019-07-15

## 2018-03-30 NOTE — Progress Notes (Signed)
[facility] MD/PA/NP OP Progress Note  03/30/2018 3:55 PM Johnny Delgado  MRN:  [id number]  Chief Complaint:  Chief Complaint    Depression; Anxiety; Follow-up; ADHD     HPI: I am meeting the patient for the first time today.  He had an initial intake with Dr. Vanetta Shawl and then a follow-up appointment with Dr. Rene Kocher.  Today he tells me that things are improving.  He has a girlfriend now and he has been spending more time with friends.  He is taking classes at [employer] (due to financial reasons he was not able to go to [facility] as initially planned) and was doing well until he ran out of the Adderall about a month ago.  He states that his depression has improved.  It is always "looming in the background" but most of the time he feels happy.  Once or twice a week he may feel down for an hour or 2 the whole day.  During that time he is irritable with a low frustration tolerance, unmotivated and isolates himself.  He tells me that his anxiety is not as intense as before.  He continues to have racing thoughts but they are not as loud or as overwhelming as before.  And is always thinking about things that he cannot control and worse case scenarios.  He is taking the Vistaril twice a day and that helps.  About once or twice a month he has "panic attacks" where his anxiety ramps up and he feels frozen physically.  It lasted anywhere from 5-15 minutes and after that he calms down.  He reports that the Seroquel is too strong.  For this reason he only takes it on Friday and Saturday nights.  He will take it at 8 PM and reports that he does not wake up until 2 PM the next day.  The sleep is very refreshing but due to the length he is not able to take it on days he has classes.  The nights that he does not take Seroquel he is only sleeping 3-4 hours and feels very tired the next day.  He states that his attention and productivity have decreased since he ran out of the Adderall.  He was making straight A's and now  he is down to a D and several of his classes.  He is unable to focus or complete assignments in a timely fashion.  He feels very unmotivated to do the work.  He is asking to restart the Adderall.  He states he is tolerating the Prozac, Adderall and Vistaril without any issues or side effects.     Visit Diagnosis:    ICD-10-CM   1. Anxiety state F41.1   2. MDD (major depressive disorder), recurrent episode, moderate (HCC) F33.1 amphetamine-dextroamphetamine (ADDERALL) 10 MG tablet    FLUoxetine (PROZAC) 40 MG capsule    QUEtiapine (SEROQUEL) 50 MG tablet  3. 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  Reviewed -no updates at this time   Past Medical History:  Past Medical History:  Diagnosis Date  . ADHD (attention deficit hyperactivity disorder)   . Anxiety   . Depression     Past Surgical History:  Procedure Laterality Date  . WISDOM TOOTH EXTRACTION  Bilateral 2015   All 4 extracted    Family Psychiatric History:   Family History  Problem Relation Age of Onset  . Depression Mother   . Drug abuse Mother   . Depression Father   . Drug abuse Father   . ADD / ADHD Brother   . Drug abuse Paternal Grandmother     Social History:  Social History   Socioeconomic History  . Marital status: Single    Spouse name: Not on file  . Number of children: Not on file  . Years of education: Not on file  . Highest education level: Not on file  Occupational History  . Not on file  Social Needs  . Financial resource strain: Not very hard  . Food insecurity:    Worry: Never true    Inability: Never true  . Transportation needs:    Medical: No    Non-medical: No  Tobacco Use  . Smoking status:  Never Smoker  . Smokeless tobacco: Never Used  Substance and Sexual Activity  . Alcohol use: No    Frequency: Never  . Drug use: No  . Sexual activity: Never  Lifestyle  . Physical activity:    Days per week: 3 days    Minutes per session: 30 min  . Stress: To some extent  Relationships  . Social connections:    Talks on phone: More than three times a week    Gets together: Twice a week    Attends religious service: More than 4 times per year    Active member of club or organization: No    Attends meetings of clubs or organizations: Never    Relationship status: Never married  Other Topics Concern  . Not on file  Social History Narrative  . Not on file    Allergies:  Allergies  Allergen Reactions  . Banana Diarrhea    Metabolic Disorder Labs: Lab Results  Component Value Date   HGBA1C 5.7 (H) 07/21/2017   MPG 116.89 07/21/2017   No results found for: PROLACTIN Lab Results  Component Value Date   CHOL 172 07/21/2017   TRIG 132 07/21/2017   HDL 32 (L) 07/21/2017   CHOLHDL 5.4 07/21/2017   VLDL 26 07/21/2017   LDLCALC 114 (H) 07/21/2017   Lab Results  Component Value Date   TSH 1.162 07/21/2017    Therapeutic Level Labs: No results found for: LITHIUM No results found for: VALPROATE No components found for:  CBMZ  Current Medications: Current Outpatient Medications  Medication Sig Dispense Refill  . amphetamine-dextroamphetamine (ADDERALL) 10 MG tablet Take 1 tablet (10 mg total) by mouth daily with breakfast. 30 tablet 0  . FLUoxetine (PROZAC) 40 MG capsule Take 1 capsule (40 mg total) by mouth daily. For mood control 30 capsule 1  . hydrOXYzine (ATARAX/VISTARIL) 10 MG tablet Take 1 tablet (10 mg total) by mouth 3 (three) times daily as needed for anxiety. 90 tablet 1  . QUEtiapine (SEROQUEL) 50 MG tablet Take 25-50mg  po qHS 30 tablet 1  . amphetamine-dextroamphetamine (ADDERALL) 10 MG tablet Take 1 tablet (10 mg total) by mouth daily. 30 tablet 0   No  current facility-administered medications for this visit.       Musculoskeletal: Strength & Muscle Tone: within normal limits Gait & Station: normal Patient leans: N/A  Psychiatric Specialty Exam: Review of Systems  Constitutional: Negative for chills, diaphoresis and fever.  Respiratory: Negative for cough, sputum production and shortness of breath.     There  were no vitals taken for this visit.There is no height or weight on file to calculate BMI.  General Appearance: Fairly Groomed  Eye Contact:  Good  Speech:  Clear and Coherent and Normal Rate  Volume:  Normal  Mood:  Euthymic  Affect:  Blunt  Thought Process:  Goal Directed and Descriptions of Associations: Intact  Orientation:  Full (Time, Place, and Person)  Thought Content:  Logical  Suicidal Thoughts:  No  Homicidal Thoughts:  No  Memory:  Immediate;   Good  Judgement:  Fair  Insight:  Present  Psychomotor Activity:  Normal  Concentration:  Concentration: Fair  Recall:  Good  Fund of Knowledge:  Good  Language:  Good  Akathisia:  No  Handed:  Right  AIMS (if indicated):     Assets:  Communication Skills Desire for Improvement Social Support Talents/Skills Transportation Vocational/Educational  ADL's:  Intact  Cognition:  WNL  Sleep:        Screenings: AUDIT     Admission (Discharged) from [facility]  Alcohol Use Disorder Identification Test Final Score (AUDIT)  0    GAD-7     Counselor from 08/10/2017 in [facility] Counselor from 07/30/2017 in [facility]  Total GAD-7 Score  8  18    PHQ2-9     Counselor from 08/10/2017 in [facility] Counselor from 08/09/2017 in [facility] Counselor from 07/30/2017 in [facility]  PHQ-2 Total Score  2  2  5   PHQ-9 Total  Score  7  5  21       I reviewed the information below on 03/30/2018 and have updated it Assessment and Plan:  He has chronic suicidal thoughts, but denies any intention or plan.  The last time was about 3 weeks ago.  He states that sometimes when his anxiety is out of control he has passing thoughts of suicide or will dwell on it for a few hours.  He has no history of prior suicide attempts and denies any substance abuse.  He has good social support from his mother, firearms have been hidden/locked away.  1. Anxiety state   2. MDD (major depressive disorder), recurrent episode, moderate (HCC)   3. 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   Labs Reviewed: NA  Collateral Obtained/Records Reviewed: Reviewed previous psych notes  Plan:  Continue Prozac 40 mg daily,  continue Adderall 10 mg twice a day,  continue Vistaril 10 mg 3 times a day as needed  decrease Seroquel 25-50 mg qHS for mood, insomnia- 100mg  was too strong  Return to clinic in 8-10 weeks Patient declined referral for therapy.  He states that is never worked well for him in the past.  I spent 20 minutes with the patient in direct face-to-face clinical care.  Greater than 50% of this time was spent in counseling and coordination of care with the patient.    Oletta Darter, MD 03/30/2018, 3:55 PM

## 2019-04-30 DIAGNOSIS — Z20828 Contact with and (suspected) exposure to other viral communicable diseases: Secondary | ICD-10-CM | POA: Diagnosis not present

## 2019-07-13 ENCOUNTER — Encounter (HOSPITAL_COMMUNITY): Payer: Self-pay | Admitting: Emergency Medicine

## 2019-07-13 ENCOUNTER — Other Ambulatory Visit: Payer: Self-pay

## 2019-07-13 ENCOUNTER — Observation Stay (HOSPITAL_COMMUNITY): Payer: BC Managed Care – PPO

## 2019-07-13 ENCOUNTER — Inpatient Hospital Stay (HOSPITAL_COMMUNITY)
Admission: EM | Admit: 2019-07-13 | Discharge: 2019-07-15 | DRG: 866 | Disposition: A | Discharge status: Home / Self Care | Payer: BC Managed Care – PPO | Attending: Internal Medicine | Admitting: Internal Medicine

## 2019-07-13 ENCOUNTER — Emergency Department (HOSPITAL_COMMUNITY): Payer: BC Managed Care – PPO

## 2019-07-13 DIAGNOSIS — E860 Dehydration: Secondary | ICD-10-CM | POA: Diagnosis not present

## 2019-07-13 DIAGNOSIS — Z91120 Patient's intentional underdosing of medication regimen due to financial hardship: Secondary | ICD-10-CM | POA: Diagnosis not present

## 2019-07-13 DIAGNOSIS — R1084 Generalized abdominal pain: Secondary | ICD-10-CM | POA: Diagnosis not present

## 2019-07-13 DIAGNOSIS — F900 Attention-deficit hyperactivity disorder, predominantly inattentive type: Secondary | ICD-10-CM | POA: Diagnosis present

## 2019-07-13 DIAGNOSIS — Z818 Family history of other mental and behavioral disorders: Secondary | ICD-10-CM

## 2019-07-13 DIAGNOSIS — Z79899 Other long term (current) drug therapy: Secondary | ICD-10-CM | POA: Diagnosis not present

## 2019-07-13 DIAGNOSIS — B349 Viral infection, unspecified: Principal | ICD-10-CM | POA: Diagnosis present

## 2019-07-13 DIAGNOSIS — T43626A Underdosing of amphetamines, initial encounter: Secondary | ICD-10-CM | POA: Diagnosis present

## 2019-07-13 DIAGNOSIS — K529 Noninfective gastroenteritis and colitis, unspecified: Secondary | ICD-10-CM | POA: Diagnosis not present

## 2019-07-13 DIAGNOSIS — R197 Diarrhea, unspecified: Secondary | ICD-10-CM | POA: Diagnosis not present

## 2019-07-13 DIAGNOSIS — E876 Hypokalemia: Secondary | ICD-10-CM | POA: Diagnosis not present

## 2019-07-13 DIAGNOSIS — R0600 Dyspnea, unspecified: Secondary | ICD-10-CM | POA: Diagnosis not present

## 2019-07-13 DIAGNOSIS — F332 Major depressive disorder, recurrent severe without psychotic features: Secondary | ICD-10-CM | POA: Diagnosis present

## 2019-07-13 DIAGNOSIS — R1110 Vomiting, unspecified: Secondary | ICD-10-CM | POA: Diagnosis not present

## 2019-07-13 DIAGNOSIS — Z20822 Contact with and (suspected) exposure to covid-19: Secondary | ICD-10-CM | POA: Diagnosis present

## 2019-07-13 DIAGNOSIS — Z91018 Allergy to other foods: Secondary | ICD-10-CM | POA: Diagnosis not present

## 2019-07-13 DIAGNOSIS — Z813 Family history of other psychoactive substance abuse and dependence: Secondary | ICD-10-CM | POA: Diagnosis not present

## 2019-07-13 DIAGNOSIS — D6959 Other secondary thrombocytopenia: Secondary | ICD-10-CM | POA: Diagnosis present

## 2019-07-13 DIAGNOSIS — Z79890 Hormone replacement therapy (postmenopausal): Secondary | ICD-10-CM | POA: Diagnosis not present

## 2019-07-13 DIAGNOSIS — F419 Anxiety disorder, unspecified: Secondary | ICD-10-CM | POA: Diagnosis not present

## 2019-07-13 DIAGNOSIS — Y929 Unspecified place or not applicable: Secondary | ICD-10-CM

## 2019-07-13 DIAGNOSIS — R112 Nausea with vomiting, unspecified: Secondary | ICD-10-CM | POA: Diagnosis not present

## 2019-07-13 DIAGNOSIS — R6510 Systemic inflammatory response syndrome (SIRS) of non-infectious origin without acute organ dysfunction: Secondary | ICD-10-CM | POA: Diagnosis not present

## 2019-07-13 DIAGNOSIS — R000 Tachycardia, unspecified: Secondary | ICD-10-CM | POA: Diagnosis not present

## 2019-07-13 LAB — CBC WITH DIFFERENTIAL/PLATELET
Abs Immature Granulocytes: 0.03 10*3/uL (ref 0.00–0.07)
Basophils Absolute: 0 10*3/uL (ref 0.0–0.1)
Basophils Relative: 0 %
Eosinophils Absolute: 0 10*3/uL (ref 0.0–0.5)
Eosinophils Relative: 0 %
HCT: 45.6 % (ref 39.0–52.0)
Hemoglobin: 14.9 g/dL (ref 13.0–17.0)
Immature Granulocytes: 0 %
Lymphocytes Relative: 8 %
Lymphs Abs: 0.8 10*3/uL (ref 0.7–4.0)
MCH: 29 pg (ref 26.0–34.0)
MCHC: 32.7 g/dL (ref 30.0–36.0)
MCV: 88.7 fL (ref 80.0–100.0)
Monocytes Absolute: 0.5 10*3/uL (ref 0.1–1.0)
Monocytes Relative: 6 %
Neutro Abs: 7.7 10*3/uL (ref 1.7–7.7)
Neutrophils Relative %: 86 %
Platelets: 103 10*3/uL — ABNORMAL LOW (ref 150–400)
RBC: 5.14 MIL/uL (ref 4.22–5.81)
RDW: 12.1 % (ref 11.5–15.5)
WBC: 9 10*3/uL (ref 4.0–10.5)
nRBC: 0 % (ref 0.0–0.2)

## 2019-07-13 LAB — COMPREHENSIVE METABOLIC PANEL
ALT: 34 U/L (ref 0–44)
AST: 42 U/L — ABNORMAL HIGH (ref 15–41)
Albumin: 5.1 g/dL — ABNORMAL HIGH (ref 3.5–5.0)
Alkaline Phosphatase: 45 U/L (ref 38–126)
Anion gap: 11 (ref 5–15)
BUN: 17 mg/dL (ref 6–20)
CO2: 20 mmol/L — ABNORMAL LOW (ref 22–32)
Calcium: 10.2 mg/dL (ref 8.9–10.3)
Chloride: 108 mmol/L (ref 98–111)
Creatinine, Ser: 0.97 mg/dL (ref 0.61–1.24)
GFR calc Af Amer: >60 mL/min (ref >60)
GFR calc non Af Amer: >60 mL/min (ref >60)
Glucose, Bld: 106 mg/dL — ABNORMAL HIGH (ref 70–99)
Potassium: 4.6 mmol/L (ref 3.5–5.1)
Sodium: 139 mmol/L (ref 135–145)
Total Bilirubin: 1.5 mg/dL — ABNORMAL HIGH (ref 0.3–1.2)
Total Protein: 7.9 g/dL (ref 6.5–8.1)

## 2019-07-13 LAB — CBC
HCT: 51.9 % (ref 39.0–52.0)
Hemoglobin: 18.2 g/dL — ABNORMAL HIGH (ref 13.0–17.0)
MCH: 29.5 pg (ref 26.0–34.0)
MCHC: 35.1 g/dL (ref 30.0–36.0)
MCV: 84.3 fL (ref 80.0–100.0)
Platelets: 151 10*3/uL (ref 150–400)
RBC: 6.16 MIL/uL — ABNORMAL HIGH (ref 4.22–5.81)
RDW: 11.9 % (ref 11.5–15.5)
WBC: 16.8 10*3/uL — ABNORMAL HIGH (ref 4.0–10.5)
nRBC: 0 % (ref 0.0–0.2)

## 2019-07-13 LAB — URINALYSIS, ROUTINE W REFLEX MICROSCOPIC
Bilirubin Urine: NEGATIVE
Glucose, UA: NEGATIVE mg/dL
Hgb urine dipstick: NEGATIVE
Ketones, ur: 20 mg/dL — AB
Leukocytes,Ua: NEGATIVE
Nitrite: NEGATIVE
Protein, ur: NEGATIVE mg/dL
Specific Gravity, Urine: >1.046 — ABNORMAL HIGH (ref 1.005–1.030)
pH: 5 (ref 5.0–8.0)

## 2019-07-13 LAB — RAPID URINE DRUG SCREEN, HOSP PERFORMED
Amphetamines: NOT DETECTED
Barbiturates: NOT DETECTED
Benzodiazepines: NOT DETECTED
Cocaine: NOT DETECTED
Opiates: POSITIVE — AB
Tetrahydrocannabinol: NOT DETECTED

## 2019-07-13 LAB — RESPIRATORY PANEL BY RT PCR (FLU A&B, COVID)
Influenza A by PCR: NEGATIVE
Influenza B by PCR: NEGATIVE
SARS Coronavirus 2 by RT PCR: NEGATIVE

## 2019-07-13 LAB — MAGNESIUM: Magnesium: 1.9 mg/dL (ref 1.7–2.4)

## 2019-07-13 LAB — LACTIC ACID, PLASMA: Lactic Acid, Venous: 1.4 mmol/L (ref 0.5–1.9)

## 2019-07-13 LAB — LIPASE, BLOOD: Lipase: 22 U/L (ref 11–51)

## 2019-07-13 LAB — D-DIMER, QUANTITATIVE: D-Dimer, Quant: 0.4 ug/mL-FEU (ref 0.00–0.50)

## 2019-07-13 LAB — CK: Total CK: 71 U/L (ref 49–397)

## 2019-07-13 MED ORDER — SODIUM CHLORIDE 0.9 % IV SOLN
2.0000 g | Freq: Three times a day (TID) | INTRAVENOUS | Status: DC
  Administered 2019-07-14: 2 g via INTRAVENOUS
  Filled 2019-07-13: qty 2

## 2019-07-13 MED ORDER — ACETAMINOPHEN 500 MG PO TABS
1000.0000 mg | ORAL_TABLET | Freq: Once | ORAL | Status: AC
  Administered 2019-07-13: 1000 mg via ORAL
  Filled 2019-07-13: qty 2

## 2019-07-13 MED ORDER — METRONIDAZOLE IN NACL 5-0.79 MG/ML-% IV SOLN
500.0000 mg | Freq: Three times a day (TID) | INTRAVENOUS | Status: DC
  Administered 2019-07-13 – 2019-07-14 (×2): 500 mg via INTRAVENOUS
  Filled 2019-07-13 (×2): qty 100

## 2019-07-13 MED ORDER — LORAZEPAM 2 MG/ML IJ SOLN
1.0000 mg | Freq: Once | INTRAMUSCULAR | Status: AC
  Administered 2019-07-13: 1 mg via INTRAVENOUS
  Filled 2019-07-13: qty 1

## 2019-07-13 MED ORDER — SODIUM CHLORIDE 0.9 % IV BOLUS
1000.0000 mL | Freq: Once | INTRAVENOUS | Status: AC
  Administered 2019-07-13: 1000 mL via INTRAVENOUS

## 2019-07-13 MED ORDER — VANCOMYCIN HCL 1250 MG/250ML IV SOLN
1250.0000 mg | Freq: Two times a day (BID) | INTRAVENOUS | Status: DC
  Filled 2019-07-13: qty 250

## 2019-07-13 MED ORDER — SODIUM CHLORIDE 0.9 % IV BOLUS
500.0000 mL | Freq: Once | INTRAVENOUS | Status: AC
  Administered 2019-07-13: 500 mL via INTRAVENOUS

## 2019-07-13 MED ORDER — MORPHINE SULFATE (PF) 2 MG/ML IV SOLN
2.0000 mg | Freq: Once | INTRAVENOUS | Status: AC
  Administered 2019-07-13: 2 mg via INTRAVENOUS
  Filled 2019-07-13: qty 1

## 2019-07-13 MED ORDER — SODIUM CHLORIDE 0.9 % IV SOLN
2.0000 g | Freq: Once | INTRAVENOUS | Status: AC
  Administered 2019-07-13: 2 g via INTRAVENOUS
  Filled 2019-07-13: qty 2

## 2019-07-13 MED ORDER — ONDANSETRON HCL 4 MG/2ML IJ SOLN
4.0000 mg | Freq: Once | INTRAMUSCULAR | Status: AC
  Administered 2019-07-13: 4 mg via INTRAVENOUS
  Filled 2019-07-13: qty 2

## 2019-07-13 MED ORDER — IOHEXOL 300 MG/ML  SOLN
100.0000 mL | Freq: Once | INTRAMUSCULAR | Status: AC | PRN
  Administered 2019-07-13: 100 mL via INTRAVENOUS

## 2019-07-13 MED ORDER — MORPHINE SULFATE (PF) 4 MG/ML IV SOLN
4.0000 mg | Freq: Once | INTRAVENOUS | Status: AC
  Administered 2019-07-13: 4 mg via INTRAVENOUS
  Filled 2019-07-13: qty 1

## 2019-07-13 MED ORDER — SODIUM CHLORIDE 0.9% FLUSH
3.0000 mL | Freq: Once | INTRAVENOUS | Status: AC
  Administered 2019-07-13: 3 mL via INTRAVENOUS

## 2019-07-13 MED ORDER — VANCOMYCIN HCL 1500 MG/300ML IV SOLN
1500.0000 mg | Freq: Once | INTRAVENOUS | Status: AC
  Administered 2019-07-14: 1500 mg via INTRAVENOUS
  Filled 2019-07-13: qty 300

## 2019-07-13 NOTE — H&P (Addendum)
Johnny Delgado VCB:[id number] DOB: [birth year] DOA: 07/13/2019   PCP: Johnny Hippo, MD (Inactive)   Outpatient Specialists:  NONE   Patient arrived to ER on 07/13/19 at 68  Patient coming from: home Lives  With roomate    Chief Complaint:   Chief Complaint  Patient presents with  . Emesis  . Tachycardia    HPI: Johnny Delgado is a 24 y.o. male with medical history significant of ADHD, anxiety, depression     Presented with nausea vomiting diarrhea since 5:30 AM when he wake up. He went to work But continued to have nausea and vomiting he has been having intermittent red streaks.  Also reported watery diarrhea 10-11 BM today.  He reports subjective fever. No taste change. reports severe bodyaches and headaches. Try to take Ex-Lax which made his diarrhea worse.  He has been having significant diffuse abdominal pain feeling lightheaded and weak and somewhat short of breath no cough no chest pain no sick contacts he has intermittent headaches and been taking ibuprofen for it. He had similar episode last month but seem to have improved. Patient takes Adderall for his attention deficit disorder as well as Prozac and Seroquel for depression. He work for a [employer]. He wears a mask and tries to social distance. For dinner he had nachos that were left overs.  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 C difficile quick scan w PCR reflex  . Respiratory Panel by RT PCR (Flu A&B, Covid) - Nasopharyngeal Swab  . GI pathogen panel by PCR, stool  . Blood culture (routine x 2)  . SARS CORONAVIRUS 2 (TAT 6-24 HRS) Nasopharyngeal Nasopharyngeal Swab  . Respiratory Panel by PCR  . CT Abdomen Pelvis W Contrast  . DG CHEST PORT 1 VIEW  . Lipase, blood  . Comprehensive metabolic panel  . CBC  . Urinalysis, Routine w reflex microscopic  . Urine rapid drug screen (hosp performed)  . CK  . Magnesium  . TSH  . HIV Antibody (routine testing w rflx)  . C-reactive protein  . D-dimer, quantitative (not at [facility])  . Ferritin  . Fibrinogen  . Lactate dehydrogenase  . Procalcitonin  . Diet clear liquid Room service appropriate? Yes; Fluid consistency: Thin  . Saline Lock IV, Maintain IV access  . Cardiac monitoring  . Fluid/PO Challenge  . Orthostatic vital signs  . Cardiac monitoring  . Novel Coronavirus PPE supplies (droplet and contact precautions) yellow stethoscopes, surgical mask, gowns, surgical caps, face shield, goggles, CAPR - on the floor/unit, cleaning Sani-Cloth (orange and purple top)  . Place COVID-19 isolation sign and PPE checklist outside the DOOR  . Do not give nonsteroidal anti-inflammatory drugs (NSAIDs)  .  Patient to wear surgical mask during transportation  . Place working phone next to the patient  . Initiate Oral Care Protocol  . Initiate Carrier Fluid Protocol  . Consult to hospitalist  ALL PATIENTS BEING ADMITTED/HAVING PROCEDURES NEED COVID-19 SCREENING  . Airborne and Contact precautions  . EKG 12-Lead  . Repeat EKG  . Insert saline lock  . Place in observation (patient's expected length of stay will be less than 2 midnights)    Following Medications were ordered in ER: Medications  sodium chloride 0.9 % bolus 500 mL (has no administration in time range)  sodium chloride flush (NS) 0.9 % injection 3  mL (3 mLs Intravenous Given 07/13/19 1202)  sodium chloride 0.9 % bolus 1,000 mL (0 mLs Intravenous Stopped 07/13/19 1334)  ondansetron (ZOFRAN) injection 4 mg (4 mg Intravenous Given 07/13/19 1213)  morphine 4 MG/ML injection 4 mg (4 mg Intravenous Given 07/13/19 1214)  iohexol (OMNIPAQUE) 300 MG/ML solution 100 mL (100 mLs Intravenous Contrast Given 07/13/19 1250)  sodium chloride 0.9 % bolus 1,000 mL (0 mLs Intravenous Stopped 07/13/19 1543)  sodium chloride 0.9 % bolus 1,000 mL (0 mLs Intravenous Stopped 07/13/19 1837)  LORazepam (ATIVAN) injection 1 mg (1 mg Intravenous Given 07/13/19 1542)        Consult Orders  (From admission, onward)         Start     Ordered   07/13/19 1837  Consult to hospitalist  ALL PATIENTS BEING ADMITTED/HAVING PROCEDURES NEED COVID-19 SCREENING  Once    Comments: ALL PATIENTS BEING ADMITTED/HAVING PROCEDURES NEED COVID-19 SCREENING  Provider:  (Not yet assigned)  Question Answer Comment  Place call to: [facility]   Reason for Consult Admit      07/13/19 1836          Significant initial  Findings: Abnormal Labs Reviewed  COMPREHENSIVE METABOLIC PANEL - Abnormal; Notable for the following components:      Result Value   CO2 20 (*)    Glucose, Bld 106 (*)    Albumin 5.1 (*)    AST 42 (*)    Total Bilirubin 1.5 (*)    All other components within normal limits  CBC - Abnormal; Notable for the following components:   WBC 16.8 (*)    RBC 6.16 (*)    Hemoglobin 18.2 (*)    All other components within normal limits  URINALYSIS, ROUTINE W REFLEX MICROSCOPIC - Abnormal; Notable for the following components:   Specific Gravity, Urine >1.046 (*)    Ketones, ur 20 (*)    All other components within normal limits  RAPID URINE DRUG SCREEN, HOSP PERFORMED - Abnormal; Notable for the following components:   Opiates POSITIVE (*)    All other components within normal limits    Otherwise labs showing:    Recent Labs  Lab 07/13/19 1034  NA 139  K 4.6    CO2 20*  GLUCOSE 106*  BUN 17  CREATININE 0.97  CALCIUM 10.2    Cr stable,    Lab Results  Component Value Date   CREATININE 0.97 07/13/2019   CREATININE 0.92 09/28/2017   CREATININE 0.90 07/21/2017    Recent Labs  Lab 07/13/19 1034  AST 42*  ALT 34  ALKPHOS 45  BILITOT 1.5*  PROT 7.9  ALBUMIN 5.1*   Lab Results  Component Value Date   CALCIUM 10.2 07/13/2019     WBC      Component Value Date/Time   WBC 16.8 (H) 07/13/2019 1034  ANC    Component Value Date/Time   NEUTROABS 3.5 09/28/2017 1232   ALC No components found for: LYMPHAB    Plt: Lab Results  Component Value Date   PLT 151 07/13/2019    Lactic Acid, Venous    Component Value Date/Time   LATICACIDVEN 1.4 07/13/2019 1941    Procalcitonin  Ordered   COVID-19 Labs  No results for input(s): DDIMER, FERRITIN, LDH, CRP in the last 72 hours.  Lab Results  Component Value Date   [place] NEGATIVE 07/13/2019     HG/HCT  Up from baseline see below    Component Value Date/Time   HGB 18.2 (H) 07/13/2019 1034   HCT 51.9 07/13/2019 1034    Recent Labs  Lab 07/13/19 1034  LIPASE 22   No results for input(s): AMMONIA in the last 168 hours.  No components found for: LABALBU    ECG: Ordered Personally reviewed by me showing: HR : 124 Rhythm:   Sinus tachycardia     no evidence of ischemic changes QTC 443     UA   no evidence of UTI    Urine analysis:    Component Value Date/Time   COLORURINE YELLOW 07/13/2019 1425   APPEARANCEUR CLEAR 07/13/2019 1425   LABSPEC >1.046 (H) 07/13/2019 1425   PHURINE 5.0 07/13/2019 1425   GLUCOSEU NEGATIVE 07/13/2019 1425   HGBUR NEGATIVE 07/13/2019 1425   BILIRUBINUR NEGATIVE 07/13/2019 1425   KETONESUR 20 (A) 07/13/2019 1425   PROTEINUR NEGATIVE 07/13/2019 1425   NITRITE NEGATIVE 07/13/2019 1425   LEUKOCYTESUR NEGATIVE 07/13/2019 1425       Ordered   CTabd/pelvis -  nonacute     ED Triage Vitals  Enc Vitals Group     BP 07/13/19  1023 126/86     Pulse Rate 07/13/19 1023 (!) 168     Resp 07/13/19 1023 18     Temp 07/13/19 1023 99.5 F (37.5 C)     Temp Source 07/13/19 1023 Oral     SpO2 07/13/19 1023 95 %     Weight --      Height --      Head Circumference --      Peak Flow --      Pain Score 07/13/19 1214 5     Pain Loc --      Pain Edu? --      Excl. in [place]? --   TMAX(24)@       Latest  Blood pressure 96/78, pulse (!) 134, temperature 98.9 F (37.2 C), temperature source Oral, resp. rate 20, SpO2 98 %.     Hospitalist was called for admission for dehydration in a setting of gastroenteritis   Review of Systems:    Pertinent positives include:   abdominal pain, nausea, vomiting, diarrhea  Constitutional:  No weight loss, night sweats, Fevers, chills, fatigue, weight loss  HEENT:  No headaches, Difficulty swallowing,Tooth/dental problems,Sore throat,  No sneezing, itching, ear ache, nasal congestion, post nasal drip,  Cardio-vascular:  No chest pain, Orthopnea, PND, anasarca, dizziness, palpitations.no Bilateral lower extremity swelling  GI:  No heartburn, indigestion,, change in bowel habits, loss of appetite, melena, blood in stool, hematemesis Resp:  no shortness of breath at rest. No dyspnea on exertion, No excess mucus, no productive cough, No non-productive cough, No coughing up of blood.No change in color of mucus.No wheezing. Skin:  no rash or lesions. No jaundice GU:  no dysuria, change in color of urine, no urgency or frequency. No straining to urinate.  No flank pain.  Musculoskeletal:  No joint pain or no joint swelling. No decreased range of motion. No back pain.  Psych:  No change in mood or affect. No depression or anxiety. No memory loss.  Neuro: no localizing neurological complaints, no tingling, no weakness, no double vision, no gait abnormality, no slurred speech, no confusion  All systems reviewed and apart from [place] all are negative  Past Medical History:   Past Medical  History:  Diagnosis Date  . ADHD (attention deficit hyperactivity disorder)   . Anxiety   . Depression      Past Surgical History:  Procedure Laterality Date  . WISDOM TOOTH EXTRACTION Bilateral 2015   All 4 extracted    Social History:  Ambulatory   Independently      reports that he has never smoked. He has never used smokeless tobacco. He reports that he does not drink alcohol or use drugs.      Family History:   Family History  Problem Relation Age of Onset  . Depression Mother   . Drug abuse Mother   . Depression Father   . Drug abuse Father   . ADD / ADHD Brother   . Drug abuse Paternal Grandmother     Allergies: Allergies  Allergen Reactions  . Banana Diarrhea     Prior to Admission medications   Medication Sig Start Date End Date Taking? Authorizing Provider  MELATONIN PO Take 1 tablet by mouth at bedtime.   Yes [provider]  amphetamine-dextroamphetamine (ADDERALL) 10 MG tablet Take 1 tablet (10 mg total) by mouth daily with breakfast. Patient not taking: Reported on 07/13/2019 03/30/18   Charlcie Cradle, MD  amphetamine-dextroamphetamine (ADDERALL) 10 MG tablet Take 1 tablet (10 mg total) by mouth daily. Patient not taking: Reported on 07/13/2019 03/30/18 03/30/19  Charlcie Cradle, MD  FLUoxetine (PROZAC) 40 MG capsule Take 1 capsule (40 mg total) by mouth daily. For mood control Patient not taking: Reported on 07/13/2019 03/30/18   Charlcie Cradle, MD  hydrOXYzine (ATARAX/VISTARIL) 10 MG tablet Take 1 tablet (10 mg total) by mouth 3 (three) times daily as needed for anxiety. Patient not taking: Reported on 07/13/2019 03/30/18   Charlcie Cradle, MD  QUEtiapine (SEROQUEL) 50 MG tablet Take 25-70m po qHS Patient not taking: Reported on 07/13/2019 03/30/18   ACharlcie Cradle MD   Physical Exam: Blood pressure 96/78, pulse (!) 134, temperature 98.9 F (37.2 C), temperature source Oral, resp. rate 20, SpO2 98 %. 1. General:  in No  Acute distress     acutely ill -appearing 2. Psychological: Alert and  Oriented 3. Head/ENT:  Dry Mucous Membranes                          Head Non traumatic, neck supple                           Poor Dentition 4. SKIN:  decreased Skin turgor,  Skin clean Dry and intact no rash 5. Heart: Regular rate and rhythm no  Murmur, no Rub or gallop 6. Lungs: Clear to auscultation bilaterally, no wheezes or crackles   7. Abdomen: Soft,  non-tender,  obese  bowel sounds present 8. Lower extremities: no clubbing, cyanosis, no edema 9. Neurologically Grossly intact, moving all 4 extremities equally   10. MSK: Normal range of motion   All other LABS:     Recent Labs  Lab 07/13/19 1034  WBC 16.8*  HGB 18.2*  HCT 51.9  MCV 84.3  PLT 151     Recent Labs  Lab 07/13/19 1034  NA 139  K 4.6  CL 108  CO2 20*  GLUCOSE 106*  BUN 17  CREATININE 0.97  CALCIUM 10.2     Recent Labs  Lab 07/13/19 1034  AST 42*  ALT 34  ALKPHOS 45  BILITOT 1.5*  PROT 7.9  ALBUMIN 5.1*      Cultures: No results found for: SDES, SPECREQUEST, CULT, REPTSTATUS   Radiological Exams on Admission: CT Abdomen Pelvis W Contrast  Result Date: 07/13/2019 CLINICAL DATA:  Vomiting and diarrhea. EXAM: CT ABDOMEN AND PELVIS WITH CONTRAST TECHNIQUE: Multidetector CT imaging of the abdomen and pelvis was performed using the standard protocol following bolus administration of intravenous contrast. CONTRAST:  180m OMNIPAQUE IOHEXOL 300 MG/ML  SOLN COMPARISON:  No comparison studies available. FINDINGS: Lower chest: Unremarkable. Hepatobiliary: No suspicious focal abnormality within the liver parenchyma. There is no evidence for gallstones, gallbladder wall thickening, or pericholecystic fluid. No intrahepatic or extrahepatic biliary dilation. Pancreas: No focal mass lesion. No dilatation of the main duct. No intraparenchymal cyst. No peripancreatic edema. Spleen: No splenomegaly. No focal mass lesion. Adrenals/Urinary Tract: No adrenal nodule  or mass. Kidneys unremarkable. No evidence for hydroureter. The urinary bladder appears normal for the degree of distention. Stomach/Bowel: Stomach is unremarkable. No gastric wall thickening. No evidence of outlet obstruction. Duodenum is normally positioned as is the ligament of Treitz. No small bowel wall thickening. No small bowel dilatation. The terminal ileum is normal. The appendix is normal. No gross colonic mass. No colonic wall thickening. Fluid is visible in the rectum compatible with the reported clinical history of diarrhea. There is no edema or inflammation around small bowel or colon. Vascular/Lymphatic: No abdominal aortic aneurysm. No abdominal aortic atherosclerotic calcification. Portal vein and superior mesenteric vein are patent. Celiac axis, SMA, and IMA are widely patent. There is no gastrohepatic or hepatoduodenal ligament lymphadenopathy. No retroperitoneal or mesenteric lymphadenopathy. No pelvic sidewall lymphadenopathy. Reproductive: The prostate gland and seminal vesicles are unremarkable. Other: No intraperitoneal free fluid. Musculoskeletal: No worrisome lytic or sclerotic osseous abnormality. IMPRESSION: No acute findings in the abdomen or pelvis. Specifically, no evidence to explain the patient's history of vomiting and diarrhea. Electronically Signed   By: EMisty StanleyM.D.   On: 07/13/2019 13:08    Chart has been reviewed    Assessment/Plan   24y.o. male with medical history significant of ADHD, anxiety, depression   Admitted for SIRS dehydration in a setting of gastroenteritis  Present on Admission: SIRS -  -SIRS criteria met with  elevated white blood cell count,   tachycardia ,  fever.    without evidence of end organ damage  -Most likely source being:  viral ,  COVID negative but will repeat Given toxicity and negative COVid will initiate abx and obtain blood cult  - Obtain serial lactic acid and procalcitonin level.  - Initiate IV antibiotics   - await  results of blood and urine culture  - Rehydrate aggressively  . Dehydration -rehydrate and follow fluid status  . ADHD (attention deficit hyperactivity disorder), inattentive type -hold off on home medications secondary to significant tachycardia  . Severe recurrent major depression without psychotic features ([place] -patient to restart home medications once stable  . Gastroenteritis -evaluate for Covid will ned to repeat in AM, obtain gastric panel, c.diff pending CT scan unremarkable patient reported one episode of possible blood in  vomitus.  We will continue to follow and monitor have not recurred since.   LOW TSH - will check T4 T3 levels  Other plan as per orders.  DVT prophylaxis:  SCD  Code Status:  FULL CODE  as per patient  I had personally discussed CODE STATUS with patient   Family Communication:   Family not at  Bedside spoke to Father on  A phone    Disposition Plan:   To home once workup is complete and patient is stable    Consults called: none  Admission status:  ED Disposition    ED Disposition Condition Comment   Admit  The patient appears reasonably stabilized for admission considering the current resources, flow, and capabilities available in the ED at this time, and I doubt any other [facility] requiring further screening and/or treatment in the ED prior to admission is  present.       Obs      Level of care    tele    24H   please discontinue once patient no longer qualifies   Precautions: admitted as  PUI   Enteric precautions (UV disinfection) initial Covid PCR is negative  -  would need additional investigation given very high risk for false native test result   PPE: Used by the provider:   P100  eye Goggles,  Gloves  gown    Jeb Schloemer 07/13/2019, 10:19 PM    [facility]     after 2 AM please page floor coverage PA If 7AM-7PM, please contact the day team taking care of the patient using [email or web address]   Patient was evaluated in  the context of the global COVID-19 pandemic, which necessitated consideration that the patient might be at risk for infection with the SARS-CoV-2 virus that causes COVID-19. Institutional protocols and algorithms that pertain to the evaluation of patients at risk for COVID-19 are in a state of rapid change based on information released by regulatory bodies including the CDC and federal and state organizations. These policies and algorithms were followed during the patient's care.

## 2019-07-13 NOTE — ED Provider Notes (Signed)
[facility] EMERGENCY DEPARTMENT Provider Note   CSN: [id number] Arrival date & time: 07/13/19  1019     History Chief Complaint  Patient presents with  . Emesis  . Tachycardia    Johnny Delgado is a 24 y.o. male.  23yo male with past medical history of ADHD, anxiety, depression presents with complaint of nausea and vomiting since 530AM today upon waking. Patient went to work, continued to vomit and so he went home. Patient reports emesis looks dark, possibly bloody. Also reports diarrhea (water/yellow in nature, non bloody), did take exlax which made his diarrhea worse. Reports diffuse abdominal pain, intermittent. Associated with feeling, dizzy, weak, SHOB. Denies cough, chest pain, sick contacts. Patient felt well yesterday. Take IBU 1-2 times weekly for headaches. Reports similar episode last month, not this bad, resolved without intervention. Last drank alcohol a few weeks ago, has never smoke marijuana and denies other drug use. No other complaints or concerns.         Past Medical History:  Diagnosis Date  . ADHD (attention deficit hyperactivity disorder)   . Anxiety   . Depression     Patient Active Problem List   Diagnosis Date Noted  . ADHD (attention deficit hyperactivity disorder), inattentive type 07/30/2017  . Severe recurrent major depression without psychotic features (HCC) 07/20/2017    Past Surgical History:  Procedure Laterality Date  . WISDOM TOOTH EXTRACTION Bilateral 2015   All 4 extracted       Family History  Problem Relation Age of Onset  . Depression Mother   . Drug abuse Mother   . Depression Father   . Drug abuse Father   . ADD / ADHD Brother   . Drug abuse Paternal Grandmother     Social History   Tobacco Use  . Smoking status: Never Smoker  . Smokeless tobacco: Never Used  Substance Use Topics  . Alcohol use: No  . Drug use: No    Home Medications Prior to Admission medications   Medication Sig Start  Date End Date Taking? Authorizing Provider  MELATONIN PO Take 1 tablet by mouth at bedtime.   Yes [provider]  amphetamine-dextroamphetamine (ADDERALL) 10 MG tablet Take 1 tablet (10 mg total) by mouth daily with breakfast. Patient not taking: Reported on 07/13/2019 03/30/18   Oletta Darter, MD  amphetamine-dextroamphetamine (ADDERALL) 10 MG tablet Take 1 tablet (10 mg total) by mouth daily. Patient not taking: Reported on 07/13/2019 03/30/18 03/30/19  Oletta Darter, MD  FLUoxetine (PROZAC) 40 MG capsule Take 1 capsule (40 mg total) by mouth daily. For mood control Patient not taking: Reported on 07/13/2019 03/30/18   Oletta Darter, MD  hydrOXYzine (ATARAX/VISTARIL) 10 MG tablet Take 1 tablet (10 mg total) by mouth 3 (three) times daily as needed for anxiety. Patient not taking: Reported on 07/13/2019 03/30/18   Oletta Darter, MD  QUEtiapine (SEROQUEL) 50 MG tablet Take 25-50mg  po qHS Patient not taking: Reported on 07/13/2019 03/30/18   Oletta Darter, MD    Allergies    Banana  Review of Systems   Review of Systems  Constitutional: Negative for chills, diaphoresis and fever.  Respiratory: Positive for shortness of breath.   Gastrointestinal: Positive for abdominal pain, diarrhea, nausea and vomiting. Negative for blood in stool and constipation.  Genitourinary: Negative for difficulty urinating.  Musculoskeletal: Negative for arthralgias and myalgias.  Skin: Negative for rash and wound.  Allergic/Immunologic: Negative for immunocompromised state.  Neurological: Positive for light-headedness. Negative for weakness.  Hematological:  Negative for adenopathy.  Psychiatric/Behavioral: Negative for confusion.  All other systems reviewed and are negative.   Physical Exam Updated Vital Signs BP 100/66   Pulse (!) 125   Temp 98.9 F (37.2 C) (Oral)   Resp 20   SpO2 98%   Physical Exam Vitals and nursing note reviewed.  Constitutional:      General: He is not in acute  distress.    Appearance: He is well-developed. He is not diaphoretic.  HENT:     Head: Normocephalic and atraumatic.     Mouth/Throat:     Mouth: Mucous membranes are moist.  Cardiovascular:     Rate and Rhythm: Regular rhythm. Tachycardia present.     Heart sounds: Normal heart sounds.  Pulmonary:     Effort: Pulmonary effort is normal.     Breath sounds: Normal breath sounds.  Abdominal:     Palpations: Abdomen is soft.     Tenderness: There is abdominal tenderness in the left upper quadrant and left lower quadrant. There is no right CVA tenderness, left CVA tenderness or guarding. Negative signs include Ayra Hodgdon's sign.  Musculoskeletal:     Right lower leg: No edema.     Left lower leg: No edema.  Skin:    General: Skin is warm and dry.     Findings: No erythema or rash.  Neurological:     Mental Status: He is alert and oriented to person, place, and time.  Psychiatric:        Behavior: Behavior normal.     ED Results / Procedures / Treatments   Labs (all labs ordered are listed, but only abnormal results are displayed) Labs Reviewed  COMPREHENSIVE METABOLIC PANEL - Abnormal; Notable for the following components:      Result Value   CO2 20 (*)    Glucose, Bld 106 (*)    Albumin 5.1 (*)    AST 42 (*)    Total Bilirubin 1.5 (*)    All other components within normal limits  CBC - Abnormal; Notable for the following components:   WBC 16.8 (*)    RBC 6.16 (*)    Hemoglobin 18.2 (*)    All other components within normal limits  URINALYSIS, ROUTINE W REFLEX MICROSCOPIC - Abnormal; Notable for the following components:   Specific Gravity, Urine >1.046 (*)    Ketones, ur 20 (*)    All other components within normal limits  RAPID URINE DRUG SCREEN, HOSP PERFORMED - Abnormal; Notable for the following components:   Opiates POSITIVE (*)    All other components within normal limits  C DIFFICILE QUICK SCREEN W PCR REFLEX  SARS CORONAVIRUS 2 (TAT 6-24 HRS)  RESPIRATORY  PANEL BY RT PCR (FLU A&B, COVID)  LIPASE, BLOOD  LACTIC ACID, PLASMA  LACTIC ACID, PLASMA  CK  MAGNESIUM    EKG EKG Interpretation  Date/Time:  Sunday July 13 2019 14:25:20 EST Ventricular Rate:  126 PR Interval:    QRS Duration: 94 QT Interval:  312 QTC Calculation: 452 R Axis:   68 Text Interpretation: Sinus tachycardia Probable inferior infarct, old Confirmed by Lacretia Leigh ([id number]) on 07/13/2019 2:54:36 PM   Radiology CT Abdomen Pelvis W Contrast  Result Date: 07/13/2019 CLINICAL DATA:  Vomiting and diarrhea. EXAM: CT ABDOMEN AND PELVIS WITH CONTRAST TECHNIQUE: Multidetector CT imaging of the abdomen and pelvis was performed using the standard protocol following bolus administration of intravenous contrast. CONTRAST:  134mL OMNIPAQUE IOHEXOL 300 MG/ML  SOLN COMPARISON:  No comparison studies  available. FINDINGS: Lower chest: Unremarkable. Hepatobiliary: No suspicious focal abnormality within the liver parenchyma. There is no evidence for gallstones, gallbladder wall thickening, or pericholecystic fluid. No intrahepatic or extrahepatic biliary dilation. Pancreas: No focal mass lesion. No dilatation of the main duct. No intraparenchymal cyst. No peripancreatic edema. Spleen: No splenomegaly. No focal mass lesion. Adrenals/Urinary Tract: No adrenal nodule or mass. Kidneys unremarkable. No evidence for hydroureter. The urinary bladder appears normal for the degree of distention. Stomach/Bowel: Stomach is unremarkable. No gastric wall thickening. No evidence of outlet obstruction. Duodenum is normally positioned as is the ligament of Treitz. No small bowel wall thickening. No small bowel dilatation. The terminal ileum is normal. The appendix is normal. No gross colonic mass. No colonic wall thickening. Fluid is visible in the rectum compatible with the reported clinical history of diarrhea. There is no edema or inflammation around small bowel or colon. Vascular/Lymphatic: No abdominal  aortic aneurysm. No abdominal aortic atherosclerotic calcification. Portal vein and superior mesenteric vein are patent. Celiac axis, SMA, and IMA are widely patent. There is no gastrohepatic or hepatoduodenal ligament lymphadenopathy. No retroperitoneal or mesenteric lymphadenopathy. No pelvic sidewall lymphadenopathy. Reproductive: The prostate gland and seminal vesicles are unremarkable. Other: No intraperitoneal free fluid. Musculoskeletal: No worrisome lytic or sclerotic osseous abnormality. IMPRESSION: No acute findings in the abdomen or pelvis. Specifically, no evidence to explain the patient's history of vomiting and diarrhea. Electronically Signed   By: Kennith Center M.D.   On: 07/13/2019 13:08    Procedures Procedures (including critical care time)  Medications Ordered in ED Medications  sodium chloride 0.9 % bolus 500 mL (has no administration in time range)  sodium chloride flush (NS) 0.9 % injection 3 mL (3 mLs Intravenous Given 07/13/19 1202)  sodium chloride 0.9 % bolus 1,000 mL (0 mLs Intravenous Stopped 07/13/19 1334)  ondansetron (ZOFRAN) injection 4 mg (4 mg Intravenous Given 07/13/19 1213)  morphine 4 MG/ML injection 4 mg (4 mg Intravenous Given 07/13/19 1214)  iohexol (OMNIPAQUE) 300 MG/ML solution 100 mL (100 mLs Intravenous Contrast Given 07/13/19 1250)  sodium chloride 0.9 % bolus 1,000 mL (0 mLs Intravenous Stopped 07/13/19 1543)  sodium chloride 0.9 % bolus 1,000 mL (0 mLs Intravenous Stopped 07/13/19 1837)  LORazepam (ATIVAN) injection 1 mg (1 mg Intravenous Given 07/13/19 1542)    ED Course  I have reviewed the triage vital signs and the nursing notes.  Pertinent labs & imaging results that were available during my care of the patient were reviewed by me and considered in my medical decision making (see chart for details).  Clinical Course as of Jul 12 1898  Wynelle Link Jul 13, 2019  [id number] 24 year old male presents with complaint of nausea, vomiting, diarrhea and abdominal pain onset  this morning, persistent so he presented to the emergency room.  Patient states he did have 1 episode of emesis that appeared to be dark red, unsure if this was blood, after that had bilious episodes, no further episodes of red emesis.  Denies bloody stools.  Mild tenderness in left upper and lower abdomen.  Patient is tachycardic on exam.  Patient was given 3 L of IV fluids, remains tachycardic.  Orthostatic vitals show patient's heart rate goes from 120 lying to 140 standing, blood pressure unchanged.  UDS positive for opiates, given in the ER otherwise negative.  CMP with bicarb of 20, AST mildly increased at 42 and total bili 1.5.  CBC with leukocytosis of 16.8.  Urinalysis with ketones.  CT abdomen and pelvis without  significant findings or explanation for patient's symptoms today.  He was discussed with Dr. Freida Busman, ED attending, recommends consult for admission for tachycardia. Patient continues to have numerous episodes of diarrhea while in the ER, will order C. difficile in addition to his Covid screening for admission.   [LM]  1839 Case discussed with Dr. Adela Glimpse with [facility] hospitalist service, additional fluids ordered as well as magnesium, CK, lactic acid.  Dr. Adela Glimpse will consult for admission.   [LM]    Clinical Course User Index [LM] Alden Hipp   MDM Rules/Calculators/A&P                      Final Clinical Impression(s) / ED Diagnoses Final diagnoses:  Tachycardia  Nausea vomiting and diarrhea  Generalized abdominal pain    Rx / DC Orders ED Discharge Orders    None       Jeannie Fend, PA-C 07/13/19 1900    Lorre Nick, MD 07/15/19 [phone]

## 2019-07-13 NOTE — ED Triage Notes (Signed)
Pt reports he has been throwing up all morning, about 30 mins prior to arrival he reports some dark blood in his emesis. Also having diarrhea. Pt endorses that he feels like he has had a fever at home. Denies any recent sick contacts.

## 2019-07-13 NOTE — Progress Notes (Signed)
Pharmacy Antibiotic Note  Johnny Delgado is a 24 y.o. male admitted on 07/13/2019 with sepsis.  Pharmacy has been consulted for Vancomycin and Cefepime dosing. Pt also on Flagyl.  Plan: Cefepime 2gm IV q8h Vancomycin 1500 mg IV now then 1250mg  IV Q 12 hrs. Goal AUC 400-550. Expected AUC: 475 SCr used: 0.97 Will f/u renal function, micro data, and pt's clinical condition Vanc levels prn      Temp (24hrs), Avg:99.8 F (37.7 C), Min:98.6 F (37 C), Max:102 F (38.9 C)  Recent Labs  Lab 07/13/19 1034 07/13/19 1941  WBC 16.8*  --   CREATININE 0.97  --   LATICACIDVEN  --  1.4    CrCl cannot be calculated (Unknown ideal weight.).    Allergies  Allergen Reactions  . Banana Diarrhea    Antimicrobials this admission: 2/7 Cefepime >>  2/7 Vanc >>  2/7 Flagyl >>  Microbiology results:  BCx:  2/7 SARS coronavirus 2: negative  Thank you for allowing pharmacy to be a part of this patient's care.  4/7, PharmD, BCPS Please see amion for complete clinical pharmacist phone list 07/13/2019 10:13 PM

## 2019-07-13 NOTE — ED Notes (Signed)
Please call mom Quaran Kedzierski  [phone] for a status update--Candace Ramus

## 2019-07-14 DIAGNOSIS — R197 Diarrhea, unspecified: Secondary | ICD-10-CM | POA: Diagnosis not present

## 2019-07-14 DIAGNOSIS — F900 Attention-deficit hyperactivity disorder, predominantly inattentive type: Secondary | ICD-10-CM | POA: Diagnosis present

## 2019-07-14 DIAGNOSIS — F332 Major depressive disorder, recurrent severe without psychotic features: Secondary | ICD-10-CM | POA: Diagnosis present

## 2019-07-14 DIAGNOSIS — Z91120 Patient's intentional underdosing of medication regimen due to financial hardship: Secondary | ICD-10-CM | POA: Diagnosis not present

## 2019-07-14 DIAGNOSIS — R112 Nausea with vomiting, unspecified: Secondary | ICD-10-CM | POA: Diagnosis not present

## 2019-07-14 DIAGNOSIS — Z91018 Allergy to other foods: Secondary | ICD-10-CM | POA: Diagnosis not present

## 2019-07-14 DIAGNOSIS — T43626A Underdosing of amphetamines, initial encounter: Secondary | ICD-10-CM | POA: Diagnosis present

## 2019-07-14 DIAGNOSIS — K529 Noninfective gastroenteritis and colitis, unspecified: Secondary | ICD-10-CM

## 2019-07-14 DIAGNOSIS — Z20822 Contact with and (suspected) exposure to covid-19: Secondary | ICD-10-CM | POA: Diagnosis present

## 2019-07-14 DIAGNOSIS — Y929 Unspecified place or not applicable: Secondary | ICD-10-CM | POA: Diagnosis not present

## 2019-07-14 DIAGNOSIS — Z818 Family history of other mental and behavioral disorders: Secondary | ICD-10-CM | POA: Diagnosis not present

## 2019-07-14 DIAGNOSIS — D6959 Other secondary thrombocytopenia: Secondary | ICD-10-CM | POA: Diagnosis present

## 2019-07-14 DIAGNOSIS — Z79899 Other long term (current) drug therapy: Secondary | ICD-10-CM | POA: Diagnosis not present

## 2019-07-14 DIAGNOSIS — Z79890 Hormone replacement therapy (postmenopausal): Secondary | ICD-10-CM | POA: Diagnosis not present

## 2019-07-14 DIAGNOSIS — Z813 Family history of other psychoactive substance abuse and dependence: Secondary | ICD-10-CM | POA: Diagnosis not present

## 2019-07-14 DIAGNOSIS — F419 Anxiety disorder, unspecified: Secondary | ICD-10-CM | POA: Diagnosis present

## 2019-07-14 DIAGNOSIS — E876 Hypokalemia: Secondary | ICD-10-CM | POA: Diagnosis not present

## 2019-07-14 DIAGNOSIS — R000 Tachycardia, unspecified: Secondary | ICD-10-CM | POA: Diagnosis present

## 2019-07-14 DIAGNOSIS — B349 Viral infection, unspecified: Secondary | ICD-10-CM | POA: Diagnosis present

## 2019-07-14 DIAGNOSIS — E860 Dehydration: Secondary | ICD-10-CM | POA: Diagnosis present

## 2019-07-14 LAB — COMPREHENSIVE METABOLIC PANEL
ALT: 22 U/L (ref 0–44)
ALT: 22 U/L (ref 0–44)
AST: 22 U/L (ref 15–41)
AST: 20 U/L (ref 15–41)
Albumin: 3.7 g/dL (ref 3.5–5.0)
Albumin: 3.4 g/dL — ABNORMAL LOW (ref 3.5–5.0)
Alkaline Phosphatase: 28 U/L — ABNORMAL LOW (ref 38–126)
Alkaline Phosphatase: 27 U/L — ABNORMAL LOW (ref 38–126)
Anion gap: 15 (ref 5–15)
Anion gap: 9 (ref 5–15)
BUN: 11 mg/dL (ref 6–20)
BUN: 9 mg/dL (ref 6–20)
CO2: 14 mmol/L — ABNORMAL LOW (ref 22–32)
CO2: 19 mmol/L — ABNORMAL LOW (ref 22–32)
Calcium: 8.3 mg/dL — ABNORMAL LOW (ref 8.9–10.3)
Calcium: 7.8 mg/dL — ABNORMAL LOW (ref 8.9–10.3)
Chloride: 110 mmol/L (ref 98–111)
Chloride: 110 mmol/L (ref 98–111)
Creatinine, Ser: 0.75 mg/dL (ref 0.61–1.24)
Creatinine, Ser: 0.83 mg/dL (ref 0.61–1.24)
GFR calc Af Amer: >60 mL/min (ref >60)
GFR calc Af Amer: >60 mL/min (ref >60)
GFR calc non Af Amer: >60 mL/min (ref >60)
GFR calc non Af Amer: >60 mL/min (ref >60)
Glucose, Bld: 112 mg/dL — ABNORMAL HIGH (ref 70–99)
Glucose, Bld: 111 mg/dL — ABNORMAL HIGH (ref 70–99)
Potassium: 3.8 mmol/L (ref 3.5–5.1)
Potassium: 3.4 mmol/L — ABNORMAL LOW (ref 3.5–5.1)
Sodium: 139 mmol/L (ref 135–145)
Sodium: 138 mmol/L (ref 135–145)
Total Bilirubin: 0.8 mg/dL (ref 0.3–1.2)
Total Bilirubin: 1.2 mg/dL (ref 0.3–1.2)
Total Protein: 6.4 g/dL — ABNORMAL LOW (ref 6.5–8.1)
Total Protein: 5.7 g/dL — ABNORMAL LOW (ref 6.5–8.1)

## 2019-07-14 LAB — CBC
HCT: 43.2 % (ref 39.0–52.0)
Hemoglobin: 14.3 g/dL (ref 13.0–17.0)
MCH: 29.1 pg (ref 26.0–34.0)
MCHC: 33.1 g/dL (ref 30.0–36.0)
MCV: 88 fL (ref 80.0–100.0)
Platelets: 83 10*3/uL — ABNORMAL LOW (ref 150–400)
RBC: 4.91 MIL/uL (ref 4.22–5.81)
RDW: 12 % (ref 11.5–15.5)
WBC: 7.5 10*3/uL (ref 4.0–10.5)
nRBC: 0 % (ref 0.0–0.2)

## 2019-07-14 LAB — RESPIRATORY PANEL BY PCR

## 2019-07-14 LAB — C DIFFICILE QUICK SCREEN W PCR REFLEX
C Diff antigen: NEGATIVE
C Diff interpretation: NOT DETECTED
C Diff toxin: NEGATIVE

## 2019-07-14 LAB — LACTATE DEHYDROGENASE: LDH: 197 U/L — ABNORMAL HIGH (ref 98–192)

## 2019-07-14 LAB — HIV ANTIBODY (ROUTINE TESTING W REFLEX): HIV Screen 4th Generation wRfx: NONREACTIVE

## 2019-07-14 LAB — PROCALCITONIN: Procalcitonin: 0.4 ng/mL

## 2019-07-14 LAB — T4, FREE: Free T4: 0.84 ng/dL (ref 0.61–1.12)

## 2019-07-14 LAB — FERRITIN: Ferritin: 84 ng/mL (ref 24–336)

## 2019-07-14 LAB — SARS CORONAVIRUS 2 (TAT 6-24 HRS): SARS Coronavirus 2: NEGATIVE

## 2019-07-14 LAB — LACTIC ACID, PLASMA
Lactic Acid, Venous: 1.1 mmol/L (ref 0.5–1.9)
Lactic Acid, Venous: 1 mmol/L (ref 0.5–1.9)

## 2019-07-14 LAB — PHOSPHORUS: Phosphorus: 2.8 mg/dL (ref 2.5–4.6)

## 2019-07-14 LAB — TSH: TSH: 0.191 u[IU]/mL — ABNORMAL LOW (ref 0.350–4.500)

## 2019-07-14 LAB — MAGNESIUM: Magnesium: 1.6 mg/dL — ABNORMAL LOW (ref 1.7–2.4)

## 2019-07-14 LAB — C-REACTIVE PROTEIN: CRP: 4.9 mg/dL — ABNORMAL HIGH (ref <1.0)

## 2019-07-14 MED ORDER — SODIUM CHLORIDE 0.9 % IV SOLN: INTRAVENOUS | Status: DC

## 2019-07-14 MED ORDER — ACETAMINOPHEN 650 MG RE SUPP: 650.0000 mg | Freq: Four times a day (QID) | RECTAL | Status: DC | PRN

## 2019-07-14 MED ORDER — POTASSIUM CHLORIDE CRYS ER 20 MEQ PO TBCR
40.0000 meq | EXTENDED_RELEASE_TABLET | Freq: Once | ORAL | Status: AC
  Administered 2019-07-14: 40 meq via ORAL
  Filled 2019-07-14: qty 2

## 2019-07-14 MED ORDER — ACETAMINOPHEN 325 MG PO TABS
650.0000 mg | ORAL_TABLET | Freq: Four times a day (QID) | ORAL | Status: DC | PRN
  Administered 2019-07-14 (×2): 650 mg via ORAL
  Filled 2019-07-14 (×2): qty 2

## 2019-07-14 MED ORDER — HYDROCODONE-ACETAMINOPHEN 5-325 MG PO TABS: 1.0000 | ORAL_TABLET | ORAL | Status: DC | PRN

## 2019-07-14 MED ORDER — ONDANSETRON HCL 4 MG PO TABS: 4.0000 mg | ORAL_TABLET | Freq: Four times a day (QID) | ORAL | Status: DC | PRN

## 2019-07-14 MED ORDER — ALPRAZOLAM 0.25 MG PO TABS: 0.2500 mg | ORAL_TABLET | Freq: Three times a day (TID) | ORAL | Status: DC | PRN

## 2019-07-14 MED ORDER — MAGNESIUM SULFATE 2 GM/50ML IV SOLN
2.0000 g | Freq: Once | INTRAVENOUS | Status: AC
  Administered 2019-07-14: 2 g via INTRAVENOUS
  Filled 2019-07-14: qty 50

## 2019-07-14 MED ORDER — LOPERAMIDE HCL 2 MG PO CAPS
4.0000 mg | ORAL_CAPSULE | ORAL | Status: DC | PRN
  Administered 2019-07-14: 4 mg via ORAL
  Filled 2019-07-14: qty 2

## 2019-07-14 MED ORDER — ENOXAPARIN SODIUM 40 MG/0.4ML ~~LOC~~ SOLN
40.0000 mg | SUBCUTANEOUS | Status: DC
  Administered 2019-07-14: 40 mg via SUBCUTANEOUS
  Filled 2019-07-14 (×2): qty 0.4

## 2019-07-14 MED ORDER — ONDANSETRON HCL 4 MG/2ML IJ SOLN
4.0000 mg | Freq: Four times a day (QID) | INTRAMUSCULAR | Status: DC | PRN
  Administered 2019-07-14: 4 mg via INTRAVENOUS
  Filled 2019-07-14: qty 2

## 2019-07-14 NOTE — ED Notes (Signed)
Ordered breakfast--Johnny Delgado 

## 2019-07-14 NOTE — Plan of Care (Signed)
  Problem: Health Behavior/Discharge Planning: Goal: Ability to manage health-related needs will improve Outcome: Progressing   Problem: Elimination: Goal: Will not experience complications related to bowel motility Outcome: Progressing   

## 2019-07-14 NOTE — Progress Notes (Signed)
PROGRESS NOTE        PATIENT DETAILS Name: Johnny Delgado Age: 24 y.o. Sex: male Date of Birth: 1996-02-26 Admit Date: 07/13/2019 Admitting Physician Evalee Mutton Kristeen Mans, MD [id number], Cari Caraway, MD (Inactive)  Brief Narrative: Patient is a 24 y.o. male with history of ADHD-not in any medications due to financial issues-presented with nausea/vomiting and diarrhea-1 day prior to this hospital admission.  Covid negative-admitted to the hospitalist service for further eval and treatment.  Subjective: Nausea and vomiting have improved significantly-continues to have diarrhea.  Assessment/Plan: SIRS secondary to gastroenteritis: Highly suspicious for a viral syndrome-COVID-19 negative.  Although nausea/vomiting have improved-continues to have diarrhea.  C. difficile PCR negative-awaiting GI pathogen panel.  Start as needed Imodium.  Continue with IV fluids-advance diet slowly as tolerated per nursing discretion.  Await blood cultures-but in the meantime-since suspicion for viral syndrome-we will go and stop all antibiotics and just monitor off antibiotics.  Antibiotics: Vancomycin: 2/7>> 2/8 Cefepime:2/7>> 2/8 Flagyl:2/7>> 2/8  Hypokalemia: Secondary to GI loss-replete and recheck  Hypomagnesemia: Secondary to GI loss-replete and recheck.  Mild sinus tachycardia: From dehydration/anxiety-supportive care.  TSH within normal limits.  Thrombocytopenia: Could be from viral syndrome-but does appear to have some amount of mild chronic thrombocytopenia at baseline.  Follow for now.  ADHD: Not taking any medications due to financial reasons (per patient report)  Diet: Diet Order            Diet clear liquid Room service appropriate? Yes; Fluid consistency: Thin  Diet effective now               DVT Prophylaxis: Prophylactic Lovenox -watch platelets  Code Status: Full code  Family Communication: Mother over the phone  Disposition Plan: Home  when ready for  discharge  Barriers to Discharge: Resolving gastroenteritis-still with diarrhea-tachycardic  Antimicrobial agents: Anti-infectives (From admission, onward)   Start     Dose/Rate Route Frequency Ordered Stop   07/14/19 1100  vancomycin (VANCOREADY) IVPB 1250 mg/250 mL     1,250 mg 166.7 mL/hr over 90 Minutes Intravenous Every 12 hours 07/13/19 2218     07/14/19 0600  ceFEPIme (MAXIPIME) 2 g in sodium chloride 0.9 % 100 mL IVPB     2 g 200 mL/hr over 30 Minutes Intravenous Every 8 hours 07/13/19 2218     07/13/19 2230  vancomycin (VANCOREADY) IVPB 1500 mg/300 mL     1,500 mg 150 mL/hr over 120 Minutes Intravenous  Once 07/13/19 2211 07/14/19 0246   07/13/19 2215  ceFEPIme (MAXIPIME) 2 g in sodium chloride 0.9 % 100 mL IVPB     2 g 200 mL/hr over 30 Minutes Intravenous  Once 07/13/19 2211 07/13/19 2314   07/13/19 2215  metroNIDAZOLE (FLAGYL) IVPB 500 mg     500 mg 100 mL/hr over 60 Minutes Intravenous Every 8 hours 07/13/19 2211        Procedures: None  CONSULTS:  None  Time spent: 25 minutes-Greater than 50% of this time was spent in counseling, explanation of diagnosis, planning of further management, and coordination of care.  MEDICATIONS: Scheduled Meds: Continuous Infusions: . sodium chloride 150 mL/hr at 07/14/19 0249  . ceFEPime (MAXIPIME) IV Stopped (07/14/19 7253)  . magnesium sulfate bolus IVPB 2 g (07/14/19 0955)  . metronidazole Stopped (07/14/19 0850)  . vancomycin     PRN Meds:.acetaminophen **OR** acetaminophen, HYDROcodone-acetaminophen, loperamide, ondansetron **OR**  ondansetron (ZOFRAN) IV   PHYSICAL EXAM: Vital signs: Vitals:   07/14/19 0730 07/14/19 0730 07/14/19 0732 07/14/19 0830  BP: 116/64 116/64  117/72  Pulse: (!) 126 (!) 122    Resp: (!) 21 20  (!) 25  Temp:  99.2 F (37.3 C)    TempSrc:  Oral    SpO2: 96% 97% 97%   Weight:  83.9 kg    Height:  5\' 11"  (1.803 m)     Filed Weights   07/14/19 0730  Weight: 83.9 kg   Body mass index  is 25.8 kg/m.   Gen Exam:Alert awake-not in any distress HEENT:atraumatic, normocephalic Chest: B/L clear to auscultation anteriorly CVS:S1S2 regular Abdomen:soft non tender, non distended Extremities:no edema Neurology: Non focal Skin: no rash  I have personally reviewed following labs and imaging studies  LABORATORY DATA: CBC: Recent Labs  Lab 07/13/19 1034 07/13/19 2212 07/14/19 0355  WBC 16.8* 9.0 7.5  NEUTROABS  --  7.7  --   HGB 18.2* 14.9 14.3  HCT 51.9 45.6 43.2  MCV 84.3 88.7 88.0  PLT 151 103* 83*    Basic Metabolic Panel: Recent Labs  Lab 07/13/19 1034 07/13/19 1941 07/13/19 2212 07/14/19 0355  NA 139  --  139 138  K 4.6  --  3.8 3.4*  CL 108  --  110 110  CO2 20*  --  14* 19*  GLUCOSE 106*  --  112* 111*  BUN 17  --  11 9  CREATININE 0.97  --  0.75 0.83  CALCIUM 10.2  --  8.3* 7.8*  MG  --  1.9  --  1.6*  PHOS  --   --   --  2.8    GFR: Estimated Creatinine Clearance: 148.7 mL/min (by C-G formula based on SCr of 0.83 mg/dL).  Liver Function Tests: Recent Labs  Lab 07/13/19 1034 07/13/19 2212 07/14/19 0355  AST 42* 22 20  ALT 34 22 22  ALKPHOS 45 28* 27*  BILITOT 1.5* 0.8 1.2  PROT 7.9 6.4* 5.7*  ALBUMIN 5.1* 3.7 3.4*   Recent Labs  Lab 07/13/19 1034  LIPASE 22   No results for input(s): AMMONIA in the last 168 hours.  Coagulation Profile: No results for input(s): INR, PROTIME in the last 168 hours.  Cardiac Enzymes: Recent Labs  Lab 07/13/19 1941  CKTOTAL 71    BNP (last 3 results) No results for input(s): PROBNP in the last 8760 hours.  Lipid Profile: No results for input(s): CHOL, HDL, LDLCALC, TRIG, CHOLHDL, LDLDIRECT in the last 72 hours.  Thyroid Function Tests: Recent Labs    07/13/19 2005 07/14/19 0031  TSH 0.191*  --   FREET4  --  0.84    Anemia Panel: Recent Labs    07/13/19 2017  FERRITIN 84    Urine analysis:    Component Value Date/Time   COLORURINE YELLOW 07/13/2019 1425   APPEARANCEUR  CLEAR 07/13/2019 1425   LABSPEC >1.046 (H) 07/13/2019 1425   PHURINE 5.0 07/13/2019 1425   GLUCOSEU NEGATIVE 07/13/2019 1425   HGBUR NEGATIVE 07/13/2019 1425   BILIRUBINUR NEGATIVE 07/13/2019 1425   KETONESUR 20 (A) 07/13/2019 1425   PROTEINUR NEGATIVE 07/13/2019 1425   NITRITE NEGATIVE 07/13/2019 1425   LEUKOCYTESUR NEGATIVE 07/13/2019 1425    Sepsis Labs: Lactic Acid, Venous    Component Value Date/Time   LATICACIDVEN 1.0 07/14/2019 0248    MICROBIOLOGY: Recent Results (from the past 240 hour(s))  Respiratory Panel by RT PCR (Flu A&B, Covid) -  Nasopharyngeal Swab     Status: None   Collection Time: 07/13/19  8:42 PM   Specimen: Nasopharyngeal Swab  Result Value Ref Range Status   SARS Coronavirus 2 by RT PCR NEGATIVE NEGATIVE Final    Comment: (NOTE) SARS-CoV-2 target nucleic acids are NOT DETECTED. The SARS-CoV-2 RNA is generally detectable in upper respiratoy specimens during the acute phase of infection. The lowest concentration of SARS-CoV-2 viral copies this assay can detect is 131 copies/mL. A negative result does not preclude SARS-Cov-2 infection and should not be used as the sole basis for treatment or other patient management decisions. A negative result may occur with  improper specimen collection/handling, submission of specimen other than nasopharyngeal swab, presence of viral mutation(s) within the areas targeted by this assay, and inadequate number of viral copies (<131 copies/mL). A negative result must be combined with clinical observations, patient history, and epidemiological information. The expected result is Negative. Fact Sheet for Patients:  https://www.moore.com/ Fact Sheet for Healthcare Providers:  https://www.young.biz/ This test is not yet ap proved or cleared by the [place] FDA and  has been authorized for detection and/or diagnosis of SARS-CoV-2 by FDA under an Emergency Use Authorization (EUA).  This EUA will remain  in effect (meaning this test can be used) for the duration of the COVID-19 declaration under Section 564(b)(1) of the Act, 21 U.S.C. section 360bbb-3(b)(1), unless the authorization is terminated or revoked sooner.    Influenza A by PCR NEGATIVE NEGATIVE Final   Influenza B by PCR NEGATIVE NEGATIVE Final    Comment: (NOTE) The Xpert Xpress SARS-CoV-2/FLU/RSV assay is intended as an aid in  the diagnosis of influenza from Nasopharyngeal swab specimens and  should not be used as a sole basis for treatment. Nasal washings and  aspirates are unacceptable for Xpert Xpress SARS-CoV-2/FLU/RSV  testing. Fact Sheet for Patients: https://www.moore.com/ Fact Sheet for Healthcare Providers: https://www.young.biz/ This test is not yet approved or cleared by the [place] FDA and  has been authorized for detection and/or diagnosis of SARS-CoV-2 by  FDA under an Emergency Use Authorization (EUA). This EUA will remain  in effect (meaning this test can be used) for the duration of the  Covid-19 declaration under Section 564(b)(1) of the Act, 21  U.S.C. section 360bbb-3(b)(1), unless the authorization is  terminated or revoked. Performed at [facility], [address] [place], [place] [address]   Respiratory Panel by PCR     Status: None   Collection Time: 07/13/19  8:42 PM   Specimen: Nasopharyngeal Swab; Respiratory  Result Value Ref Range Status   Adenovirus NOT DETECTED NOT DETECTED Final   Coronavirus 229E NOT DETECTED NOT DETECTED Final    Comment: (NOTE) The Coronavirus on the Respiratory Panel, DOES NOT test for the novel  Coronavirus (2019 nCoV)    Coronavirus HKU1 NOT DETECTED NOT DETECTED Final   Coronavirus NL63 NOT DETECTED NOT DETECTED Final   Coronavirus OC43 NOT DETECTED NOT DETECTED Final   Metapneumovirus NOT DETECTED NOT DETECTED Final   Rhinovirus / Enterovirus NOT DETECTED NOT DETECTED Final    Influenza A NOT DETECTED NOT DETECTED Final   Influenza B NOT DETECTED NOT DETECTED Final   Parainfluenza Virus 1 NOT DETECTED NOT DETECTED Final   Parainfluenza Virus 2 NOT DETECTED NOT DETECTED Final   Parainfluenza Virus 3 NOT DETECTED NOT DETECTED Final   Parainfluenza Virus 4 NOT DETECTED NOT DETECTED Final   Respiratory Syncytial Virus NOT DETECTED NOT DETECTED Final   Bordetella pertussis NOT  DETECTED NOT DETECTED Final   Chlamydophila pneumoniae NOT DETECTED NOT DETECTED Final   Mycoplasma pneumoniae NOT DETECTED NOT DETECTED Final    Comment: Performed at [facility], [address], [place], [place] [address]  SARS CORONAVIRUS 2 (TAT 6-24 HRS) Nasopharyngeal Nasopharyngeal Swab     Status: None   Collection Time: 07/13/19  8:50 PM   Specimen: Nasopharyngeal Swab  Result Value Ref Range Status   SARS Coronavirus 2 NEGATIVE NEGATIVE Final    Comment: (NOTE) SARS-CoV-2 target nucleic acids are NOT DETECTED. The SARS-CoV-2 RNA is generally detectable in upper and lower respiratory specimens during the acute phase of infection. Negative results do not preclude SARS-CoV-2 infection, do not rule out co-infections with other pathogens, and should not be used as the sole basis for treatment or other patient management decisions. Negative results must be combined with clinical observations, patient history, and epidemiological information. The expected result is Negative. Fact Sheet for Patients: HairSlick.no Fact Sheet for Healthcare Providers: quierodirigir.com This test is not yet approved or cleared by the [place] FDA and  has been authorized for detection and/or diagnosis of SARS-CoV-2 by FDA under an Emergency Use Authorization (EUA). This EUA will remain  in effect (meaning this test can be used) for the duration of the COVID-19 declaration under Section 56 4(b)(1) of the Act, 21 U.S.C. section 360bbb-3(b)(1),  unless the authorization is terminated or revoked sooner. Performed at [facility], [address], [place], [place] [address]   C difficile quick scan w PCR reflex     Status: None   Collection Time: 07/13/19 11:40 PM   Specimen: STOOL  Result Value Ref Range Status   C Diff antigen NEGATIVE NEGATIVE Final   C Diff toxin NEGATIVE NEGATIVE Final   C Diff interpretation No C. difficile detected.  Final    Comment: Performed at [facility], [address] [place], [place] [address]    RADIOLOGY STUDIES/RESULTS: CT Abdomen Pelvis W Contrast  Result Date: 07/13/2019 CLINICAL DATA:  Vomiting and diarrhea. EXAM: CT ABDOMEN AND PELVIS WITH CONTRAST TECHNIQUE: Multidetector CT imaging of the abdomen and pelvis was performed using the standard protocol following bolus administration of intravenous contrast. CONTRAST:  OMNIPAQUE IOHEXOL 300 MG/ML  SOLN COMPARISON:  No comparison studies available. FINDINGS: Lower chest: Unremarkable. Hepatobiliary: No suspicious focal abnormality within the liver parenchyma. There is no evidence for gallstones, gallbladder wall thickening, or pericholecystic fluid. No intrahepatic or extrahepatic biliary dilation. Pancreas: No focal mass lesion. No dilatation of the main duct. No intraparenchymal cyst. No peripancreatic edema. Spleen: No splenomegaly. No focal mass lesion. Adrenals/Urinary Tract: No adrenal nodule or mass. Kidneys unremarkable. No evidence for hydroureter. The urinary bladder appears normal for the degree of distention. Stomach/Bowel: Stomach is unremarkable. No gastric wall thickening. No evidence of outlet obstruction. Duodenum is normally positioned as is the ligament of Treitz. No small bowel wall thickening. No small bowel dilatation. The terminal ileum is normal. The appendix is normal. No gross colonic mass. No colonic wall thickening. Fluid is visible in the rectum compatible with the reported clinical history of diarrhea. There  is no edema or inflammation around small bowel or colon. Vascular/Lymphatic: No abdominal aortic aneurysm. No abdominal aortic atherosclerotic calcification. Portal vein and superior mesenteric vein are patent. Celiac axis, SMA, and IMA are widely patent. There is no gastrohepatic or hepatoduodenal ligament lymphadenopathy. No retroperitoneal or mesenteric lymphadenopathy. No pelvic sidewall lymphadenopathy. Reproductive: The prostate gland and seminal vesicles are  unremarkable. Other: No intraperitoneal free fluid. Musculoskeletal: No worrisome lytic or sclerotic osseous abnormality. IMPRESSION: No acute findings in the abdomen or pelvis. Specifically, no evidence to explain the patient's history of vomiting and diarrhea. Electronically Signed   By: Kennith Center M.D.   On: 07/13/2019 13:08   DG CHEST PORT 1 VIEW  Result Date: 07/13/2019 CLINICAL DATA:  Nausea vomiting EXAM: PORTABLE CHEST 1 VIEW COMPARISON:  None. FINDINGS: The heart size and mediastinal contours are within normal limits. Both lungs are clear. The visualized skeletal structures are unremarkable. IMPRESSION: No active disease. Electronically Signed   By: Jonna Clark M.D.   On: 07/13/2019 21:06     LOS: 0 days   Jeoffrey Massed, MD  [facility]    To contact the attending provider between 7A-7P or the covering provider during after hours 7P-7A, please log into the web site [email or web address] and access using universal [place] password for that web site. If you do not have the password, please call the hospital operator.  07/14/2019, 10:18 AM

## 2019-07-15 LAB — BASIC METABOLIC PANEL
Anion gap: 6 (ref 5–15)
BUN: 5 mg/dL — ABNORMAL LOW (ref 6–20)
CO2: 23 mmol/L (ref 22–32)
Calcium: 8.3 mg/dL — ABNORMAL LOW (ref 8.9–10.3)
Chloride: 110 mmol/L (ref 98–111)
Creatinine, Ser: 0.72 mg/dL (ref 0.61–1.24)
GFR calc Af Amer: >60 mL/min (ref >60)
GFR calc non Af Amer: >60 mL/min (ref >60)
Glucose, Bld: 95 mg/dL (ref 70–99)
Potassium: 3.5 mmol/L (ref 3.5–5.1)
Sodium: 139 mmol/L (ref 135–145)

## 2019-07-15 LAB — CBC
HCT: 39 % (ref 39.0–52.0)
Hemoglobin: 13.1 g/dL (ref 13.0–17.0)
MCH: 29.1 pg (ref 26.0–34.0)
MCHC: 33.6 g/dL (ref 30.0–36.0)
MCV: 86.7 fL (ref 80.0–100.0)
Platelets: 61 10*3/uL — ABNORMAL LOW (ref 150–400)
RBC: 4.5 MIL/uL (ref 4.22–5.81)
RDW: 12.1 % (ref 11.5–15.5)
WBC: 5.9 10*3/uL (ref 4.0–10.5)
nRBC: 0 % (ref 0.0–0.2)

## 2019-07-15 LAB — MAGNESIUM: Magnesium: 2 mg/dL (ref 1.7–2.4)

## 2019-07-15 LAB — T3: T3, Total: 83 ng/dL (ref 71–180)

## 2019-07-15 MED ORDER — ONDANSETRON HCL 4 MG PO TABS: 4.0000 mg | ORAL_TABLET | Freq: Four times a day (QID) | ORAL | 0 refills | Status: DC | PRN

## 2019-07-15 MED ORDER — LOPERAMIDE HCL 2 MG PO CAPS: 4.0000 mg | ORAL_CAPSULE | ORAL | 0 refills | Status: DC | PRN

## 2019-07-15 NOTE — Discharge Summary (Addendum)
PATIENT DETAILS Name: Johnny Delgado Age: 24 y.o. Sex: male Date of Birth: 11/17/1995 MRN: [id number]. Admitting Physician: Maretta BeesShanker M Carry Weesner, MD WUJ:[id number]:Yousef, Celso Sickleeema, MD (Inactive)  Admit Date: 07/13/2019 Discharge date: 07/15/2019  Recommendations for Outpatient Follow-up:  1. Follow up with PCP in 1-2 weeks 2. Please obtain CMP/CBC in one week 3. Has chronic thrombocytopenia-needs work-up in the outpatient setting. 4. GI pathogen panel pending-please follow.  Admitted From:  Home  Disposition: Home    Home Health: No  Equipment/Devices: None  Discharge Condition: Stable  CODE STATUS: FULL CODE  Diet recommendation:  Diet Order            Diet general        DIET SOFT Room service appropriate? Yes; Fluid consistency: Thin  Diet effective now               Brief Summary: See H&P, Labs, Consult and Test reports for all details in brief, Patient is a 24 y.o. male with history of ADHD-not in any medications due to financial issues-presented with nausea/vomiting and diarrhea-1 day prior to this hospital admission.  Covid negative-admitted to the hospitalist service for further eval and treatment.  Brief Hospital Course: SIRS secondary to gastroenteritis: Highly suspicious for a viral syndrome-COVID-19 negative.    Managed with symptomatic support-nausea, vomiting and diarrhea have completely resolved.  C. difficile PCR negative-awaiting GI pathogen panel which will need to be followed by PCP.  Stool C. difficile PCR was negative.  COVID-19 was negative.  Blood cultures remain negative.  Although he was started on antibiotics on admission-this was discontinued on 2/8-he remains stable with greatly improved symptomatology.  He is stable for discharge.  I have asked him to follow-up with his primary care practitioner to follow final blood culture results and a GI pathogen panel.   Antibiotics: Vancomycin: 2/7>> 2/8 Cefepime:2/7>> 2/8 Flagyl:2/7>> 2/8  Hypokalemia:  Secondary to GI loss-repleted  Hypomagnesemia: Secondary to GI loss-repleted  Mild sinus tachycardia: From dehydration/anxiety-resolved with supportive care.  TSH within normal limits.  Thrombocytopenia: Could be from viral syndrome-but does appear to have some amount of mild chronic thrombocytopenia at baseline.  I have asked patient to follow-up with his primary care practitioner-and suspect he may need outpatient hematology referral.  ADHD/depression: Not taking any medications due to financial reasons (per patient report).  Patient denies any suicidal/homicidal ideation-his mood seems to be stable.  Procedures/ None  Discharge Diagnoses:  Active Problems:   Severe recurrent major depression without psychotic features (HCC)   ADHD (attention deficit hyperactivity disorder), inattentive type   Dehydration   Gastroenteritis   Discharge Instructions:  Activity:  As tolerated  Discharge Instructions    Call MD for:  persistant dizziness or light-headedness   Complete by: As directed    Call MD for:  persistant nausea and vomiting   Complete by: As directed    Diet general   Complete by: As directed    Discharge instructions   Complete by: As directed    Follow with Primary MD  Hoyt KochYousef, Deema, MD (Inactive) in 1-2 weeks  Your platelet count has been low for the past several years-please ask your primary care practitioner to refer you to a hematologist (blood doctor)  You should follow-up with your psychiatrist to see if you should be resumed on your depression/anxiety/ADHD medications  Please get a complete blood count and chemistry panel checked by your Primary MD at your next visit, and again as instructed by your Primary MD.  Get Medicines  reviewed and adjusted: Please take all your medications with you for your next visit with your Primary MD  Laboratory/radiological data: Please request your Primary MD to go over all hospital tests and procedure/radiological  results at the follow up, please ask your Primary MD to get all Hospital records sent to his/her office.  In some cases, they will be blood work, cultures and biopsy results pending at the time of your discharge. Please request that your primary care M.D. follows up on these results.  Also Note the following: If you experience worsening of your admission symptoms, develop shortness of breath, life threatening emergency, suicidal or homicidal thoughts you must seek medical attention immediately by calling 911 or calling your MD immediately  if symptoms less severe.  You must read complete instructions/literature along with all the possible adverse reactions/side effects for all the Medicines you take and that have been prescribed to you. Take any new Medicines after you have completely understood and accpet all the possible adverse reactions/side effects.   Do not drive when taking Pain medications or sleeping medications (Benzodaizepines)  Do not take more than prescribed Pain, Sleep and Anxiety Medications. It is not advisable to combine anxiety,sleep and pain medications without talking with your primary care practitioner  Special Instructions: If you have smoked or chewed Tobacco  in the last 2 yrs please stop smoking, stop any regular Alcohol  and or any Recreational drug use.  Wear Seat belts while driving.  Please note: You were cared for by a hospitalist during your hospital stay. Once you are discharged, your primary care physician will handle any further medical issues. Please note that NO REFILLS for any discharge medications will be authorized once you are discharged, as it is imperative that you return to your primary care physician (or establish a relationship with a primary care physician if you do not have one) for your post hospital discharge needs so that they can reassess your need for medications and monitor your lab values.   Increase activity slowly   Complete by: As  directed      Allergies as of 07/15/2019      Reactions   Banana Diarrhea      Medication List    STOP taking these medications   amphetamine-dextroamphetamine 10 MG tablet Commonly known as: Adderall   FLUoxetine 40 MG capsule Commonly known as: PROZAC   hydrOXYzine 10 MG tablet Commonly known as: ATARAX/VISTARIL   MELATONIN PO   QUEtiapine 50 MG tablet Commonly known as: SEROQUEL     TAKE these medications   loperamide 2 MG capsule Commonly known as: IMODIUM Take 2 capsules (4 mg total) by mouth as needed for diarrhea or loose stools.   ondansetron 4 MG tablet Commonly known as: ZOFRAN Take 1 tablet (4 mg total) by mouth every 6 (six) hours as needed for nausea or vomiting.      Follow-up Information    Damaris Hippo, MD. Schedule an appointment as soon as possible for a visit in 1 week(s).   Specialty: Family Medicine Contact information: [place] [address] [phone]          Allergies  Allergen Reactions  . Banana Diarrhea    Consultations:   None   Other Procedures/Studies: CT Abdomen Pelvis W Contrast  Result Date: 07/13/2019 CLINICAL DATA:  Vomiting and diarrhea. EXAM: CT ABDOMEN AND PELVIS WITH CONTRAST TECHNIQUE: Multidetector CT imaging of the abdomen and pelvis was performed using the standard protocol following  bolus administration of intravenous contrast. CONTRAST:  OMNIPAQUE IOHEXOL 300 MG/ML  SOLN COMPARISON:  No comparison studies available. FINDINGS: Lower chest: Unremarkable. Hepatobiliary: No suspicious focal abnormality within the liver parenchyma. There is no evidence for gallstones, gallbladder wall thickening, or pericholecystic fluid. No intrahepatic or extrahepatic biliary dilation. Pancreas: No focal mass lesion. No dilatation of the main duct. No intraparenchymal cyst. No peripancreatic edema. Spleen: No splenomegaly. No focal mass lesion. Adrenals/Urinary Tract: No adrenal nodule or mass.  Kidneys unremarkable. No evidence for hydroureter. The urinary bladder appears normal for the degree of distention. Stomach/Bowel: Stomach is unremarkable. No gastric wall thickening. No evidence of outlet obstruction. Duodenum is normally positioned as is the ligament of Treitz. No small bowel wall thickening. No small bowel dilatation. The terminal ileum is normal. The appendix is normal. No gross colonic mass. No colonic wall thickening. Fluid is visible in the rectum compatible with the reported clinical history of diarrhea. There is no edema or inflammation around small bowel or colon. Vascular/Lymphatic: No abdominal aortic aneurysm. No abdominal aortic atherosclerotic calcification. Portal vein and superior mesenteric vein are patent. Celiac axis, SMA, and IMA are widely patent. There is no gastrohepatic or hepatoduodenal ligament lymphadenopathy. No retroperitoneal or mesenteric lymphadenopathy. No pelvic sidewall lymphadenopathy. Reproductive: The prostate gland and seminal vesicles are unremarkable. Other: No intraperitoneal free fluid. Musculoskeletal: No worrisome lytic or sclerotic osseous abnormality. IMPRESSION: No acute findings in the abdomen or pelvis. Specifically, no evidence to explain the patient's history of vomiting and diarrhea. Electronically Signed   By: Kennith Center M.D.   On: 07/13/2019 13:08   DG CHEST PORT 1 VIEW  Result Date: 07/13/2019 CLINICAL DATA:  Nausea vomiting EXAM: PORTABLE CHEST 1 VIEW COMPARISON:  None. FINDINGS: The heart size and mediastinal contours are within normal limits. Both lungs are clear. The visualized skeletal structures are unremarkable. IMPRESSION: No active disease. Electronically Signed   By: Jonna Clark M.D.   On: 07/13/2019 21:06     TODAY-DAY OF DISCHARGE:  Subjective:   Ashvin Adelson today has no headache,no chest abdominal pain,no new weakness tingling or numbness, feels much better wants to go home today.   Objective:   Blood pressure  119/74, pulse 97, temperature 99.3 F (37.4 C), temperature source Oral, resp. rate 18, height 5\' 11"  (1.803 m), weight 83.9 kg, SpO2 98 %.  Intake/Output Summary (Last 24 hours) at 07/15/2019 1002 Last data filed at 07/15/2019 0527 Gross per 24 hour  Intake 4326.96 ml  Output 0 ml  Net 4326.96 ml   Filed Weights   07/14/19 0730  Weight: 83.9 kg    Exam: Awake Alert, Oriented *3, No new F.N deficits, Normal affect [place].AT,PERRAL Supple Neck,No JVD, No cervical lymphadenopathy appriciated.  Symmetrical Chest wall movement, Good air movement bilaterally, CTAB RRR,No Gallops,Rubs or new Murmurs, No Parasternal Heave +ve B.Sounds, Abd Soft, Non tender, No organomegaly appriciated, No rebound -guarding or rigidity. No Cyanosis, Clubbing or edema, No new Rash or bruise   PERTINENT RADIOLOGIC STUDIES: CT Abdomen Pelvis W Contrast  Result Date: 07/13/2019 CLINICAL DATA:  Vomiting and diarrhea. EXAM: CT ABDOMEN AND PELVIS WITH CONTRAST TECHNIQUE: Multidetector CT imaging of the abdomen and pelvis was performed using the standard protocol following bolus administration of intravenous contrast. CONTRAST:  09/10/2019 OMNIPAQUE IOHEXOL 300 MG/ML  SOLN COMPARISON:  No comparison studies available. FINDINGS: Lower chest: Unremarkable. Hepatobiliary: No suspicious focal abnormality within the liver parenchyma. There is no evidence for gallstones, gallbladder wall thickening, or pericholecystic fluid. No intrahepatic or  extrahepatic biliary dilation. Pancreas: No focal mass lesion. No dilatation of the main duct. No intraparenchymal cyst. No peripancreatic edema. Spleen: No splenomegaly. No focal mass lesion. Adrenals/Urinary Tract: No adrenal nodule or mass. Kidneys unremarkable. No evidence for hydroureter. The urinary bladder appears normal for the degree of distention. Stomach/Bowel: Stomach is unremarkable. No gastric wall thickening. No evidence of outlet obstruction. Duodenum is normally positioned as is the  ligament of Treitz. No small bowel wall thickening. No small bowel dilatation. The terminal ileum is normal. The appendix is normal. No gross colonic mass. No colonic wall thickening. Fluid is visible in the rectum compatible with the reported clinical history of diarrhea. There is no edema or inflammation around small bowel or colon. Vascular/Lymphatic: No abdominal aortic aneurysm. No abdominal aortic atherosclerotic calcification. Portal vein and superior mesenteric vein are patent. Celiac axis, SMA, and IMA are widely patent. There is no gastrohepatic or hepatoduodenal ligament lymphadenopathy. No retroperitoneal or mesenteric lymphadenopathy. No pelvic sidewall lymphadenopathy. Reproductive: The prostate gland and seminal vesicles are unremarkable. Other: No intraperitoneal free fluid. Musculoskeletal: No worrisome lytic or sclerotic osseous abnormality. IMPRESSION: No acute findings in the abdomen or pelvis. Specifically, no evidence to explain the patient's history of vomiting and diarrhea. Electronically Signed   By: Kennith Center M.D.   On: 07/13/2019 13:08   DG CHEST PORT 1 VIEW  Result Date: 07/13/2019 CLINICAL DATA:  Nausea vomiting EXAM: PORTABLE CHEST 1 VIEW COMPARISON:  None. FINDINGS: The heart size and mediastinal contours are within normal limits. Both lungs are clear. The visualized skeletal structures are unremarkable. IMPRESSION: No active disease. Electronically Signed   By: Jonna Clark M.D.   On: 07/13/2019 21:06     PERTINENT LAB RESULTS: CBC: Recent Labs    07/14/19 0355 07/15/19 0548  WBC 7.5 5.9  HGB 14.3 13.1  HCT 43.2 39.0  PLT 83* 61*   CMET CMP     Component Value Date/Time   NA 139 07/15/2019 0548   K 3.5 07/15/2019 0548   CL 110 07/15/2019 0548   CO2 23 07/15/2019 0548   GLUCOSE 95 07/15/2019 0548   BUN 5 (L) 07/15/2019 0548   CREATININE 0.72 07/15/2019 0548   CALCIUM 8.3 (L) 07/15/2019 0548   PROT 5.7 (L) 07/14/2019 0355   ALBUMIN 3.4 (L) 07/14/2019  0355   AST 20 07/14/2019 0355   ALT 22 07/14/2019 0355   ALKPHOS 27 (L) 07/14/2019 0355   BILITOT 1.2 07/14/2019 0355   GFRNONAA >60 07/15/2019 0548   GFRAA >60 07/15/2019 0548    GFR Estimated Creatinine Clearance: 154.3 mL/min (by C-G formula based on SCr of 0.72 mg/dL). Recent Labs    07/13/19 1034  LIPASE 22   Recent Labs    07/13/19 1941  CKTOTAL 71   Invalid input(s): POCBNP Recent Labs    07/13/19 2212  DDIMER 0.40   No results for input(s): HGBA1C in the last 72 hours. No results for input(s): CHOL, HDL, LDLCALC, TRIG, CHOLHDL, LDLDIRECT in the last 72 hours. Recent Labs    07/13/19 2005  TSH 0.191*   Recent Labs    07/13/19 2017  FERRITIN 84   Coags: No results for input(s): INR in the last 72 hours.  Invalid input(s): PT Microbiology: Recent Results (from the past 240 hour(s))  Respiratory Panel by RT PCR (Flu A&B, Covid) - Nasopharyngeal Swab     Status: None   Collection Time: 07/13/19  8:42 PM   Specimen: Nasopharyngeal Swab  Result Value Ref Range Status  SARS Coronavirus 2 by RT PCR NEGATIVE NEGATIVE Final    Comment: (NOTE) SARS-CoV-2 target nucleic acids are NOT DETECTED. The SARS-CoV-2 RNA is generally detectable in upper respiratoy specimens during the acute phase of infection. The lowest concentration of SARS-CoV-2 viral copies this assay can detect is 131 copies/mL. A negative result does not preclude SARS-Cov-2 infection and should not be used as the sole basis for treatment or other patient management decisions. A negative result may occur with  improper specimen collection/handling, submission of specimen other than nasopharyngeal swab, presence of viral mutation(s) within the areas targeted by this assay, and inadequate number of viral copies (<131 copies/mL). A negative result must be combined with clinical observations, patient history, and epidemiological information. The expected result is Negative. Fact Sheet for Patients:   https://www.moore.com/ Fact Sheet for Healthcare Providers:  https://www.young.biz/ This test is not yet ap proved or cleared by the [place] FDA and  has been authorized for detection and/or diagnosis of SARS-CoV-2 by FDA under an Emergency Use Authorization (EUA). This EUA will remain  in effect (meaning this test can be used) for the duration of the COVID-19 declaration under Section 564(b)(1) of the Act, 21 U.S.C. section 360bbb-3(b)(1), unless the authorization is terminated or revoked sooner.    Influenza A by PCR NEGATIVE NEGATIVE Final   Influenza B by PCR NEGATIVE NEGATIVE Final    Comment: (NOTE) The Xpert Xpress SARS-CoV-2/FLU/RSV assay is intended as an aid in  the diagnosis of influenza from Nasopharyngeal swab specimens and  should not be used as a sole basis for treatment. Nasal washings and  aspirates are unacceptable for Xpert Xpress SARS-CoV-2/FLU/RSV  testing. Fact Sheet for Patients: https://www.moore.com/ Fact Sheet for Healthcare Providers: https://www.young.biz/ This test is not yet approved or cleared by the [place] FDA and  has been authorized for detection and/or diagnosis of SARS-CoV-2 by  FDA under an Emergency Use Authorization (EUA). This EUA will remain  in effect (meaning this test can be used) for the duration of the  Covid-19 declaration under Section 564(b)(1) of the Act, 21  U.S.C. section 360bbb-3(b)(1), unless the authorization is  terminated or revoked. Performed at [facility], [address] [place], [place] [address]   Respiratory Panel by PCR     Status: None   Collection Time: 07/13/19  8:42 PM   Specimen: Nasopharyngeal Swab; Respiratory  Result Value Ref Range Status   Adenovirus NOT DETECTED NOT DETECTED Final   Coronavirus 229E NOT DETECTED NOT DETECTED Final    Comment: (NOTE) The Coronavirus on the Respiratory Panel, DOES NOT test  for the novel  Coronavirus (2019 nCoV)    Coronavirus HKU1 NOT DETECTED NOT DETECTED Final   Coronavirus NL63 NOT DETECTED NOT DETECTED Final   Coronavirus OC43 NOT DETECTED NOT DETECTED Final   Metapneumovirus NOT DETECTED NOT DETECTED Final   Rhinovirus / Enterovirus NOT DETECTED NOT DETECTED Final   Influenza A NOT DETECTED NOT DETECTED Final   Influenza B NOT DETECTED NOT DETECTED Final   Parainfluenza Virus 1 NOT DETECTED NOT DETECTED Final   Parainfluenza Virus 2 NOT DETECTED NOT DETECTED Final   Parainfluenza Virus 3 NOT DETECTED NOT DETECTED Final   Parainfluenza Virus 4 NOT DETECTED NOT DETECTED Final   Respiratory Syncytial Virus NOT DETECTED NOT DETECTED Final   Bordetella pertussis NOT DETECTED NOT DETECTED Final   Chlamydophila pneumoniae NOT DETECTED NOT DETECTED Final   Mycoplasma pneumoniae NOT DETECTED NOT DETECTED Final    Comment: Performed at [facility], [address] [place], [place] [address]  SARS CORONAVIRUS 2 (TAT 6-24 HRS) Nasopharyngeal Nasopharyngeal Swab     Status: None   Collection Time: 07/13/19  8:50 PM   Specimen: Nasopharyngeal Swab  Result Value Ref Range Status   SARS Coronavirus 2 NEGATIVE NEGATIVE Final    Comment: (NOTE) SARS-CoV-2 target nucleic acids are NOT DETECTED. The SARS-CoV-2 RNA is generally detectable in upper and lower respiratory specimens during the acute phase of infection. Negative results do not preclude SARS-CoV-2 infection, do not rule out co-infections with other pathogens, and should not be used as the sole basis for treatment or other patient management decisions. Negative results must be combined with clinical observations, patient history, and epidemiological information. The expected result is Negative. Fact Sheet for Patients: HairSlick.no Fact Sheet for Healthcare Providers: quierodirigir.com This test is not yet approved or cleared by the [place] FDA and  has been authorized for detection and/or diagnosis of SARS-CoV-2 by FDA under an Emergency Use Authorization (EUA). This EUA will remain  in effect (meaning this test can be used) for the duration of the COVID-19 declaration under Section 56 4(b)(1) of the Act, 21 U.S.C. section 360bbb-3(b)(1), unless the authorization is terminated or revoked sooner. Performed at [facility], [address], [place], [place] [address]   Blood culture (routine x 2)     Status: None (Preliminary result)   Collection Time: 07/13/19  9:47 PM   Specimen: BLOOD  Result Value Ref Range Status   Specimen Description BLOOD RIGHT ANTECUBITAL  Final   Special Requests   Final    BOTTLES DRAWN AEROBIC AND ANAEROBIC Blood Culture adequate volume   Culture   Final    NO GROWTH 2 DAYS Performed at [facility], [address], [place], [place] [address]    Report Status PENDING  Incomplete  Blood culture (routine x 2)     Status: None (Preliminary result)   Collection Time: 07/13/19  9:52 PM   Specimen: BLOOD  Result Value Ref Range Status   Specimen Description BLOOD LEFT ANTECUBITAL  Final   Special Requests   Final    BOTTLES DRAWN AEROBIC AND ANAEROBIC Blood Culture results may not be optimal due to an inadequate volume of blood received in culture bottles   Culture   Final    NO GROWTH 2 DAYS Performed at [facility], [address], [place], [place] [address]    Report Status PENDING  Incomplete  C difficile quick scan w PCR reflex     Status: None   Collection Time: 07/13/19 11:40 PM   Specimen: STOOL  Result Value Ref Range Status   C Diff antigen NEGATIVE NEGATIVE Final   C Diff toxin NEGATIVE NEGATIVE Final   C Diff interpretation No C. difficile detected.  Final    Comment: Performed at [facility], [address] [place], [place] [address]    FURTHER DISCHARGE INSTRUCTIONS:  Get Medicines reviewed and adjusted: Please take all your medications  with you for your next visit with your Primary MD  Laboratory/radiological data: Please request your Primary MD to go over all hospital tests and procedure/radiological results at the follow up, please ask your Primary MD to get all Hospital records sent to his/her office.  In some cases, they will be blood work, cultures and biopsy results pending at the time of your discharge. Please request that your primary care M.D. goes through all the  records of your hospital data and follows up on these results.  Also Note the following: If you experience worsening of your admission symptoms, develop shortness of breath, life threatening emergency, suicidal or homicidal thoughts you must seek medical attention immediately by calling 911 or calling your MD immediately  if symptoms less severe.  You must read complete instructions/literature along with all the possible adverse reactions/side effects for all the Medicines you take and that have been prescribed to you. Take any new Medicines after you have completely understood and accpet all the possible adverse reactions/side effects.   Do not drive when taking Pain medications or sleeping medications (Benzodaizepines)  Do not take more than prescribed Pain, Sleep and Anxiety Medications. It is not advisable to combine anxiety,sleep and pain medications without talking with your primary care practitioner  Special Instructions: If you have smoked or chewed Tobacco  in the last 2 yrs please stop smoking, stop any regular Alcohol  and or any Recreational drug use.  Wear Seat belts while driving.  Please note: You were cared for by a hospitalist during your hospital stay. Once you are discharged, your primary care physician will handle any further medical issues. Please note that NO REFILLS for any discharge medications will be authorized once you are discharged, as it is imperative that you return to your primary care physician (or establish a relationship  with a primary care physician if you do not have one) for your post hospital discharge needs so that they can reassess your need for medications and monitor your lab values.  Total Time spent coordinating discharge including counseling, education and face to face time equals 25  minutes.  Signed: Zuria Fosdick 07/15/2019 10:02 AM

## 2019-07-17 LAB — GI PATHOGEN PANEL BY PCR, STOOL

## 2019-07-18 LAB — CULTURE, BLOOD (ROUTINE X 2)
Culture: NO GROWTH
Culture: NO GROWTH
Special Requests: ADEQUATE

## 2019-07-21 DIAGNOSIS — D696 Thrombocytopenia, unspecified: Secondary | ICD-10-CM | POA: Diagnosis not present

## 2019-07-21 DIAGNOSIS — K529 Noninfective gastroenteritis and colitis, unspecified: Secondary | ICD-10-CM | POA: Diagnosis not present

## 2019-08-21 ENCOUNTER — Ambulatory Visit: Payer: BC Managed Care – PPO | Attending: Internal Medicine

## 2019-08-21 DIAGNOSIS — Z23 Encounter for immunization: Secondary | ICD-10-CM

## 2019-08-21 NOTE — Progress Notes (Signed)
   Covid-19 Vaccination Clinic  Name:  Johnny Delgado    MRN: [id number] DOB: 07-24-1995  08/21/2019  Mr. Johnny Delgado was observed post Covid-19 immunization for 15 minutes without incident. He was provided with Vaccine Information Sheet and instruction to access the V-Safe system.   Mr. Johnny Delgado was instructed to call 911 with any severe reactions post vaccine: Marland Kitchen Difficulty breathing  . Swelling of face and throat  . A fast heartbeat  . A bad rash all over body  . Dizziness and weakness   Immunizations Administered    Name Date Dose VIS Date Route   Pfizer COVID-19 Vaccine 08/21/2019  3:22 PM 0.3 mL 05/16/2019 Intramuscular   Manufacturer: [employer], [employer]   Lot: [id number]   NDC: [address]-7

## 2019-09-15 ENCOUNTER — Ambulatory Visit: Payer: Self-pay | Attending: Internal Medicine

## 2019-09-15 DIAGNOSIS — Z23 Encounter for immunization: Secondary | ICD-10-CM

## 2019-09-15 NOTE — Progress Notes (Signed)
   Covid-19 Vaccination Clinic  Name:  Johnny Delgado    MRN: [id number] DOB: 1995/07/01  09/15/2019  [name] was observed post Covid-19 immunization for 15 minutes without incident. He was provided with Vaccine Information Sheet and instruction to access the V-Safe system.   [name] was instructed to call 911 with any severe reactions post vaccine: Marland Kitchen Difficulty breathing  . Swelling of face and throat  . A fast heartbeat  . A bad rash all over body  . Dizziness and weakness   Immunizations Administered    Name Date Dose VIS Date Route   Pfizer COVID-19 Vaccine 09/15/2019  3:39 PM 0.3 mL 05/16/2019 Intramuscular   Manufacturer: [employer], [employer]   Lot: [id number]   NDC: [address]-2

## 2020-04-15 DIAGNOSIS — F413 Other mixed anxiety disorders: Secondary | ICD-10-CM | POA: Diagnosis not present

## 2020-04-15 DIAGNOSIS — F332 Major depressive disorder, recurrent severe without psychotic features: Secondary | ICD-10-CM | POA: Diagnosis not present

## 2020-05-27 DIAGNOSIS — A084 Viral intestinal infection, unspecified: Secondary | ICD-10-CM | POA: Diagnosis not present

## 2020-05-27 DIAGNOSIS — F413 Other mixed anxiety disorders: Secondary | ICD-10-CM | POA: Diagnosis not present

## 2020-08-10 ENCOUNTER — Inpatient Hospital Stay (HOSPITAL_COMMUNITY)
Admission: EM | Admit: 2020-08-10 | Discharge: 2020-08-13 | DRG: 917 | Disposition: A | Discharge status: Other Acute Inpatient Hospital | Payer: BC Managed Care – PPO | Attending: Internal Medicine | Admitting: Internal Medicine

## 2020-08-10 ENCOUNTER — Emergency Department (HOSPITAL_COMMUNITY): Payer: BC Managed Care – PPO

## 2020-08-10 ENCOUNTER — Encounter (HOSPITAL_COMMUNITY): Payer: Self-pay | Admitting: Emergency Medicine

## 2020-08-10 ENCOUNTER — Other Ambulatory Visit: Payer: Self-pay

## 2020-08-10 DIAGNOSIS — Z79899 Other long term (current) drug therapy: Secondary | ICD-10-CM | POA: Diagnosis not present

## 2020-08-10 DIAGNOSIS — Z818 Family history of other mental and behavioral disorders: Secondary | ICD-10-CM

## 2020-08-10 DIAGNOSIS — R9431 Abnormal electrocardiogram [ECG] [EKG]: Secondary | ICD-10-CM

## 2020-08-10 DIAGNOSIS — T391X4A Poisoning by 4-Aminophenol derivatives, undetermined, initial encounter: Secondary | ICD-10-CM | POA: Diagnosis not present

## 2020-08-10 DIAGNOSIS — Z91018 Allergy to other foods: Secondary | ICD-10-CM | POA: Diagnosis not present

## 2020-08-10 DIAGNOSIS — R45851 Suicidal ideations: Secondary | ICD-10-CM | POA: Diagnosis present

## 2020-08-10 DIAGNOSIS — R000 Tachycardia, unspecified: Secondary | ICD-10-CM | POA: Diagnosis not present

## 2020-08-10 DIAGNOSIS — F1210 Cannabis abuse, uncomplicated: Secondary | ICD-10-CM | POA: Diagnosis not present

## 2020-08-10 DIAGNOSIS — T391X1D Poisoning by 4-Aminophenol derivatives, accidental (unintentional), subsequent encounter: Secondary | ICD-10-CM | POA: Diagnosis not present

## 2020-08-10 DIAGNOSIS — Z6831 Body mass index (BMI) 31.0-31.9, adult: Secondary | ICD-10-CM

## 2020-08-10 DIAGNOSIS — J9600 Acute respiratory failure, unspecified whether with hypoxia or hypercapnia: Secondary | ICD-10-CM

## 2020-08-10 DIAGNOSIS — Z978 Presence of other specified devices: Secondary | ICD-10-CM | POA: Diagnosis not present

## 2020-08-10 DIAGNOSIS — F332 Major depressive disorder, recurrent severe without psychotic features: Secondary | ICD-10-CM | POA: Diagnosis present

## 2020-08-10 DIAGNOSIS — Z713 Dietary counseling and surveillance: Secondary | ICD-10-CM

## 2020-08-10 DIAGNOSIS — E876 Hypokalemia: Secondary | ICD-10-CM | POA: Diagnosis not present

## 2020-08-10 DIAGNOSIS — Z20822 Contact with and (suspected) exposure to covid-19: Secondary | ICD-10-CM | POA: Diagnosis present

## 2020-08-10 DIAGNOSIS — R4182 Altered mental status, unspecified: Secondary | ICD-10-CM | POA: Diagnosis present

## 2020-08-10 DIAGNOSIS — Z9151 Personal history of suicidal behavior: Secondary | ICD-10-CM | POA: Diagnosis not present

## 2020-08-10 DIAGNOSIS — F909 Attention-deficit hyperactivity disorder, unspecified type: Secondary | ICD-10-CM | POA: Diagnosis present

## 2020-08-10 DIAGNOSIS — T50902A Poisoning by unspecified drugs, medicaments and biological substances, intentional self-harm, initial encounter: Secondary | ICD-10-CM

## 2020-08-10 DIAGNOSIS — F32A Depression, unspecified: Secondary | ICD-10-CM | POA: Diagnosis not present

## 2020-08-10 DIAGNOSIS — T391X2A Poisoning by 4-Aminophenol derivatives, intentional self-harm, initial encounter: Secondary | ICD-10-CM | POA: Diagnosis not present

## 2020-08-10 DIAGNOSIS — R1110 Vomiting, unspecified: Secondary | ICD-10-CM

## 2020-08-10 DIAGNOSIS — T391X1A Poisoning by 4-Aminophenol derivatives, accidental (unintentional), initial encounter: Secondary | ICD-10-CM | POA: Diagnosis present

## 2020-08-10 DIAGNOSIS — Z4682 Encounter for fitting and adjustment of non-vascular catheter: Secondary | ICD-10-CM | POA: Diagnosis not present

## 2020-08-10 DIAGNOSIS — K56609 Unspecified intestinal obstruction, unspecified as to partial versus complete obstruction: Secondary | ICD-10-CM | POA: Diagnosis not present

## 2020-08-10 DIAGNOSIS — T1491XA Suicide attempt, initial encounter: Secondary | ICD-10-CM | POA: Diagnosis not present

## 2020-08-10 DIAGNOSIS — J9811 Atelectasis: Secondary | ICD-10-CM | POA: Diagnosis not present

## 2020-08-10 DIAGNOSIS — T50901A Poisoning by unspecified drugs, medicaments and biological substances, accidental (unintentional), initial encounter: Secondary | ICD-10-CM | POA: Diagnosis not present

## 2020-08-10 DIAGNOSIS — F419 Anxiety disorder, unspecified: Secondary | ICD-10-CM | POA: Diagnosis not present

## 2020-08-10 LAB — CBG MONITORING, ED: Glucose-Capillary: 134 mg/dL — ABNORMAL HIGH (ref 70–99)

## 2020-08-10 MED ORDER — DEXTROSE 5 % IV SOLN
15.0000 mg/kg/h | INTRAVENOUS | Status: DC
  Administered 2020-08-11 (×2): 15 mg/kg/h via INTRAVENOUS
  Filled 2020-08-10 (×4): qty 120

## 2020-08-10 MED ORDER — ACTIDOSE WITH SORBITOL 50 GM/240ML PO LIQD
50.0000 g | Freq: Once | ORAL | Status: AC
  Administered 2020-08-10: 50 g via ORAL
  Filled 2020-08-10: qty 240

## 2020-08-10 MED ORDER — SODIUM CHLORIDE 0.9 % IV SOLN: INTRAVENOUS | Status: DC

## 2020-08-10 MED ORDER — SODIUM CHLORIDE 0.9 % IV BOLUS
1000.0000 mL | Freq: Once | INTRAVENOUS | Status: AC
  Administered 2020-08-10: 1000 mL via INTRAVENOUS

## 2020-08-10 MED ORDER — SODIUM BICARBONATE 8.4 % IV SOLN
INTRAVENOUS | Status: DC
  Filled 2020-08-10: qty 150
  Filled 2020-08-10: qty 850
  Filled 2020-08-10: qty 150

## 2020-08-10 MED ORDER — ACETYLCYSTEINE LOAD VIA INFUSION: 150.0000 mg/kg | Freq: Once | INTRAVENOUS | Status: DC

## 2020-08-10 MED ORDER — ACETYLCYSTEINE LOAD VIA INFUSION
150.0000 mg/kg | Freq: Once | INTRAVENOUS | Status: AC
  Administered 2020-08-11: 14970 mg via INTRAVENOUS
  Filled 2020-08-10: qty 375

## 2020-08-10 MED ORDER — SODIUM BICARBONATE 8.4 % IV SOLN
150.0000 meq | Freq: Once | INTRAVENOUS | Status: DC
  Filled 2020-08-10: qty 150

## 2020-08-10 NOTE — ED Triage Notes (Signed)
Pt presents via [facility], roommate called 911, pt took 150-200, 500mg  tylenol 10-15 fluoxetine 10-15 clonipin 10-15 hydroxyzine  Unknown dose at this time. EMS states patient has vomited x 1 and has been drowsy

## 2020-08-10 NOTE — ED Provider Notes (Signed)
Please see Dr. Sharen Hones note for full H&P. I called & spoke with poison control to help facilitate care. Spoke with Patty.  Patient initially reported to have ingested:  - 15-20 tablets of seroquel unknown dose  - 100-250 tablets of tylenol 500 mg tablets Discussed VS & EKG.  Recommendations:  Seroquel- observe for:  - CNS & respiratory depression - Hypotension - Tachycarcia - QTc prolongation Tylenol- observe for:  - Liver injury  Management:  - 6 hours on cardiac monitor - Tx tachycardia & hypotension with fluids, if refractory hypotension- norepinephrine.  - QTc prolongation- Check potassium & magnesium- optimize them  - Need 4 hours post ingestion tylenol level - Activated charcoal if safe to do so.  - NAC within 8 hours of overdose.   Recommendations relayed to care team.       Cherly Anderson, PA-C 08/11/20 0249    Palumbo, April, MD 08/11/20 1834

## 2020-08-11 ENCOUNTER — Emergency Department (HOSPITAL_COMMUNITY): Payer: BC Managed Care – PPO

## 2020-08-11 ENCOUNTER — Encounter (HOSPITAL_COMMUNITY): Payer: Self-pay | Admitting: Emergency Medicine

## 2020-08-11 DIAGNOSIS — Z9151 Personal history of suicidal behavior: Secondary | ICD-10-CM | POA: Diagnosis not present

## 2020-08-11 DIAGNOSIS — R4182 Altered mental status, unspecified: Secondary | ICD-10-CM | POA: Diagnosis present

## 2020-08-11 DIAGNOSIS — F332 Major depressive disorder, recurrent severe without psychotic features: Secondary | ICD-10-CM | POA: Diagnosis present

## 2020-08-11 DIAGNOSIS — F419 Anxiety disorder, unspecified: Secondary | ICD-10-CM | POA: Diagnosis present

## 2020-08-11 DIAGNOSIS — Z91018 Allergy to other foods: Secondary | ICD-10-CM | POA: Diagnosis not present

## 2020-08-11 DIAGNOSIS — E876 Hypokalemia: Secondary | ICD-10-CM | POA: Diagnosis present

## 2020-08-11 DIAGNOSIS — T391X4A Poisoning by 4-Aminophenol derivatives, undetermined, initial encounter: Secondary | ICD-10-CM

## 2020-08-11 DIAGNOSIS — J9600 Acute respiratory failure, unspecified whether with hypoxia or hypercapnia: Secondary | ICD-10-CM | POA: Diagnosis present

## 2020-08-11 DIAGNOSIS — T1491XA Suicide attempt, initial encounter: Secondary | ICD-10-CM | POA: Diagnosis not present

## 2020-08-11 DIAGNOSIS — T391X1A Poisoning by 4-Aminophenol derivatives, accidental (unintentional), initial encounter: Secondary | ICD-10-CM | POA: Diagnosis present

## 2020-08-11 DIAGNOSIS — Z79899 Other long term (current) drug therapy: Secondary | ICD-10-CM | POA: Diagnosis not present

## 2020-08-11 DIAGNOSIS — T391X2A Poisoning by 4-Aminophenol derivatives, intentional self-harm, initial encounter: Secondary | ICD-10-CM | POA: Diagnosis present

## 2020-08-11 DIAGNOSIS — F909 Attention-deficit hyperactivity disorder, unspecified type: Secondary | ICD-10-CM | POA: Diagnosis present

## 2020-08-11 DIAGNOSIS — R45851 Suicidal ideations: Secondary | ICD-10-CM | POA: Diagnosis present

## 2020-08-11 DIAGNOSIS — Z818 Family history of other mental and behavioral disorders: Secondary | ICD-10-CM | POA: Diagnosis not present

## 2020-08-11 DIAGNOSIS — Z713 Dietary counseling and surveillance: Secondary | ICD-10-CM | POA: Diagnosis not present

## 2020-08-11 DIAGNOSIS — T391X1D Poisoning by 4-Aminophenol derivatives, accidental (unintentional), subsequent encounter: Secondary | ICD-10-CM | POA: Diagnosis not present

## 2020-08-11 DIAGNOSIS — Z20822 Contact with and (suspected) exposure to covid-19: Secondary | ICD-10-CM | POA: Diagnosis present

## 2020-08-11 DIAGNOSIS — Z978 Presence of other specified devices: Secondary | ICD-10-CM | POA: Diagnosis present

## 2020-08-11 DIAGNOSIS — Z6831 Body mass index (BMI) 31.0-31.9, adult: Secondary | ICD-10-CM | POA: Diagnosis not present

## 2020-08-11 LAB — BLOOD GAS, ARTERIAL
Acid-base deficit: 0.9 mmol/L (ref 0.0–2.0)
Acid-base deficit: 1.9 mmol/L (ref 0.0–2.0)
Bicarbonate: 23.4 mmol/L (ref 20.0–28.0)
Bicarbonate: 23.4 mmol/L (ref 20.0–28.0)
FIO2: 80
FIO2: 50
O2 Saturation: 99.9 %
O2 Saturation: 99.6 %
Patient temperature: 98
Patient temperature: 97.2
pCO2 arterial: 39.4 mmHg (ref 32.0–48.0)
pCO2 arterial: 42.7 mmHg (ref 32.0–48.0)
pH, Arterial: 7.39 (ref 7.350–7.450)
pH, Arterial: 7.354 (ref 7.350–7.450)
pO2, Arterial: 244 mmHg — ABNORMAL HIGH (ref 83.0–108.0)
pO2, Arterial: 207 mmHg — ABNORMAL HIGH (ref 83.0–108.0)

## 2020-08-11 LAB — COMPREHENSIVE METABOLIC PANEL
ALT: 37 U/L (ref 0–44)
ALT: 32 U/L (ref 0–44)
ALT: 29 U/L (ref 0–44)
ALT: 29 U/L (ref 0–44)
AST: 34 U/L (ref 15–41)
AST: 21 U/L (ref 15–41)
AST: 18 U/L (ref 15–41)
AST: 20 U/L (ref 15–41)
Albumin: 4.5 g/dL (ref 3.5–5.0)
Albumin: 3.7 g/dL (ref 3.5–5.0)
Albumin: 3.5 g/dL (ref 3.5–5.0)
Albumin: 3.7 g/dL (ref 3.5–5.0)
Alkaline Phosphatase: 48 U/L (ref 38–126)
Alkaline Phosphatase: 26 U/L — ABNORMAL LOW (ref 38–126)
Alkaline Phosphatase: 23 U/L — ABNORMAL LOW (ref 38–126)
Alkaline Phosphatase: 27 U/L — ABNORMAL LOW (ref 38–126)
Anion gap: 12 (ref 5–15)
Anion gap: 10 (ref 5–15)
Anion gap: 17 — ABNORMAL HIGH (ref 5–15)
Anion gap: 8 (ref 5–15)
BUN: 9 mg/dL (ref 6–20)
BUN: 7 mg/dL (ref 6–20)
BUN: 6 mg/dL (ref 6–20)
BUN: 6 mg/dL (ref 6–20)
CO2: 20 mmol/L — ABNORMAL LOW (ref 22–32)
CO2: 21 mmol/L — ABNORMAL LOW (ref 22–32)
CO2: 21 mmol/L — ABNORMAL LOW (ref 22–32)
CO2: 25 mmol/L (ref 22–32)
Calcium: 9.2 mg/dL (ref 8.9–10.3)
Calcium: 7.9 mg/dL — ABNORMAL LOW (ref 8.9–10.3)
Calcium: 8.1 mg/dL — ABNORMAL LOW (ref 8.9–10.3)
Calcium: 8.6 mg/dL — ABNORMAL LOW (ref 8.9–10.3)
Chloride: 105 mmol/L (ref 98–111)
Chloride: 105 mmol/L (ref 98–111)
Chloride: 108 mmol/L (ref 98–111)
Chloride: 111 mmol/L (ref 98–111)
Creatinine, Ser: 0.93 mg/dL (ref 0.61–1.24)
Creatinine, Ser: 0.62 mg/dL (ref 0.61–1.24)
Creatinine, Ser: 0.65 mg/dL (ref 0.61–1.24)
Creatinine, Ser: 0.76 mg/dL (ref 0.61–1.24)
GFR, Estimated: >60 mL/min (ref >60)
GFR, Estimated: >60 mL/min (ref >60)
GFR, Estimated: >60 mL/min (ref >60)
GFR, Estimated: >60 mL/min (ref >60)
Glucose, Bld: 143 mg/dL — ABNORMAL HIGH (ref 70–99)
Glucose, Bld: 153 mg/dL — ABNORMAL HIGH (ref 70–99)
Glucose, Bld: 245 mg/dL — ABNORMAL HIGH (ref 70–99)
Glucose, Bld: 115 mg/dL — ABNORMAL HIGH (ref 70–99)
Potassium: 2.8 mmol/L — ABNORMAL LOW (ref 3.5–5.1)
Potassium: 3.4 mmol/L — ABNORMAL LOW (ref 3.5–5.1)
Potassium: 3.1 mmol/L — ABNORMAL LOW (ref 3.5–5.1)
Potassium: 3.8 mmol/L (ref 3.5–5.1)
Sodium: 137 mmol/L (ref 135–145)
Sodium: 136 mmol/L (ref 135–145)
Sodium: 146 mmol/L — ABNORMAL HIGH (ref 135–145)
Sodium: 144 mmol/L (ref 135–145)
Total Bilirubin: 0.7 mg/dL (ref 0.3–1.2)
Total Bilirubin: 1 mg/dL (ref 0.3–1.2)
Total Bilirubin: 0.9 mg/dL (ref 0.3–1.2)
Total Bilirubin: 1 mg/dL (ref 0.3–1.2)
Total Protein: 7.8 g/dL (ref 6.5–8.1)
Total Protein: 6.6 g/dL (ref 6.5–8.1)
Total Protein: 6.4 g/dL — ABNORMAL LOW (ref 6.5–8.1)
Total Protein: 6.6 g/dL (ref 6.5–8.1)

## 2020-08-11 LAB — CBC WITH DIFFERENTIAL/PLATELET
Abs Immature Granulocytes: 0.04 10*3/uL (ref 0.00–0.07)
Basophils Absolute: 0 10*3/uL (ref 0.0–0.1)
Basophils Relative: 0 %
Eosinophils Absolute: 0.1 10*3/uL (ref 0.0–0.5)
Eosinophils Relative: 2 %
HCT: 42.6 % (ref 39.0–52.0)
Hemoglobin: 14.2 g/dL (ref 13.0–17.0)
Immature Granulocytes: 1 %
Lymphocytes Relative: 45 %
Lymphs Abs: 3.3 10*3/uL (ref 0.7–4.0)
MCH: 29 pg (ref 26.0–34.0)
MCHC: 33.3 g/dL (ref 30.0–36.0)
MCV: 86.9 fL (ref 80.0–100.0)
Monocytes Absolute: 0.7 10*3/uL (ref 0.1–1.0)
Monocytes Relative: 10 %
Neutro Abs: 3 10*3/uL (ref 1.7–7.7)
Neutrophils Relative %: 42 %
Platelets: 125 10*3/uL — ABNORMAL LOW (ref 150–400)
RBC: 4.9 MIL/uL (ref 4.22–5.81)
RDW: 11.9 % (ref 11.5–15.5)
WBC: 7.3 10*3/uL (ref 4.0–10.5)
nRBC: 0 % (ref 0.0–0.2)

## 2020-08-11 LAB — CBC
HCT: 38.2 % — ABNORMAL LOW (ref 39.0–52.0)
HCT: 37.8 % — ABNORMAL LOW (ref 39.0–52.0)
HCT: 40.7 % (ref 39.0–52.0)
Hemoglobin: 13.1 g/dL (ref 13.0–17.0)
Hemoglobin: 12.9 g/dL — ABNORMAL LOW (ref 13.0–17.0)
Hemoglobin: 13.5 g/dL (ref 13.0–17.0)
MCH: 29.7 pg (ref 26.0–34.0)
MCH: 29.1 pg (ref 26.0–34.0)
MCH: 29.5 pg (ref 26.0–34.0)
MCHC: 34.3 g/dL (ref 30.0–36.0)
MCHC: 34.1 g/dL (ref 30.0–36.0)
MCHC: 33.2 g/dL (ref 30.0–36.0)
MCV: 86.6 fL (ref 80.0–100.0)
MCV: 85.3 fL (ref 80.0–100.0)
MCV: 88.9 fL (ref 80.0–100.0)
Platelets: 125 10*3/uL — ABNORMAL LOW (ref 150–400)
Platelets: 124 10*3/uL — ABNORMAL LOW (ref 150–400)
Platelets: 126 10*3/uL — ABNORMAL LOW (ref 150–400)
RBC: 4.41 MIL/uL (ref 4.22–5.81)
RBC: 4.43 MIL/uL (ref 4.22–5.81)
RBC: 4.58 MIL/uL (ref 4.22–5.81)
RDW: 11.9 % (ref 11.5–15.5)
RDW: 11.9 % (ref 11.5–15.5)
RDW: 12.2 % (ref 11.5–15.5)
WBC: 10.3 10*3/uL (ref 4.0–10.5)
WBC: 9.1 10*3/uL (ref 4.0–10.5)
WBC: 12 10*3/uL — ABNORMAL HIGH (ref 4.0–10.5)
nRBC: 0 % (ref 0.0–0.2)
nRBC: 0 % (ref 0.0–0.2)
nRBC: 0 % (ref 0.0–0.2)

## 2020-08-11 LAB — URINALYSIS, ROUTINE W REFLEX MICROSCOPIC
Bacteria, UA: NONE SEEN
Bilirubin Urine: NEGATIVE
Bilirubin Urine: NEGATIVE
Glucose, UA: 50 mg/dL — AB
Glucose, UA: >=500 mg/dL — AB
Hgb urine dipstick: NEGATIVE
Hgb urine dipstick: NEGATIVE
Ketones, ur: NEGATIVE mg/dL
Ketones, ur: 80 mg/dL — AB
Leukocytes,Ua: NEGATIVE
Leukocytes,Ua: NEGATIVE
Nitrite: NEGATIVE
Nitrite: NEGATIVE
Protein, ur: NEGATIVE mg/dL
Protein, ur: NEGATIVE mg/dL
Specific Gravity, Urine: 1.014 (ref 1.005–1.030)
Specific Gravity, Urine: 1.028 (ref 1.005–1.030)
pH: 7 (ref 5.0–8.0)
pH: 7 (ref 5.0–8.0)

## 2020-08-11 LAB — MAGNESIUM
Magnesium: 1.9 mg/dL (ref 1.7–2.4)
Magnesium: 2 mg/dL (ref 1.7–2.4)
Magnesium: 2.1 mg/dL (ref 1.7–2.4)
Magnesium: 2.4 mg/dL (ref 1.7–2.4)

## 2020-08-11 LAB — PROTIME-INR
INR: 1.2 (ref 0.8–1.2)
INR: 1.9 — ABNORMAL HIGH (ref 0.8–1.2)
Prothrombin Time: 14.5 seconds (ref 11.4–15.2)
Prothrombin Time: 21.2 seconds — ABNORMAL HIGH (ref 11.4–15.2)

## 2020-08-11 LAB — RAPID URINE DRUG SCREEN, HOSP PERFORMED
Amphetamines: NOT DETECTED
Barbiturates: NOT DETECTED
Benzodiazepines: NOT DETECTED
Cocaine: NOT DETECTED
Opiates: NOT DETECTED
Tetrahydrocannabinol: POSITIVE — AB

## 2020-08-11 LAB — RESP PANEL BY RT-PCR (FLU A&B, COVID) ARPGX2
Influenza A by PCR: NEGATIVE
Influenza B by PCR: NEGATIVE
SARS Coronavirus 2 by RT PCR: NEGATIVE

## 2020-08-11 LAB — HIV ANTIBODY (ROUTINE TESTING W REFLEX): HIV Screen 4th Generation wRfx: NONREACTIVE

## 2020-08-11 LAB — MRSA PCR SCREENING: MRSA by PCR: NEGATIVE

## 2020-08-11 LAB — LACTIC ACID, PLASMA: Lactic Acid, Venous: 0.7 mmol/L (ref 0.5–1.9)

## 2020-08-11 LAB — PROCALCITONIN: Procalcitonin: <0.1 ng/mL

## 2020-08-11 LAB — SALICYLATE LEVEL: Salicylate Lvl: <7 mg/dL — ABNORMAL LOW (ref 7.0–30.0)

## 2020-08-11 LAB — PHOSPHORUS
Phosphorus: 2.4 mg/dL — ABNORMAL LOW (ref 2.5–4.6)
Phosphorus: 1.8 mg/dL — ABNORMAL LOW (ref 2.5–4.6)

## 2020-08-11 LAB — ACETAMINOPHEN LEVEL
Acetaminophen (Tylenol), Serum: 108 ug/mL — ABNORMAL HIGH (ref 10–30)
Acetaminophen (Tylenol), Serum: 17 ug/mL (ref 10–30)
Acetaminophen (Tylenol), Serum: 181 ug/mL (ref 10–30)
Acetaminophen (Tylenol), Serum: <10 ug/mL — ABNORMAL LOW (ref 10–30)

## 2020-08-11 LAB — ETHANOL: Alcohol, Ethyl (B): <10 mg/dL (ref <10)

## 2020-08-11 LAB — APTT: aPTT: 27 seconds (ref 24–36)

## 2020-08-11 MED ORDER — FENTANYL 2500MCG IN NS 250ML (10MCG/ML) PREMIX INFUSION
0.0000 ug/h | INTRAVENOUS | Status: DC
  Administered 2020-08-11: 25 ug/h via INTRAVENOUS
  Filled 2020-08-11: qty 250

## 2020-08-11 MED ORDER — POTASSIUM & SODIUM PHOSPHATES 280-160-250 MG PO PACK: 1.0000 | PACK | Freq: Two times a day (BID) | ORAL | Status: DC

## 2020-08-11 MED ORDER — POTASSIUM CHLORIDE 10 MEQ/100ML IV SOLN
10.0000 meq | INTRAVENOUS | Status: AC
Start: 2020-08-11 — End: 2020-08-11
  Administered 2020-08-11 (×4): 10 meq via INTRAVENOUS
  Filled 2020-08-11 (×4): qty 100

## 2020-08-11 MED ORDER — PROPOFOL 500 MG/50ML IV EMUL
INTRAVENOUS | Status: AC
  Filled 2020-08-11: qty 50

## 2020-08-11 MED ORDER — POTASSIUM & SODIUM PHOSPHATES 280-160-250 MG PO PACK
1.0000 | PACK | ORAL | Status: DC
  Administered 2020-08-11: 1 via ORAL
  Filled 2020-08-11 (×2): qty 1

## 2020-08-11 MED ORDER — LIP MEDEX EX OINT
TOPICAL_OINTMENT | CUTANEOUS | Status: DC | PRN
  Administered 2020-08-11: 1 via TOPICAL
  Filled 2020-08-11: qty 7

## 2020-08-11 MED ORDER — POTASSIUM CHLORIDE 20 MEQ PO PACK
80.0000 meq | PACK | Freq: Two times a day (BID) | ORAL | Status: DC
  Administered 2020-08-11 (×3): 80 meq via ORAL
  Filled 2020-08-11 (×4): qty 4

## 2020-08-11 MED ORDER — DEXMEDETOMIDINE HCL IN NACL 200 MCG/50ML IV SOLN
0.4000 ug/kg/h | INTRAVENOUS | Status: DC
  Administered 2020-08-11 (×2): 0.7 ug/kg/h via INTRAVENOUS
  Administered 2020-08-11: 0.5 ug/kg/h via INTRAVENOUS
  Administered 2020-08-11: 0.4 ug/kg/h via INTRAVENOUS
  Administered 2020-08-11: 0.5 ug/kg/h via INTRAVENOUS
  Filled 2020-08-11 (×5): qty 50

## 2020-08-11 MED ORDER — ROCURONIUM BROMIDE 10 MG/ML (PF) SYRINGE
PREFILLED_SYRINGE | INTRAVENOUS | Status: AC
  Administered 2020-08-11: 100 mg
  Filled 2020-08-11: qty 10

## 2020-08-11 MED ORDER — POTASSIUM CHLORIDE IN NACL 40-0.9 MEQ/L-% IV SOLN
INTRAVENOUS | Status: DC
  Filled 2020-08-11 (×3): qty 1000

## 2020-08-11 MED ORDER — FAMOTIDINE IN NACL 20-0.9 MG/50ML-% IV SOLN
20.0000 mg | Freq: Two times a day (BID) | INTRAVENOUS | Status: DC
  Administered 2020-08-11 – 2020-08-13 (×6): 20 mg via INTRAVENOUS
  Filled 2020-08-11 (×6): qty 50

## 2020-08-11 MED ORDER — ETOMIDATE 2 MG/ML IV SOLN
INTRAVENOUS | Status: AC
  Administered 2020-08-11: 20 mg via INTRAVENOUS
  Filled 2020-08-11: qty 20

## 2020-08-11 MED ORDER — MIDAZOLAM HCL 2 MG/2ML IJ SOLN
INTRAMUSCULAR | Status: AC
  Filled 2020-08-11: qty 2

## 2020-08-11 MED ORDER — CHLORHEXIDINE GLUCONATE 0.12% ORAL RINSE (MEDLINE KIT)
15.0000 mL | Freq: Two times a day (BID) | OROMUCOSAL | Status: DC
  Administered 2020-08-11 – 2020-08-12 (×4): 15 mL via OROMUCOSAL

## 2020-08-11 MED ORDER — HEPARIN SODIUM (PORCINE) 5000 UNIT/ML IJ SOLN
5000.0000 [IU] | Freq: Three times a day (TID) | INTRAMUSCULAR | Status: DC
  Administered 2020-08-11 – 2020-08-13 (×7): 5000 [IU] via SUBCUTANEOUS
  Filled 2020-08-11 (×7): qty 1

## 2020-08-11 MED ORDER — MAGNESIUM SULFATE IN D5W 1-5 GM/100ML-% IV SOLN
1.0000 g | Freq: Once | INTRAVENOUS | Status: AC
  Administered 2020-08-11: 1 g via INTRAVENOUS
  Filled 2020-08-11: qty 100

## 2020-08-11 MED ORDER — POTASSIUM & SODIUM PHOSPHATES 280-160-250 MG PO PACK
1.0000 | PACK | ORAL | Status: AC
  Administered 2020-08-11: 1 via NASOGASTRIC
  Filled 2020-08-11: qty 1

## 2020-08-11 MED ORDER — ETOMIDATE 2 MG/ML IV SOLN: 20.0000 mg | Freq: Once | INTRAVENOUS | Status: AC

## 2020-08-11 MED ORDER — CHLORHEXIDINE GLUCONATE CLOTH 2 % EX PADS
6.0000 | MEDICATED_PAD | Freq: Every day | CUTANEOUS | Status: DC
  Administered 2020-08-11 – 2020-08-13 (×2): 6 via TOPICAL

## 2020-08-11 MED ORDER — LIDOCAINE HCL (CARDIAC) PF 100 MG/5ML IV SOSY
PREFILLED_SYRINGE | INTRAVENOUS | Status: AC
  Filled 2020-08-11: qty 5

## 2020-08-11 MED ORDER — DOCUSATE SODIUM 100 MG PO CAPS: 100.0000 mg | ORAL_CAPSULE | Freq: Two times a day (BID) | ORAL | Status: DC | PRN

## 2020-08-11 MED ORDER — ORAL CARE MOUTH RINSE
15.0000 mL | OROMUCOSAL | Status: DC
  Administered 2020-08-11 – 2020-08-12 (×11): 15 mL via OROMUCOSAL

## 2020-08-11 MED ORDER — ONDANSETRON HCL 4 MG/2ML IJ SOLN
4.0000 mg | Freq: Once | INTRAMUSCULAR | Status: AC
  Administered 2020-08-11: 4 mg via INTRAVENOUS
  Filled 2020-08-11: qty 2

## 2020-08-11 MED ORDER — POTASSIUM PHOSPHATES 15 MMOLE/5ML IV SOLN
10.0000 mmol | Freq: Once | INTRAVENOUS | Status: AC
  Administered 2020-08-11: 10 mmol via INTRAVENOUS
  Filled 2020-08-11: qty 3.33

## 2020-08-11 MED ORDER — SUCCINYLCHOLINE CHLORIDE 200 MG/10ML IV SOSY
PREFILLED_SYRINGE | INTRAVENOUS | Status: AC
  Filled 2020-08-11: qty 10

## 2020-08-11 MED ORDER — ROCURONIUM BROMIDE 50 MG/5ML IV SOLN
100.0000 mg | Freq: Once | INTRAVENOUS | Status: DC
  Filled 2020-08-11: qty 10

## 2020-08-11 MED ORDER — VECURONIUM BROMIDE 10 MG IV SOLR
INTRAVENOUS | Status: AC
  Filled 2020-08-11: qty 10

## 2020-08-11 MED ORDER — POLYETHYLENE GLYCOL 3350 17 G PO PACK: 17.0000 g | PACK | Freq: Every day | ORAL | Status: DC | PRN

## 2020-08-11 MED ORDER — STERILE WATER FOR INJECTION IJ SOLN
INTRAMUSCULAR | Status: AC
  Filled 2020-08-11: qty 10

## 2020-08-11 MED ORDER — FENTANYL CITRATE (PF) 100 MCG/2ML IJ SOLN
INTRAMUSCULAR | Status: AC
  Filled 2020-08-11: qty 2

## 2020-08-11 NOTE — Progress Notes (Signed)
eLink Physician-Brief Progress Note Patient Name: Johnny Delgado DOB: Mar 29, 1996 MRN: [id number]   Date of Service  08/11/2020  HPI/Events of Note  Patient request for Carmex lip balm.   eICU Interventions  Carmex lip balm to lips PRN.      Intervention Category Major Interventions: Other:  Lenell Antu 08/11/2020, 11:16 PM

## 2020-08-11 NOTE — Progress Notes (Signed)
Initial Nutrition Assessment  RD working remotely.  DOCUMENTATION CODES:   Not applicable  INTERVENTION:  - if patient to remain intubated >/= 24 hours, recommend TF initiation via NGT. - TF recs: Vital 1.5 @ 35 ml/hr to advance by 10 ml every 4 hours to reach goal rate of 55 ml/hr with 90 ml Prosource TF BID. - at goal rate, this regimen will provide 2140 kcal, 133 grams protein, and 1008 ml free water.    NUTRITION DIAGNOSIS:   Inadequate oral intake related to inability to eat as evidenced by NPO status.  GOAL:   Patient will meet greater than or equal to 90% of their needs  MONITOR:   Vent status,Labs,Weight trends  REASON FOR ASSESSMENT:   Ventilator  ASSESSMENT:   25 y.o. male who presented to the ED via EMS due to suspected drug OD. EMS was called by the patient's roommate. He was intubated in the ED due to AMS and for airway protection. He received activated charcoal in the ED.  Patient remains intubated with NGT in place; abdominal xray report from 3/8 states tip of tube is distal to GE junction (gastric placement).   He has not been seen by a [place] RD at any time in the past.   Weight today is 211 lb and PTA the most recently documented weight was on 07/14/19 when he weighed 184 lb. No information documented in the edema section of flow sheet.  Per notes: - drug OD - mild hypokalemia and mild hypophosphatemia--repletion ordered    Patient is currently intubated on ventilator support MV: 10.3 L/min Temp (24hrs), Avg:97.8 F (36.6 C), Min:96.8 F (36 C), Max:98.5 F (36.9 C) Propofol: none  Labs reviewed; K: 3.4 mmol/l, Ca: 7.9 mg/dl, Phos: 2.4 mg/dl. Medications reviewed; 20 mg IV pepcid BID, 1 g IV Mg sulfate x1 dose 3/9, 1 packet Phosnak x1 dose 3/9, 80 mEq Klor-Con BID, 10 mEq IV KCl x4 runs 3/9  Drips; precedex @ 0.7 mcg/kg/hr, fentanyl @ 125 mcg/hr.  IVF; NS-40 mEq KCl @ 75 ml/hr.   NUTRITION - FOCUSED PHYSICAL EXAM:  unable to complete  at this time.   Diet Order:   Diet Order            Diet NPO time specified  Diet effective now                 EDUCATION NEEDS:   No education needs have been identified at this time  Skin:  Skin Assessment: Reviewed RN Assessment  Last BM:  PTA/unknown  Height:   Ht Readings from Last 1 Encounters:  08/11/20 5\' 10"  (1.778 m)    Weight:   Wt Readings from Last 1 Encounters:  08/11/20 95.8 kg    Estimated Nutritional Needs:  Kcal:  2151 kcal Protein:  115-130 grams Fluid:  >/= 2.3 L/day      2152, MS, RD, LDN, CNSC Inpatient Clinical Dietitian RD pager # available in [facility]  After hours/weekend pager # available in [facility]

## 2020-08-11 NOTE — H&P (Signed)
NAME:  Johnny Delgado MRN:  [id number] DOB:  01/02/1996 LOS: 0 ADMISSION DATE:  08/10/2020 DATE OF SERVICE:  08/11/2020  CHIEF COMPLAINT:  Acetaminophen overdose   HISTORY & PHYSICAL  History of Present Illness  This 25 y.o. Caucasian male presented to [facility] via EMS with complaints of suspected drug overdose.  EMS was called by the patient's roommate.  The patient is suspected to have taken as much as #150-200 Tylenol (500 mg tablets), #10-15 fluoxetine, #10-15 Klonopin, #10-15 hydroxyzine.  The patient had one bout of emesis en route to the hospital, which was suspected to have contained pill fragments.  He was intubated in the ER for altered mental status (drowsy) and airway protection.  He was given activated charcoal in the ER, which reportedly resulted in almost immediate emesis; no pill contents were observed.  At the time of clinical encounter, the patient is intubated.  REVIEW OF SYSTEMS This patient is critically ill and cannot provide additional history nor review of systems due to mental status/unconsciousness and endotracheally intubated.   Past Medical/Surgical/Social/Family History   Past Medical History:  Diagnosis Date  . ADHD (attention deficit hyperactivity disorder)   . Anxiety   . Depression     Past Surgical History:  Procedure Laterality Date  . WISDOM TOOTH EXTRACTION Bilateral 2015   All 4 extracted    Social History   Tobacco Use  . Smoking status: Never Smoker  . Smokeless tobacco: Never Used  Substance Use Topics  . Alcohol use: No    Family History  Problem Relation Age of Onset  . Depression Mother   . Drug abuse Mother   . Depression Father   . Drug abuse Father   . ADD / ADHD Brother   . Drug abuse Paternal Grandmother      Procedures:  3/9: endotracheal intubation in ER   Significant Diagnostic Tests:     Micro Data:   Results for orders placed or performed during the hospital encounter of 08/10/20   Resp Panel by RT-PCR (Flu A&B, Covid) Nasopharyngeal Swab     Status: None   Collection Time: 08/10/20 11:53 PM   Specimen: Nasopharyngeal Swab; Nasopharyngeal(NP) swabs in vial transport medium  Result Value Ref Range Status   SARS Coronavirus 2 by RT PCR NEGATIVE NEGATIVE Final    Comment: (NOTE) SARS-CoV-2 target nucleic acids are NOT DETECTED.  The SARS-CoV-2 RNA is generally detectable in upper respiratory specimens during the acute phase of infection. The lowest concentration of SARS-CoV-2 viral copies this assay can detect is 138 copies/mL. A negative result does not preclude SARS-Cov-2 infection and should not be used as the sole basis for treatment or other patient management decisions. A negative result may occur with  improper specimen collection/handling, submission of specimen other than nasopharyngeal swab, presence of viral mutation(s) within the areas targeted by this assay, and inadequate number of viral copies(<138 copies/mL). A negative result must be combined with clinical observations, patient history, and epidemiological information. The expected result is Negative.  Fact Sheet for Patients:  BloggerCourse.comhttps://www.fda.gov/media/152166/download  Fact Sheet for Healthcare Providers:  SeriousBroker.ithttps://www.fda.gov/media/152162/download  This test is no t yet approved or cleared by the [place] FDA and  has been authorized for detection and/or diagnosis of SARS-CoV-2 by FDA under an Emergency Use Authorization (EUA). This EUA will remain  in effect (meaning this test can be used) for the duration of the COVID-19 declaration under Section 564(b)(1) of the Act, 21 U.S.C.section 360bbb-3(b)(1), unless the authorization  is terminated  or revoked sooner.       Influenza A by PCR NEGATIVE NEGATIVE Final   Influenza B by PCR NEGATIVE NEGATIVE Final    Comment: (NOTE) The Xpert Xpress SARS-CoV-2/FLU/RSV plus assay is intended as an aid in the diagnosis of influenza from  Nasopharyngeal swab specimens and should not be used as a sole basis for treatment. Nasal washings and aspirates are unacceptable for Xpert Xpress SARS-CoV-2/FLU/RSV testing.  Fact Sheet for Patients: BloggerCourse.com  Fact Sheet for Healthcare Providers: SeriousBroker.it  This test is not yet approved or cleared by the [place] FDA and has been authorized for detection and/or diagnosis of SARS-CoV-2 by FDA under an Emergency Use Authorization (EUA). This EUA will remain in effect (meaning this test can be used) for the duration of the COVID-19 declaration under Section 564(b)(1) of the Act, 21 U.S.C. section 360bbb-3(b)(1), unless the authorization is terminated or revoked.  Performed at [facility], [address], [place], [place] [address]       Antimicrobials:      Interim history/subjective:     Objective   BP (!) 149/77   Pulse (!) 129   Temp 98 F (36.7 C)   Resp 18   Ht 5\' 10"  (1.778 m)   Wt 99.8 kg   SpO2 100%   BMI 31.57 kg/m     Filed Weights   08/10/20 2333  Weight: 99.8 kg    Intake/Output Summary (Last 24 hours) at 08/11/2020 0121 Last data filed at 08/11/2020 0000 Gross per 24 hour  Intake 1000 ml  Output --  Net 1000 ml    Vent Mode: PRVC FiO2 (%):  [80 %] 80 % Set Rate:  [18 bmp] 18 bmp Vt Set:  [580 mL] 580 mL PEEP:  [5 cmH20] 5 cmH20 Plateau Pressure:  [17 cmH20] 17 cmH20   Examination: GENERAL: Intubated. lethargic/drowsy or arousable to pain. No acute distress. HEAD: normocephalic, atraumatic EYE: PERRLA, EOM intact, no scleral icterus, no pallor. NOSE: nares are patent. No polyps. No exudate. Charcoal in the nasal passages. THROAT/ORAL CAVITY: Normal dentition. No oral thrush. No exudate. Mucous membranes are moist. No tonsillar enlargement.  ETT in situ. Charcoal observed. NECK: supple, no thyromegaly, no JVD, no lymphadenopathy. Trachea  midline. CHEST/LUNG: symmetric in development and expansion. Good air entry. No crackles. No wheezes. HEART: Regular S1 and S2 without murmur, rub or gallop. ABDOMEN: soft, nontender, nondistended. Normoactive bowel sounds. No rebound. No guarding. No hepatosplenomegaly. EXTREMITIES: Edema: none. No cyanosis. No clubbing. 2+ DP pulses LYMPHATIC: no cervical/axillary/inguinal lymph nodes appreciated MUSCULOSKELETAL: No point tenderness. No bulk atrophy. Joints: normal inspection.  SKIN:  Multiple tatoos. NEUROLOGIC: Doll's eyes intact. Corneal reflex intact. Synchronous with ventilator. Cranial nerves II-XII are grossly symmetric and physiologic. B Babinski. No sensory deficit. Moves all 4 extremities to noxious stimuli.   Resolved Hospital Problem list      Assessment & Plan:   ASSESSMENT/PLAN:  ASSESSMENT (included in the Hospital Problem List)  Active Problems:   Severe recurrent major depression without psychotic features (HCC)   Acetaminophen overdose   Hypokalemia   Endotracheally intubated   By systems: NEUROPSYCHIATRIC Major depression Drug overdose: acetaminophen +/- Prozac +/- hydroxyzine  Notably, UDS was negative for benzodiazepines.  Initial Tylenol level was 180, believed to have been drawn 1 hour after ingestion  Continue NAC per protocol.  Continue bicarbonate infusion for now  Monitor QTc  Psychiatry consultation will be needed prior to final disposition for this hospitalization.  PULMONARY  Endotracheally intubated  Check ABG  Titrate vent settings based on ABG results  Precedex/fentanyl for sedation  Titrate for RASS -2   CARDIOVASCULAR:  Prolonged QTc  Cardiac monitoring  Repeat EKG in am   RENAL:  Hypokalemia  Replete potassium  Monitor urine output  Monitor serum creatinine   GASTROINTESTINAL: No acute issues  GI PROPHYLAXIS: famotidine   HEMATOLOGIC: No acute issues  DVT PROPHYLAXIS: heparin   INFECTIOUS: No acute  issues   ENDOCRINE: No acute issues   PLAN/RECOMMENDATIONS   Admit to ICU under my service (Attending: Marcelle Smiling, MD) with the diagnoses highlighted above in the active Hospital Problem List (ASSESSMENT).  See above.    My assessment, plan of care, findings, medications, side effects, etc. were discussed with: nurse and Dr. Nicanor Alcon (Emergency Medicine).   Best practice:  Diet: NPO Pain/Anxiety/Delirium protocol (if indicated): Precedex/fentanyl VAP protocol (if indicated): YES DVT prophylaxis: heparin GI prophylaxis: famotidine Glucose control: N/A Mobility/Activity: Bedrest   Code Status: Full Code Family Communication:  no family at bedside Disposition:    Labs   CBC: Recent Labs  Lab 08/10/20 2324  WBC 7.3  NEUTROABS 3.0  HGB 14.2  HCT 42.6  MCV 86.9  PLT 125*    Basic Metabolic Panel: Recent Labs  Lab 08/10/20 2324 08/10/20 2330  NA 137  --   K 2.8*  --   CL 105  --   CO2 20*  --   GLUCOSE 143*  --   BUN 9  --   CREATININE 0.93  --   CALCIUM 9.2  --   MG  --  2.0   GFR: Estimated Creatinine Clearance: 146.3 mL/min (by C-G formula based on SCr of 0.93 mg/dL). Recent Labs  Lab 08/10/20 2324  WBC 7.3    Liver Function Tests: Recent Labs  Lab 08/10/20 2324  AST 34  ALT 37  ALKPHOS 48  BILITOT 0.7  PROT 7.8  ALBUMIN 4.5   No results for input(s): LIPASE, AMYLASE in the last 168 hours. No results for input(s): AMMONIA in the last 168 hours.  ABG No results found for: PHART, PCO2ART, PO2ART, HCO3, TCO2, ACIDBASEDEF, O2SAT   Coagulation Profile: No results for input(s): INR, PROTIME in the last 168 hours.  Cardiac Enzymes: No results for input(s): CKTOTAL, CKMB, CKMBINDEX, TROPONINI in the last 168 hours.  HbA1C: Hgb A1c MFr Bld  Date/Time Value Ref Range Status  07/21/2017 06:13 AM 5.7 (H) 4.8 - 5.6 % Final    Comment:    (NOTE) Pre diabetes:          5.7%-6.4% Diabetes:              >6.4% Glycemic control for    <7.0% adults with diabetes     CBG: Recent Labs  Lab 08/10/20 2332  GLUCAP 134*     Past Medical History   Past Medical History:  Diagnosis Date  . ADHD (attention deficit hyperactivity disorder)   . Anxiety   . Depression       Surgical History    Past Surgical History:  Procedure Laterality Date  . WISDOM TOOTH EXTRACTION Bilateral 2015   All 4 extracted      Social History   Social History   Socioeconomic History  . Marital status: Single    Spouse name: Not on file  . Number of children: Not on file  . Years of education: Not on file  . Highest education level: Not on file  Occupational History  .  Not on file  Tobacco Use  . Smoking status: Never Smoker  . Smokeless tobacco: Never Used  Vaping Use  . Vaping Use: Some days  Substance and Sexual Activity  . Alcohol use: No  . Drug use: No  . Sexual activity: Never  Other Topics Concern  . Not on file  Social History Narrative  . Not on file   Social Determinants of Health   Financial Resource Strain: Not on file  Food Insecurity: Not on file  Transportation Needs: Not on file  Physical Activity: Not on file  Stress: Not on file  Social Connections: Not on file      Family History    Family History  Problem Relation Age of Onset  . Depression Mother   . Drug abuse Mother   . Depression Father   . Drug abuse Father   . ADD / ADHD Brother   . Drug abuse Paternal Grandmother    family history includes ADD / ADHD in his brother; Depression in his father and mother; Drug abuse in his father, mother, and paternal grandmother.    Allergies Allergies  Allergen Reactions  . Banana Diarrhea      Current Medications  Current Facility-Administered Medications:  .  [COMPLETED] sodium chloride 0.9 % bolus 1,000 mL, 1,000 mL, Intravenous, Once, Stopped at 08/11/20 0000 **AND** 0.9 %  sodium chloride infusion, , Intravenous, Continuous, Palumbo, April, MD, Last Rate: 125 mL/hr at 08/10/20  2342, New Bag at 08/10/20 2342 .  [COMPLETED] acetylcysteine (ACETADOTE) 40 mg/mL load via infusion 14,970 mg, 150 mg/kg, Intravenous, Once, 14,970 mg at 08/11/20 0014 **FOLLOWED BY** acetylcysteine (ACETADOTE) 24,000 mg in dextrose 5 % 600 mL (40 mg/mL) infusion, 15 mg/kg/hr, Intravenous, Continuous, Palumbo, April, MD, Last Rate: 37.4 mL/hr at 08/11/20 0014, 15 mg/kg/hr at 08/11/20 0014 .  fentaNYL (SUBLIMAZE) 100 MCG/2ML injection, , , ,  .  fentaNYL in NS (61mcg/ml) infusion-PREMIX, 0-400 mcg/hr, Intravenous, Continuous, Palumbo, April, MD .  lidocaine (cardiac) 100 mg/12mL (XYLOCAINE) 100 MG/5ML injection 2%, , , ,  .  midazolam (VERSED) 2 MG/2ML injection, , , ,  .  potassium chloride (KLOR-CON) packet 80 mEq, 80 mEq, Oral, BID, Palumbo, April, MD, 80 mEq at 08/11/20 0105 .  potassium chloride 10 mEq in 100 mL IVPB, 10 mEq, Intravenous, Q1 Hr x 4, Palumbo, April, MD, Last Rate: 100 mL/hr at 08/11/20 0059, 10 mEq at 08/11/20 0059 .  propofol (DIPRIVAN) 500 MG/50ML infusion, , , ,  .  rocuronium (ZEMURON) injection 100 mg, 100 mg, Intravenous, Once, Palumbo, April, MD .  sodium bicarbonate 150 mEq in dextrose 5 % 1,000 mL infusion, , Intravenous, Continuous, Palumbo, April, MD, Last Rate: 150 mL/hr at 08/10/20 2354, New Bag at 08/10/20 2354 .  sterile water (preservative free) injection, , , ,  .  succinylcholine (ANECTINE) 200 MG/10ML syringe, , , ,  .  vecuronium (NORCURON) 10 MG injection, , , ,   Current Outpatient Medications:  .  loperamide (IMODIUM) 2 MG capsule, Take 2 capsules (4 mg total) by mouth as needed for diarrhea or loose stools., Disp: 30 capsule, Rfl: 0 .  ondansetron (ZOFRAN) 4 MG tablet, Take 1 tablet (4 mg total) by mouth every 6 (six) hours as needed for nausea or vomiting., Disp: 12 tablet, Rfl: 0   Home Medications  Prior to Admission medications   Medication Sig Start Date End Date Taking? Authorizing Provider  loperamide (IMODIUM) 2 MG capsule Take 2  capsules (4 mg total)  by mouth as needed for diarrhea or loose stools. 07/15/19   Ghimire, Werner Lean, MD  ondansetron (ZOFRAN) 4 MG tablet Take 1 tablet (4 mg total) by mouth every 6 (six) hours as needed for nausea or vomiting. 07/15/19   Ghimire, Werner Lean, MD      Critical care time: 45 minutes.  The treatment and management of the patient's condition was required based on the threat of imminent deterioration. This time reflects time spent by the physician evaluating, providing care and managing the critically ill patient's care. The time was spent at the immediate bedside (or on the same floor/unit and dedicated to this patient's care). Time involved in separately billable procedures is NOT included int he critical care time indicated above. Family meeting and update time may be included above if and only if the patient is unable/incompetent to participate in clinical interview and/or decision making, and the discussion was necessary to determining treatment decisions.   Marcelle Smiling, MD Board Certified by the ABIM, Pulmonary Diseases & Critical Care Medicine

## 2020-08-11 NOTE — Progress Notes (Signed)
I obtained photos of prescription bottles that were at the patient's residence. I have entered them on the PTA med list for reference but PLEASE NOTE, per [facility] all three were filled > 18 months ago.  Please contact pharmacy if we can be of further assistance,  Charolotte Eke, PharmD Admin. Coordinator of Transitions of Care 08/11/2020,10:17 AM

## 2020-08-11 NOTE — Progress Notes (Signed)
Brief pharmacy note:  2048 APAP level <10 LFTs WNL Vitals WNL  Per Angelique Blonder at poison control may discontinue acetylcysteine drip, d/w MD  Arley Phenix RPh 08/11/2020, 11:25 PM

## 2020-08-11 NOTE — Progress Notes (Signed)
Chaplain engaged in an initial visit with Johnny Delgado and his mom, Johnny Delgado.  Chaplain learned about Johnny Delgado's health journey and what has brought him to the hospital.  Johnny Delgado shared about how hard it has been for her son recently, which includes trouble affording his medication.  Johnny Delgado, per his mom, has also struggled with depression for the last 3-4 years.    Johnny Delgado is the middle child of three children.  He has a brother and sister, and they are all very close. He has a roommate who works in the [employer] and who was also able to provide speedy assistance to him when he recognized something was wrong. Johnny Delgado stated that she talks to [place] everyday, providing help as he needs it.  Johnny Delgado stated that Johnny Delgado grew up in a [place] household, with his father, Johnny Delgado, serving as a Education officer, environmental.    Johnny Delgado shared that her faith and family are getting her through at this time.  She is able to verbalize the plan of care and action happening for Johnny Delgado, and is hopeful that he will progress.   Chaplain and Johnny Delgado spent time discussing faith, the pandemic, and mental healthcare.  Chaplain offered ministries of presence, listening and prayer.     08/11/20 1200  Clinical Encounter Type  Visited With Patient and family together  Visit Type Initial;Spiritual support

## 2020-08-11 NOTE — Plan of Care (Signed)
  Problem: Education: Goal: Knowledge of General Education information will improve Description: Including pain rating scale, medication(s)/side effects and non-pharmacologic comfort measures 08/11/2020 0537 by Hart Carwin, RN Outcome: Progressing 08/11/2020 0536 by Hart Carwin, RN Outcome: Progressing   Problem: Health Behavior/Discharge Planning: Goal: Ability to manage health-related needs will improve 08/11/2020 0537 by Hart Carwin, RN Outcome: Progressing 08/11/2020 0536 by Hart Carwin, RN Outcome: Progressing   Problem: Clinical Measurements: Goal: Ability to maintain clinical measurements within normal limits will improve 08/11/2020 0537 by Hart Carwin, RN Outcome: Progressing 08/11/2020 0536 by Hart Carwin, RN Outcome: Progressing Goal: Will remain free from infection 08/11/2020 0537 by Hart Carwin, RN Outcome: Progressing 08/11/2020 0536 by Hart Carwin, RN Outcome: Progressing Goal: Diagnostic test results will improve 08/11/2020 0537 by Hart Carwin, RN Outcome: Progressing 08/11/2020 0536 by Hart Carwin, RN Outcome: Progressing Goal: Respiratory complications will improve 08/11/2020 0537 by Hart Carwin, RN Outcome: Progressing 08/11/2020 0536 by Hart Carwin, RN Outcome: Progressing Goal: Cardiovascular complication will be avoided 08/11/2020 0537 by Hart Carwin, RN Outcome: Progressing 08/11/2020 0536 by Hart Carwin, RN Outcome: Progressing   Problem: Activity: Goal: Risk for activity intolerance will decrease 08/11/2020 0537 by Hart Carwin, RN Outcome: Progressing 08/11/2020 0536 by Hart Carwin, RN Outcome: Progressing   Problem: Nutrition: Goal: Adequate nutrition will be maintained 08/11/2020 0537 by Hart Carwin, RN Outcome: Progressing 08/11/2020 0536 by Hart Carwin, RN Outcome: Progressing   Problem: Coping: Goal: Level of anxiety will decrease 08/11/2020 0537 by Hart Carwin, RN Outcome:  Progressing 08/11/2020 0536 by Hart Carwin, RN Outcome: Progressing   Problem: Elimination: Goal: Will not experience complications related to bowel motility 08/11/2020 0537 by Hart Carwin, RN Outcome: Progressing 08/11/2020 0536 by Hart Carwin, RN Outcome: Progressing Goal: Will not experience complications related to urinary retention 08/11/2020 0537 by Hart Carwin, RN Outcome: Progressing 08/11/2020 0536 by Hart Carwin, RN Outcome: Progressing   Problem: Pain Managment: Goal: General experience of comfort will improve 08/11/2020 0537 by Hart Carwin, RN Outcome: Progressing 08/11/2020 0536 by Hart Carwin, RN Outcome: Progressing   Problem: Safety: Goal: Ability to remain free from injury will improve 08/11/2020 0537 by Hart Carwin, RN Outcome: Progressing 08/11/2020 0536 by Hart Carwin, RN Outcome: Progressing   Problem: Skin Integrity: Goal: Risk for impaired skin integrity will decrease 08/11/2020 0537 by Hart Carwin, RN Outcome: Progressing 08/11/2020 0536 by Hart Carwin, RN Outcome: Progressing   Problem: Activity: Goal: Ability to tolerate increased activity will improve 08/11/2020 0537 by Hart Carwin, RN Outcome: Progressing 08/11/2020 0536 by Hart Carwin, RN Outcome: Progressing   Problem: Respiratory: Goal: Ability to maintain a clear airway and adequate ventilation will improve 08/11/2020 0537 by Hart Carwin, RN Outcome: Progressing 08/11/2020 0536 by Hart Carwin, RN Outcome: Progressing   Problem: Role Relationship: Goal: Method of communication will improve 08/11/2020 0537 by Hart Carwin, RN Outcome: Progressing 08/11/2020 0536 by Hart Carwin, RN Outcome: Progressing

## 2020-08-11 NOTE — Progress Notes (Signed)
Mother, Misty Stanley, updated via phone on plan of care for 3/9   Canary Brim, MSN, APRN, NP-C, AGACNP-BC [facility] 08/11/2020, 11:16 AM   Please see [email or web address] for pager details.   From 7A-7P if no response, please call [phone] After hours, please call ELink [phone]

## 2020-08-11 NOTE — Procedures (Signed)
Extubation Procedure Note  Patient Details:   Name: UNKNOWN SCHLEYER DOB: 1996/01/28 MRN: [id number]   Airway Documentation:    Vent end date: 08/11/20 Vent end time: 1815   Evaluation  O2 sats: stable throughout Complications: No apparent complications Patient did tolerate procedure well. Bilateral Breath Sounds: Clear   Pt on Ra  Renold Genta 08/11/2020, 6:18 PM

## 2020-08-11 NOTE — TOC Initial Note (Addendum)
Transition of Care [facility]) - Initial/Assessment Note    Patient Details  Name: Johnny Delgado MRN: [id number] Date of Birth: 01-16-1996  Transition of Care [facility]) CM/SW Contact:    Golda Acre, RN Phone Number: 08/11/2020, 8:02 AM  Clinical Narrative:                 25 y.o. Caucasian male presented to [facility] via EMS with complaints of suspected drug overdose.  EMS was called by the patient's roommate.  The patient is suspected to have taken as much as #150-200 Tylenol (500 mg tablets), #10-15 fluoxetine, #10-15 Klonopin, #10-15 hydroxyzine.  The patient had one bout of emesis en route to the hospital, which was suspected to have contained pill fragments.  He was intubated in the ER for altered mental status (drowsy) and airway protection.  He was given activated charcoal in the ER, which reportedly resulted in almost immediate emesis; no pill contents were observed.  At the time of clinical encounter, the patient is intubated. PLAN: to follow for progression and toc needs.  May need inpatient pysch Expected Discharge Plan: Home/Self Care Barriers to Discharge: Continued Medical Work up ivc papers given to unit secetary  Patient Goals and CMS Choice Patient states their goals for this hospitalization and ongoing recovery are:: unable to state on vent      Expected Discharge Plan and Services Expected Discharge Plan: Home/Self Care   Discharge Planning Services: CM Consult   Living arrangements for the past 2 months: Apartment                                      Prior Living Arrangements/Services Living arrangements for the past 2 months: Apartment Lives with:: Self Patient language and need for interpreter reviewed:: Yes        Need for Family Participation in Patient Care: Yes (Comment) Care giver support system in place?: Yes (comment)   Criminal Activity/Legal Involvement Pertinent to Current Situation/Hospitalization: No - Comment as  needed  Activities of Daily Living Home Assistive Devices/Equipment: Eyeglasses ADL Screening (condition at time of admission) Patient's cognitive ability adequate to safely complete daily activities?: Yes Is the patient deaf or have difficulty hearing?: No Does the patient have difficulty seeing, even when wearing glasses/contacts?: No Does the patient have difficulty concentrating, remembering, or making decisions?: No Patient able to express need for assistance with ADLs?: Yes Does the patient have difficulty dressing or bathing?: No Independently performs ADLs?: Yes (appropriate for developmental age) Does the patient have difficulty walking or climbing stairs?: No Weakness of Legs: None Weakness of Arms/Hands: None  Permission Sought/Granted                  Emotional Assessment Appearance:: Appears stated age Attitude/Demeanor/Rapport: Unable to Assess Affect (typically observed): Unable to Assess Orientation: : Fluctuating Orientation (Suspected and/or reported Sundowners) Alcohol / Substance Use: Illicit Drugs Psych Involvement: No (comment)  Admission diagnosis:  Hypokalemia [E87.6] Marijuana abuse [F12.10] Tachycardia [R00.0] Acetaminophen overdose [T39.1X1A] QT prolongation [R94.31] Intentional acetaminophen overdose, initial encounter (HCC) [T39.1X2A] Suicide attempt by acetaminophen overdose, initial encounter [facility]) [T39.1X2A] Patient Active Problem List   Diagnosis Date Noted  . Acetaminophen overdose 08/11/2020  . Endotracheally intubated 08/11/2020  . Hypokalemia 08/11/2020  . Dehydration 07/13/2019  . Gastroenteritis 07/13/2019  . ADHD (attention deficit hyperactivity disorder), inattentive type 07/30/2017  . Severe recurrent major depression without psychotic features (HCC)  07/20/2017   PCP:  Hoyt Koch, MD (Inactive) Pharmacy:   [facility] [phone] - [place], [place] - [id number] AT [facility] [address] [place]  [address] Phone: [phone] Fax: [phone]  [facility] [address] [place] - [address] [place] [address] Phone: [phone] Fax: [phone]     Social Determinants of Health (SDOH) Interventions    Readmission Risk Interventions No flowsheet data found.

## 2020-08-11 NOTE — Progress Notes (Addendum)
Done well with 30 min of SBT ET tube has caused some nausea and vomit but eturns went t hrough NG tube  Plan Extubate Keep NG tube Zofran x 1 Sitter to bedside (mom admitted he has had depression x few year and recently increased)   [facility] parimry from 08/12/20 d/w Dr Margot Ables   SIGNATURE    Dr. Kalman Shan, M.D., F.C.C.P,  Pulmonary and Critical Care Medicine Staff Physician, [facility] Director - Interstitial Lung Disease  Program  Pulmonary Fibrosis [facility] at [facility] [place], [place], [address]  Pager: [phone], If no answer  OR between  19:00-7:00h: page [phone]  [phone] Telephone (clinical office): [phone] Telephone (research): [phone]  6:13 PM 08/11/2020

## 2020-08-11 NOTE — ED Notes (Signed)
NG tube advanced approx 7cm per MD order

## 2020-08-11 NOTE — ED Provider Notes (Signed)
[facility]-EMERGENCY DEPT Provider Note   CSN: [id number] Arrival date & time: 08/10/20  2319     History Chief Complaint  Patient presents with  . Drug Overdose    Johnny Delgado is a 25 y.o. male.  The history is provided by the EMS personnel and the patient. The history is limited by the condition of the patient.  Drug Overdose This is a new problem. The current episode started 1 to 2 hours ago. The problem occurs constantly. The problem has not changed since onset.Pertinent negatives include no chest pain and no abdominal pain. Nothing aggravates the symptoms. Nothing relieves the symptoms. He has tried nothing for the symptoms. The treatment provided no relief.  Patient with anxiety and depression who intentionally overdosed on 10-15 Prozac, 10-15 Hydroxyzine, 10-15 Klonopin, 150-200# 500 mg Tylenol and Quietapine an unknown amount in order to kill himself 1 hour and 20 minutes PTA.       Past Medical History:  Diagnosis Date  . ADHD (attention deficit hyperactivity disorder)   . Anxiety   . Depression     Patient Active Problem List   Diagnosis Date Noted  . Dehydration 07/13/2019  . Gastroenteritis 07/13/2019  . ADHD (attention deficit hyperactivity disorder), inattentive type 07/30/2017  . Severe recurrent major depression without psychotic features (HCC) 07/20/2017    Past Surgical History:  Procedure Laterality Date  . WISDOM TOOTH EXTRACTION Bilateral 2015   All 4 extracted       Family History  Problem Relation Age of Onset  . Depression Mother   . Drug abuse Mother   . Depression Father   . Drug abuse Father   . ADD / ADHD Brother   . Drug abuse Paternal Grandmother     Social History   Tobacco Use  . Smoking status: Never Smoker  . Smokeless tobacco: Never Used  Vaping Use  . Vaping Use: Some days  Substance Use Topics  . Alcohol use: No  . Drug use: No    Home Medications Prior to Admission medications    Medication Sig Start Date End Date Taking? Authorizing Provider  loperamide (IMODIUM) 2 MG capsule Take 2 capsules (4 mg total) by mouth as needed for diarrhea or loose stools. 07/15/19   Ghimire, Werner Lean, MD  ondansetron (ZOFRAN) 4 MG tablet Take 1 tablet (4 mg total) by mouth every 6 (six) hours as needed for nausea or vomiting. 07/15/19   Ghimire, Werner Lean, MD    Allergies    Banana  Review of Systems   Review of Systems  Unable to perform ROS: Acuity of condition  Constitutional: Negative for fever.  HENT: Negative for facial swelling.   Cardiovascular: Negative for chest pain.  Gastrointestinal: Positive for vomiting. Negative for abdominal pain.  Skin: Negative for rash.  Psychiatric/Behavioral: Positive for self-injury and suicidal ideas. Negative for agitation.    Physical Exam Updated Vital Signs BP 131/75   Pulse (!) 159   Temp (!) 97.4 F (36.3 C)   Resp 13   Ht 5\' 10"  (1.778 m)   Wt 99.8 kg   SpO2 95%   BMI 31.57 kg/m   Physical Exam Vitals and nursing note reviewed. Exam conducted with a chaperone present.  Constitutional:      Appearance: He is normal weight. He is not diaphoretic.  HENT:     Head: Normocephalic and atraumatic.     Nose: Nose normal.     Mouth/Throat:     Mouth: Mucous membranes  are moist.  Eyes:     Conjunctiva/sclera: Conjunctivae normal.     Pupils: Pupils are equal, round, and reactive to light.  Cardiovascular:     Rate and Rhythm: Regular rhythm. Tachycardia present.     Pulses: Normal pulses.     Heart sounds: Normal heart sounds.  Pulmonary:     Effort: Pulmonary effort is normal. No respiratory distress.     Breath sounds: Normal breath sounds.  Abdominal:     General: Abdomen is flat. Bowel sounds are normal.     Palpations: Abdomen is soft.     Tenderness: There is no abdominal tenderness. There is no guarding.  Musculoskeletal:        General: Normal range of motion.     Cervical back: Normal range of motion and neck  supple. No rigidity.  Lymphadenopathy:     Cervical: No cervical adenopathy.  Skin:    General: Skin is warm and dry.     Capillary Refill: Capillary refill takes less than 2 seconds.  Neurological:     General: No focal deficit present.     Mental Status: He is oriented to person, place, and time.     Deep Tendon Reflexes: Reflexes normal.  Psychiatric:        Thought Content: Thought content includes suicidal ideation. Thought content includes suicidal plan.     ED Results / Procedures / Treatments   Labs (all labs ordered are listed, but only abnormal results are displayed) Results for orders placed or performed during the hospital encounter of 08/10/20  Comprehensive metabolic panel  Result Value Ref Range   Sodium 137 135 - 145 mmol/L   Potassium 2.8 (L) 3.5 - 5.1 mmol/L   Chloride 105 98 - 111 mmol/L   CO2 20 (L) 22 - 32 mmol/L   Glucose, Bld 143 (H) 70 - 99 mg/dL   BUN 9 6 - 20 mg/dL   Creatinine, Ser 8.67 0.61 - 1.24 mg/dL   Calcium 9.2 8.9 - 61.9 mg/dL   Total Protein 7.8 6.5 - 8.1 g/dL   Albumin 4.5 3.5 - 5.0 g/dL   AST 34 15 - 41 U/L   ALT 37 0 - 44 U/L   Alkaline Phosphatase 48 38 - 126 U/L   Total Bilirubin 0.7 0.3 - 1.2 mg/dL   GFR, Estimated >50 >93 mL/min   Anion gap 12 5 - 15  Urine rapid drug screen (hosp performed)  Result Value Ref Range   Opiates NONE DETECTED NONE DETECTED   Cocaine NONE DETECTED NONE DETECTED   Benzodiazepines NONE DETECTED NONE DETECTED   Amphetamines NONE DETECTED NONE DETECTED   Tetrahydrocannabinol POSITIVE (A) NONE DETECTED   Barbiturates NONE DETECTED NONE DETECTED  CBC WITH DIFFERENTIAL  Result Value Ref Range   WBC 7.3 4.0 - 10.5 K/uL   RBC 4.90 4.22 - 5.81 MIL/uL   Hemoglobin 14.2 13.0 - 17.0 g/dL   HCT 26.7 12.4 - 58.0 %   MCV 86.9 80.0 - 100.0 fL   MCH 29.0 26.0 - 34.0 pg   MCHC 33.3 30.0 - 36.0 g/dL   RDW 99.8 33.8 - 25.0 %   Platelets 125 (L) 150 - 400 K/uL   nRBC 0.0 0.0 - 0.2 %   Neutrophils Relative % 42  %   Neutro Abs 3.0 1.7 - 7.7 K/uL   Lymphocytes Relative 45 %   Lymphs Abs 3.3 0.7 - 4.0 K/uL   Monocytes Relative 10 %   Monocytes Absolute 0.7 0.1 -  1.0 K/uL   Eosinophils Relative 2 %   Eosinophils Absolute 0.1 0.0 - 0.5 K/uL   Basophils Relative 0 %   Basophils Absolute 0.0 0.0 - 0.1 K/uL   Immature Granulocytes 1 %   Abs Immature Granulocytes 0.04 0.00 - 0.07 K/uL  Magnesium  Result Value Ref Range   Magnesium 2.0 1.7 - 2.4 mg/dL  Acetaminophen level  Result Value Ref Range   Acetaminophen (Tylenol), Serum 181 (HH) 10 - 30 ug/mL  Ethanol  Result Value Ref Range   Alcohol, Ethyl (B) <10 <10 mg/dL  Salicylate level  Result Value Ref Range   Salicylate Lvl <7.0 (L) 7.0 - 30.0 mg/dL  CBG monitoring, ED  Result Value Ref Range   Glucose-Capillary 134 (H) 70 - 99 mg/dL   DG Abdomen 1 View  Result Date: 08/11/2020 CLINICAL DATA:  NG tube placement EXAM: ABDOMEN - 1 VIEW COMPARISON:  CT 07/13/2019. FINDINGS: Transesophageal tube tip and side port terminate distal to the GE junction. Lung bases are clear. There is a relative paucity of upper abdominal bowel gas which is a nonspecific finding which can be seen both is a normal finding or in the setting of obstruction, correlate with abdominal findings. No visible subdiaphragmatic free air on this semi upright exam. No worrisome calcifications. IMPRESSION: 1. Transesophageal tube tip and side port terminate distal to the GE junction. 2. Paucity of upper abdominal bowel gas, a nonspecific finding. 3. No visible subdiaphragmatic free air on this semi upright exam. Electronically Signed   By: Kreg Shropshire M.D.   On: 08/11/2020 00:28   DG Chest Portable 1 View  Result Date: 08/10/2020 CLINICAL DATA:  Overdose EXAM: PORTABLE CHEST 1 VIEW COMPARISON:  Radiograph 07/13/2019 FINDINGS: Low volumes with hypoventilatory atelectatic changes and vascular crowding. No convincing features of frank edema. No pneumothorax or visible effusion though portion  of the right costophrenic sulcus is collimated. Cardiomediastinal contours are prominent though possibly accentuated by portable technique and low volumes. Telemetry leads overlie the chest. No acute osseous or soft tissue abnormality. IMPRESSION: 1. Low lung volumes with hypoventilatory changes and vascular crowding. 2. Prominence of the cardiomediastinal contours, possibly accentuated by portable technique and low volumes. Electronically Signed   By: Kreg Shropshire M.D.   On: 08/10/2020 23:49  ]  EKG EKG Interpretation  Date/Time:  Tuesday August 10 2020 23:27:48 EST Ventricular Rate:  159 PR Interval:    QRS Duration: 96 QT Interval:  346 QTC Calculation: 563 R Axis:   45 Text Interpretation: Sinus tachycardia Prolonged QT interval Confirmed by Marilyn Nihiser ([id number]) on 08/10/2020 11:52:17 PM   Radiology DG Chest Portable 1 View  Result Date: 08/10/2020 CLINICAL DATA:  Overdose EXAM: PORTABLE CHEST 1 VIEW COMPARISON:  Radiograph 07/13/2019 FINDINGS: Low volumes with hypoventilatory atelectatic changes and vascular crowding. No convincing features of frank edema. No pneumothorax or visible effusion though portion of the right costophrenic sulcus is collimated. Cardiomediastinal contours are prominent though possibly accentuated by portable technique and low volumes. Telemetry leads overlie the chest. No acute osseous or soft tissue abnormality. IMPRESSION: 1. Low lung volumes with hypoventilatory changes and vascular crowding. 2. Prominence of the cardiomediastinal contours, possibly accentuated by portable technique and low volumes. Electronically Signed   By: Kreg Shropshire M.D.   On: 08/10/2020 23:49    Procedures Procedure Name: Intubation Date/Time: 08/11/2020 1:10 AM Performed by: Cy Blamer, MD Pre-anesthesia Checklist: Patient identified, Patient being monitored, Emergency Drugs available, Timeout performed and Suction available Oxygen Delivery Method: Non-rebreather  mask Preoxygenation: Pre-oxygenation with 100% oxygen Induction Type: Rapid sequence Ventilation: Mask ventilation without difficulty Laryngoscope Size: Glidescope and 4 Grade View: Grade II Tube size: 7.5 mm Number of attempts: 2 Airway Equipment and Method: Patient positioned with wedge pillow Placement Confirmation: ETT inserted through vocal cords under direct vision,  CO2 detector and Breath sounds checked- equal and bilateral Secured at: 24 cm Tube secured with: ETT holder Dental Injury: Teeth and Oropharynx as per pre-operative assessment  Difficulty Due To: Difficulty was anticipated        Medications Ordered in ED Medications  sodium chloride 0.9 % bolus 1,000 mL (0 mLs Intravenous Stopped 08/11/20 0000)    And  0.9 %  sodium chloride infusion ( Intravenous New Bag/Given 08/10/20 2342)  sodium bicarbonate 150 mEq in dextrose 5 % 1,000 mL infusion ( Intravenous New Bag/Given 08/10/20 2354)  acetylcysteine (ACETADOTE) 40 mg/mL load via infusion 14,970 mg (14,970 mg Intravenous Bolus from Bag 08/11/20 0014)    Followed by  acetylcysteine (ACETADOTE) 24,000 mg in dextrose 5 % 600 mL (40 mg/mL) infusion (15 mg/kg/hr  99.8 kg Intravenous New Bag/Given 08/11/20 0014)  potassium chloride 10 mEq in 100 mL IVPB (has no administration in time range)  potassium chloride (KLOR-CON) packet 80 mEq (has no administration in time range)  activated charcoal-sorbitol (ACTIDOSE-SORBITOL) suspension 50 g (50 g Oral Given 08/10/20 2337)    ED Course  I have reviewed the triage vital signs and the nursing notes.  Pertinent labs & imaging results that were available during my care of the patient were reviewed by me and considered in my medical decision making (see chart for details).   Patient vomited PTA, based on quantity of ingestion decision made to start AN-acetylcysteine immediately upon presentation.  See poison control notes separately.  Patient becoming more somnolent and also vomited with  NGT in place.  Decision made to intubate based on emesis and somnolence as patient was becoming for somnolent  MDM Reviewed: previous chart, nursing note and vitals Interpretation: labs, ECG and x-ray (hypokalemia, tylenol level above Rumack Matthews nomagram as is it < 2 hour level.  ) Total time providing critical care: 75-105 minutes (potassium runs Acetodote drip ). This excludes time spent performing separately reportable procedures and services. Consults: critical care  CRITICAL CARE Performed by: Zain Lankford K Jennessy Sandridge-Rasch Total critical care time: 90 minutes Critical care time was exclusive of separately billable procedures and treating other patients. Critical care was necessary to treat or prevent imminent or life-threatening deterioration. Critical care was time spent personally by me on the following activities: development of treatment plan with patient and/or surrogate as well as nursing, discussions with consultants, evaluation of patient's response to treatment, examination of patient, obtaining history from patient or surrogate, ordering and performing treatments and interventions, ordering and review of laboratory studies, ordering and review of radiographic studies, pulse oximetry and re-evaluation of patient's condition.  Final Clinical Impression(s) / ED Diagnoses Final diagnoses:  Intentional drug overdose, initial encounter [facility](HCC)  Intentional acetaminophen overdose, initial encounter (HCC)  Tachycardia  Hypokalemia   Admit to hospital will need to be on N-AC for 24 hours.     Jaydalyn Demattia, MD 08/11/20 [id number]

## 2020-08-11 NOTE — ED Notes (Signed)
Pt vomiting large amounts of charcoal

## 2020-08-11 NOTE — Progress Notes (Signed)
NAME:  Johnny Delgado, MRN:  [id number], DOB:  [birth year], LOS: 0 ADMISSION DATE:  08/10/2020, CONSULTATION DATE: 3/8 REFERRING MD:  Dr. Nicanor Alcon, CHIEF COMPLAINT:  Overdose   Brief History:  25 y/o M who presented to [facility] on 3/8 with reported concerns of overdose.  EMS activated by a roommate with reports of possible 150-200 (500mg ) tylenol tablets taken, 10-15 fluoxetine, 10-15 hydroxyzine.  He was witnessed by EMS to have emesis with pill fragments in it.  The patient was drowsy in the ER and intubated for airway protection.  He was given activated charcoal in the ER which resulted in immediate emesis.  Admitted to ICU for further care.   Past Medical History:  Depression  Anxiety  ADHD  Significant Hospital Events:  3/08 Admit with overdose  Consults:    Procedures:  ETT 3/8 >>   Significant Diagnostic Tests:    Micro Data:  COVID 3/8 >> negative  MRSA PCR 3/9 >> negative   Antimicrobials:    Interim History / Subjective:  Afebrile On vent, sedate 1.3L UOP Not on pressors Tylenol level coming down  Objective   Blood pressure 109/64, pulse 74, temperature (!) 97.2 F (36.2 C), resp. rate 18, height 5\' 10"  (1.778 m), weight 95.8 kg, SpO2 100 %.    Vent Mode: PRVC FiO2 (%):  [40 %-80 %] 40 % Set Rate:  [18 bmp] 18 bmp Vt Set:  [580 mL] 580 mL PEEP:  [5 cmH20] 5 cmH20 Plateau Pressure:  [17 cmH20-19 cmH20] 17 cmH20   Intake/Output Summary (Last 24 hours) at 08/11/2020 0818 Last data filed at 08/11/2020 10/11/2020 Gross per 24 hour  Intake 3483.88 ml  Output 1400 ml  Net 2083.88 ml   Filed Weights   08/10/20 2333 08/11/20 0400  Weight: 99.8 kg 95.8 kg    Examination: General: young adult male lying inbed in NAD on vent  HEENT: MM pink/moist, ETT Neuro: pupils 67mm, opens eyes to voice, acknowledges provider CV: s1s2 RRR, no m/r/g PULM:  Non-labored, lungs clear bilaterally GI: soft, bsx4 active  Extremities: warm/dry, no edema  Skin: no rashes or lesions,  multiple tattoos  Resolved Hospital Problem list     Assessment & Plan:   Polysubstance Overdose  Major Depression  Ingested prozac, hydroxyzine and acetaminophen. UDS +THC. ETOH negative.   -supportive care -NAC protocol initiated, follow tylenol level (initial level believed to have been drawn 1 hour after ingestion, 180) -repeat level at 11pm per Poison Control, likely can stop NAC pending follow up level  -bicarbonate gtt at 73ml/hr  -monitor QTc -will need PSY consult once extubated & can participate in interview  Acute Respiratory Insufficiency in setting of Overdose -PRVC 8cc/kg -wean PEEP / FiO2 for sats >90% -SBT / WUA with goal for extubation today -follow intermittent CXR  -PAD protocol with precedex / fentanyl   Prolonged QTc In setting of ingestion  -follow QTc -tele monitoring   Hypokalemia  Hypomagnesemia Hypophosphatemia  -monitor, replace as indicated  -NS with 3m KCL at 67ml/hr, monitor K closely   Best practice (evaluated daily)  Diet: NPO  Pain/Anxiety/Delirium protocol (if indicated): PAD protocol  VAP protocol (if indicated): In place  DVT prophylaxis: heparin SQ GI prophylaxis: PPI  Glucose control: n/a  Mobility: As tolerated  Disposition:ICU   Goals of Care:  Last date of multidisciplinary goals of care discussion:  Family and staff present:  Summary of discussion:  Follow up goals of care discussion due:  Code Status: Full Code  Labs   CBC: Recent Labs  Lab 08/10/20 2324 08/11/20 0425  WBC 7.3 10.3  NEUTROABS 3.0  --   HGB 14.2 13.1  HCT 42.6 38.2*  MCV 86.9 86.6  PLT 125* 125*    Basic Metabolic Panel: Recent Labs  Lab 08/10/20 2324 08/10/20 2330 08/11/20 0425 08/11/20 0713  NA 137  --   --  136  K 2.8*  --   --  3.4*  CL 105  --   --  105  CO2 20*  --   --  21*  GLUCOSE 143*  --   --  153*  BUN 9  --   --  7  CREATININE 0.93  --   --  0.62  CALCIUM 9.2  --   --  7.9*  MG  --  2.0 1.9 2.4  PHOS  --   --   2.4*  --    GFR: Estimated Creatinine Clearance: 166.8 mL/min (by C-G formula based on SCr of 0.62 mg/dL). Recent Labs  Lab 08/10/20 2324 08/11/20 0425  WBC 7.3 10.3    Liver Function Tests: Recent Labs  Lab 08/10/20 2324 08/11/20 0713  AST 34 21  ALT 37 32  ALKPHOS 48 26*  BILITOT 0.7 1.0  PROT 7.8 6.6  ALBUMIN 4.5 3.7   No results for input(s): LIPASE, AMYLASE in the last 168 hours. No results for input(s): AMMONIA in the last 168 hours.  ABG    Component Value Date/Time   PHART 7.354 08/11/2020 0348   PCO2ART 42.7 08/11/2020 0348   PO2ART 207 (H) 08/11/2020 0348   HCO3 23.4 08/11/2020 0348   ACIDBASEDEF 1.9 08/11/2020 0348   O2SAT 99.6 08/11/2020 0348     Coagulation Profile: Recent Labs  Lab 08/11/20 0713  INR 1.2    Cardiac Enzymes: No results for input(s): CKTOTAL, CKMB, CKMBINDEX, TROPONINI in the last 168 hours.  HbA1C: Hgb A1c MFr Bld  Date/Time Value Ref Range Status  07/21/2017 06:13 AM 5.7 (H) 4.8 - 5.6 % Final    Comment:    (NOTE) Pre diabetes:          5.7%-6.4% Diabetes:              >6.4% Glycemic control for   <7.0% adults with diabetes     CBG: Recent Labs  Lab 08/10/20 2332  GLUCAP 134*     Critical care time: 32 minutes      Canary Brim, MSN, APRN, NP-C, AGACNP-BC [facility] 08/11/2020, 8:18 AM   Please see [email or web address] for pager details.   From 7A-7P if no response, please call [phone] After hours, please call ELink [phone]

## 2020-08-11 NOTE — Progress Notes (Signed)
80 mL fentanyl drip wasted with Lyndee Hensen, RN

## 2020-08-11 NOTE — Progress Notes (Signed)
Poison Control would like Labs drawn at 2300 for liver and Tylenol

## 2020-08-11 NOTE — ED Notes (Signed)
Pt intubated at 0055, + color change and breath sounds in all fields. XR at bedside for confirmation.

## 2020-08-11 NOTE — Progress Notes (Signed)
eLink Physician-Brief Progress Note Patient Name: Johnny Delgado DOB: [birth year] MRN: [id number]   Date of Service  08/11/2020  HPI/Events of Note  Multiple issues: 1. Hypomagnesemia - Mg++ = 1.9 and 2. Hypophosphatemia - PO4 = 2.4.  eICU Interventions  Plan: 1. Replace Mg++ and PO4---.     Intervention Category Major Interventions: Electrolyte abnormality - evaluation and management  Kiele Heavrin Eugene 08/11/2020, 5:33 AM

## 2020-08-11 NOTE — Progress Notes (Signed)
PM rounds  S: wrriting notes on precedex gtt and fent gtt. Says he can be extubated safely. Wants to be extubated.   O Looks well on vent CAM-ICU neg for delirium Strong     LABS  Results for YOSEF, KROGH (MRN [id number]) as of 08/11/2020 17:27  Ref. Range 08/10/2020 23:30 08/11/2020 02:05 08/11/2020 11:34  Acetaminophen (Tylenol), S Latest Ref Range: 10 - 30 ug/mL 181 (HH) 108 (H) 17    PULMONARY Recent Labs  Lab 08/11/20 0152 08/11/20 0348  PHART 7.390 7.354  PCO2ART 39.4 42.7  PO2ART 244* 207*  HCO3 23.4 23.4  O2SAT 99.9 99.6    CBC Recent Labs  Lab 08/10/20 2324 08/11/20 0425 08/11/20 1134  HGB 14.2 13.1 12.9*  HCT 42.6 38.2* 37.8*  WBC 7.3 10.3 9.1  PLT 125* 125* 124*    COAGULATION Recent Labs  Lab 08/11/20 0713 08/11/20 1134  INR 1.2 1.9*    CARDIAC  No results for input(s): TROPONINI in the last 168 hours. No results for input(s): PROBNP in the last 168 hours.   CHEMISTRY Recent Labs  Lab 08/10/20 2324 08/10/20 2330 08/11/20 0425 08/11/20 0713 08/11/20 1134  NA 137  --   --  136 146*  K 2.8*  --   --  3.4* 3.1*  CL 105  --   --  105 108  CO2 20*  --   --  21* 21*  GLUCOSE 143*  --   --  153* 245*  BUN 9  --   --  7 6  CREATININE 0.93  --   --  0.62 0.65  CALCIUM 9.2  --   --  7.9* 8.1*  MG  --  2.0 1.9 2.4 2.1  PHOS  --   --  2.4*  --  1.8*   Estimated Creatinine Clearance: 166.8 mL/min (by C-G formula based on SCr of 0.65 mg/dL).   LIVER Recent Labs  Lab 08/10/20 2324 08/11/20 0713 08/11/20 1134  AST 34 21 18  ALT 37 32 29  ALKPHOS 48 26* 23*  BILITOT 0.7 1.0 0.9  PROT 7.8 6.6 6.4*  ALBUMIN 4.5 3.7 3.5  INR  --  1.2 1.9*     INFECTIOUS Recent Labs  Lab 08/11/20 1134  LATICACIDVEN 0.7  PROCALCITON <0.10     ENDOCRINE CBG (last 3)  Recent Labs    08/10/20 2332  GLUCAP 134*         IMAGING x48h  - image(s) personally visualized  -   highlighted in bold DG Abdomen 1 View  Result Date:  08/11/2020 CLINICAL DATA:  NG tube placement EXAM: ABDOMEN - 1 VIEW COMPARISON:  CT 07/13/2019. FINDINGS: Transesophageal tube tip and side port terminate distal to the GE junction. Lung bases are clear. There is a relative paucity of upper abdominal bowel gas which is a nonspecific finding which can be seen both is a normal finding or in the setting of obstruction, correlate with abdominal findings. No visible subdiaphragmatic free air on this semi upright exam. No worrisome calcifications. IMPRESSION: 1. Transesophageal tube tip and side port terminate distal to the GE junction. 2. Paucity of upper abdominal bowel gas, a nonspecific finding. 3. No visible subdiaphragmatic free air on this semi upright exam. Electronically Signed   By: Kreg Shropshire M.D.   On: 08/11/2020 00:28   DG Chest Portable 1 View  Result Date: 08/11/2020 CLINICAL DATA:  Status post intubation EXAM: PORTABLE CHEST 1 VIEW COMPARISON:  Film from the previous day.  FINDINGS: Endotracheal tube and gastric catheter are noted in satisfactory position. Cardiac shadow is within normal limits. Hypoinflation is again seen. The lungs are clear. No bony abnormality is noted. IMPRESSION: Tubes and lines are noted in satisfactory position. Overall poor inspiratory effort without acute abnormality. Electronically Signed   By: Alcide Clever M.D.   On: 08/11/2020 01:12   DG Chest Portable 1 View  Result Date: 08/10/2020 CLINICAL DATA:  Overdose EXAM: PORTABLE CHEST 1 VIEW COMPARISON:  Radiograph 07/13/2019 FINDINGS: Low volumes with hypoventilatory atelectatic changes and vascular crowding. No convincing features of frank edema. No pneumothorax or visible effusion though portion of the right costophrenic sulcus is collimated. Cardiomediastinal contours are prominent though possibly accentuated by portable technique and low volumes. Telemetry leads overlie the chest. No acute osseous or soft tissue abnormality. IMPRESSION: 1. Low lung volumes with  hypoventilatory changes and vascular crowding. 2. Prominence of the cardiomediastinal contours, possibly accentuated by portable technique and low volumes. Electronically Signed   By: Kreg Shropshire M.D.   On: 08/10/2020 23:49    ASSSNMENT  Tylenol OD  - levels better - LFT and creat normal  - INR creeping up     Low K and Low Phos  Plan  - replete both   Acute resp failure  - meets SBT critieral   SEdation needs  - on precedex gtt and fent gtt  Plan  - dc fent gtt   - do SBT on precedex -> aim to extubate 08/11/2020 - replete K phos - contnue NAC - track coags - sitter to bedside   Mom and patine tupdated     SIGNATURE    Dr. Kalman Shan, M.D., F.C.C.P,  Pulmonary and Critical Care Medicine Staff Physician, [facility] Director - Interstitial Lung Disease  Program  Pulmonary Fibrosis [facility] at [facility] [place], [place], [address]  Pager: [phone], If no answer  OR between  19:00-7:00h: page [phone] Telephone (clinical office): [phone] Telephone (research): [phone]  5:38 PM 08/11/2020

## 2020-08-11 NOTE — ED Notes (Signed)
Pt moved to Res A to prepare for intubation, Palumbo, MD at bedside

## 2020-08-12 ENCOUNTER — Other Ambulatory Visit: Payer: Self-pay

## 2020-08-12 ENCOUNTER — Inpatient Hospital Stay (HOSPITAL_COMMUNITY): Payer: BC Managed Care – PPO

## 2020-08-12 DIAGNOSIS — T391X1D Poisoning by 4-Aminophenol derivatives, accidental (unintentional), subsequent encounter: Secondary | ICD-10-CM

## 2020-08-12 DIAGNOSIS — F332 Major depressive disorder, recurrent severe without psychotic features: Secondary | ICD-10-CM | POA: Diagnosis not present

## 2020-08-12 LAB — PROCALCITONIN: Procalcitonin: <0.1 ng/mL

## 2020-08-12 LAB — PROTIME-INR
INR: 1.2 (ref 0.8–1.2)
Prothrombin Time: 14.9 seconds (ref 11.4–15.2)

## 2020-08-12 LAB — HEPATIC FUNCTION PANEL
ALT: 27 U/L (ref 0–44)
AST: 20 U/L (ref 15–41)
Albumin: 3.6 g/dL (ref 3.5–5.0)
Alkaline Phosphatase: 29 U/L — ABNORMAL LOW (ref 38–126)
Bilirubin, Direct: 0.1 mg/dL (ref 0.0–0.2)
Indirect Bilirubin: 1 mg/dL — ABNORMAL HIGH (ref 0.3–0.9)
Total Bilirubin: 1.1 mg/dL (ref 0.3–1.2)
Total Protein: 6.4 g/dL — ABNORMAL LOW (ref 6.5–8.1)

## 2020-08-12 LAB — MAGNESIUM: Magnesium: 2 mg/dL (ref 1.7–2.4)

## 2020-08-12 LAB — BASIC METABOLIC PANEL
Anion gap: 7 (ref 5–15)
BUN: 6 mg/dL (ref 6–20)
CO2: 22 mmol/L (ref 22–32)
Calcium: 8.5 mg/dL — ABNORMAL LOW (ref 8.9–10.3)
Chloride: 109 mmol/L (ref 98–111)
Creatinine, Ser: 0.68 mg/dL (ref 0.61–1.24)
GFR, Estimated: >60 mL/min (ref >60)
Glucose, Bld: 110 mg/dL — ABNORMAL HIGH (ref 70–99)
Potassium: 3.8 mmol/L (ref 3.5–5.1)
Sodium: 138 mmol/L (ref 135–145)

## 2020-08-12 LAB — LACTIC ACID, PLASMA: Lactic Acid, Venous: 0.8 mmol/L (ref 0.5–1.9)

## 2020-08-12 LAB — ACETAMINOPHEN LEVEL: Acetaminophen (Tylenol), Serum: <10 ug/mL — ABNORMAL LOW (ref 10–30)

## 2020-08-12 MED ORDER — SODIUM CHLORIDE 0.9 % IV SOLN: INTRAVENOUS | Status: DC

## 2020-08-12 MED ORDER — HYDRALAZINE HCL 25 MG PO TABS: 25.0000 mg | ORAL_TABLET | Freq: Four times a day (QID) | ORAL | Status: DC | PRN

## 2020-08-12 MED ORDER — IBUPROFEN 200 MG PO TABS
400.0000 mg | ORAL_TABLET | Freq: Four times a day (QID) | ORAL | Status: DC | PRN
  Administered 2020-08-12 – 2020-08-13 (×2): 400 mg via ORAL
  Filled 2020-08-12 (×2): qty 2

## 2020-08-12 MED ORDER — IBUPROFEN 100 MG/5ML PO SUSP
400.0000 mg | Freq: Once | ORAL | Status: AC
  Administered 2020-08-12: 400 mg
  Filled 2020-08-12: qty 20

## 2020-08-12 MED ORDER — DIPHENHYDRAMINE HCL 25 MG PO CAPS
25.0000 mg | ORAL_CAPSULE | Freq: Every evening | ORAL | Status: DC | PRN
  Administered 2020-08-12: 25 mg via ORAL
  Filled 2020-08-12: qty 1

## 2020-08-12 NOTE — Progress Notes (Signed)
eLink Physician-Brief Progress Note Patient Name: Johnny Delgado DOB: 08-23-1995 MRN: [id number]   Date of Service  08/12/2020  HPI/Events of Note  Patient c/o HA. Creatinine = 0.76  eICU Interventions  Plan: 1. Motrin liquid 400 mg per tube X 1 now.      Intervention Category Major Interventions: Other:  Tennille Montelongo Dennard Nip 08/12/2020, 2:18 AM

## 2020-08-12 NOTE — Consult Note (Signed)
[place] [facility] Consult   Reason for Consult:  Suicide attempt Referring Physician:  Dr. Antonieta Pert  Patient Identification: Johnny Delgado MRN:  [id number] Principal Diagnosis: Severe recurrent major depression without psychotic features [facility]) Diagnosis:  Principal Problem:   Severe recurrent major depression without psychotic features ([place]) Active Problems:   Acetaminophen overdose   Endotracheally intubated   Hypokalemia   Acute respiratory failure ([place])   Total Time spent with patient: 45 minutes  Subjective:   Johnny Delgado is a 25 y.o. male patient admitted with intentional overdose by suicide attempt. Patient reports long standing history of depression, that has worsened over the past few months. He states since October of 2021 he has struggled with depression 2/t finances, self dislike, and job problems. He states during this time he was seen by his PCP who prescribed fluoxetine and hydroxyzine for him. He states he began to feel better with the medications however it became to expensive and he stopped taking them. He states on Thursday he made an attempt to talk to his family and friends, however continued to feel bad. On Tuesday of this week he became despondent, disconnected and only cared about himself. He states at that time he took all the pills that he could find. His memory and recollection of the events are quite vague, although he reports taking "quetapine, los of Ibuprofen, I mean lots of ibuprofen, some allergy medicine, some antidepressant and anti-anxiety medications. " He reports very few stressors besides finances, bills, and job dislikes. He states he had 3 previous suicide attempts, further clarification sought appears there more suicidal ideations and thoughts. " I attempted to cut my wrist but stopped, and and tried to walk into traffic on [address] but I stopped. " He reports one previous inpatient admission at [facility] in 2019 for suicidal ideations. He  denies any current outpatient providers.  He denies illicit substance use but reports use of delta 8, about 1-2 x a week to help him calm down.  He currently denies suicidal ideations at the time of this evaluation and is able to contract for safety.   HPI: 25 year old male with stable anxiety, depression, ADHD admitted on 3/8 due to concern of overdose after EMS activated by her roommate with report of possible 150-200 (515m) tylenol tablets taken, 10-15 fluoxetine, 10-15 hydroxyzine. He was witnessed by EMS to have emesis with pill fragments in it. The patient was drowsy in the ER and intubated for airway protection. He was given activated charcoal in the ER which resulted in immediate emesis.  Patient was admitted to ICU.  Patient was monitored closely for polysubstance overdose/major depression with ingestion of Prozac, hydroxyzine, Tylenol UDS positive for THC, EtOH negative, managed with NAC protocol, bicarb drip. Patient LFTs INR Tylenol level overall is stable and extubated 3[facility]  Past Psychiatric History:   Risk to Self:  Yes Risk to Others:   No Prior Inpatient Therapy:   [facility], 2019 for suicidal ideations Prior Outpatient Therapy:  Denies   Past Medical History:  Past Medical History:  Diagnosis Date  . ADHD (attention deficit hyperactivity disorder)   . Anxiety   . Depression     Past Surgical History:  Procedure Laterality Date  . WISDOM TOOTH EXTRACTION Bilateral 2015   All 4 extracted   Family History:  Family History  Problem Relation Age of Onset  . Depression Mother   . Drug abuse Mother   . Depression Father   .  Drug abuse Father   . ADD / ADHD Brother   . Drug abuse Paternal Grandmother    Family Psychiatric  History: As per patient suspected family history of  Mother with depression, and father with narcissistic behaviors.   Social History:  Social History   Substance and Sexual Activity  Alcohol Use No     Social  History   Substance and Sexual Activity  Drug Use No    Social History   Socioeconomic History  . Marital status: Single    Spouse name: Not on file  . Number of children: Not on file  . Years of education: Not on file  . Highest education level: Not on file  Occupational History  . Not on file  Tobacco Use  . Smoking status: Never Smoker  . Smokeless tobacco: Never Used  Vaping Use  . Vaping Use: Some days  Substance and Sexual Activity  . Alcohol use: No  . Drug use: No  . Sexual activity: Never  Other Topics Concern  . Not on file  Social History Narrative  . Not on file   Social Determinants of Health   Financial Resource Strain: Not on file  Food Insecurity: Not on file  Transportation Needs: Not on file  Physical Activity: Not on file  Stress: Not on file  Social Connections: Not on file   Additional Social History:    Allergies:   Allergies  Allergen Reactions  . Banana Diarrhea    Labs:  Results for orders placed or performed during the hospital encounter of 08/10/20 (from the past 48 hour(s))  Comprehensive metabolic panel     Status: Abnormal   Collection Time: 08/10/20 11:24 PM  Result Value Ref Range   Sodium 137 135 - 145 mmol/L   Potassium 2.8 (L) 3.5 - 5.1 mmol/L   Chloride 105 98 - 111 mmol/L   CO2 20 (L) 22 - 32 mmol/L   Glucose, Bld 143 (H) 70 - 99 mg/dL    Comment: Glucose reference range applies only to samples taken after fasting for at least 8 hours.   BUN 9 6 - 20 mg/dL   Creatinine, Ser 0.93 0.61 - 1.24 mg/dL   Calcium 9.2 8.9 - 10.3 mg/dL   Total Protein 7.8 6.5 - 8.1 g/dL   Albumin 4.5 3.5 - 5.0 g/dL   AST 34 15 - 41 U/L   ALT 37 0 - 44 U/L   Alkaline Phosphatase 48 38 - 126 U/L   Total Bilirubin 0.7 0.3 - 1.2 mg/dL   GFR, Estimated >60 >60 mL/min    Comment: (NOTE) Calculated using the CKD-EPI Creatinine Equation (2021)    Anion gap 12 5 - 15    Comment: Performed at [facility], [place] [address], [place], [place] [address]  CBC WITH DIFFERENTIAL     Status: Abnormal   Collection Time: 08/10/20 11:24 PM  Result Value Ref Range   WBC 7.3 4.0 - 10.5 K/uL   RBC 4.90 4.22 - 5.81 MIL/uL   Hemoglobin 14.2 13.0 - 17.0 g/dL   HCT 42.6 39.0 - 52.0 %   MCV 86.9 80.0 - 100.0 fL   MCH 29.0 26.0 - 34.0 pg   MCHC 33.3 30.0 - 36.0 g/dL   RDW 11.9 11.5 - 15.5 %   Platelets 125 (L) 150 - 400 K/uL   nRBC 0.0 0.0 - 0.2 %   Neutrophils Relative % 42 %   Neutro Abs 3.0 1.7 - 7.7 K/uL  Lymphocytes Relative 45 %   Lymphs Abs 3.3 0.7 - 4.0 K/uL   Monocytes Relative 10 %   Monocytes Absolute 0.7 0.1 - 1.0 K/uL   Eosinophils Relative 2 %   Eosinophils Absolute 0.1 0.0 - 0.5 K/uL   Basophils Relative 0 %   Basophils Absolute 0.0 0.0 - 0.1 K/uL   Immature Granulocytes 1 %   Abs Immature Granulocytes 0.04 0.00 - 0.07 K/uL    Comment: Performed at [facility], [place] [address] [place], [place] [address]  Magnesium     Status: None   Collection Time: 08/10/20 11:30 PM  Result Value Ref Range   Magnesium 2.0 1.7 - 2.4 mg/dL    Comment: Performed at [facility], [place] [address], [place], [place] [address]  Acetaminophen level     Status: Abnormal   Collection Time: 08/10/20 11:30 PM  Result Value Ref Range   Acetaminophen (Tylenol), Serum 181 (HH) 10 - 30 ug/mL    Comment: CRITICAL RESULT CALLED TO, READ BACK BY AND VERIFIED WITH: rn shannyn at 0026 08/11/20 cru[place]hank a (NOTE) Therapeutic concentrations vary significantly. A range of 10-30 ug/mL  may be an effective concentration for many patients. However, some  are best treated at concentrations outside of this range. Acetaminophen concentrations >150 ug/mL at 4 hours after ingestion  and >50 ug/mL at 12 hours after ingestion are often associated with  toxic reactions.  Performed at [facility], [place] [address], [place], [place] [address]   Ethanol     Status: None   Collection Time:  08/10/20 11:30 PM  Result Value Ref Range   Alcohol, Ethyl (B) <10 <10 mg/dL    Comment: (NOTE) Lowest detectable limit for serum alcohol is 10 mg/dL.  For medical purposes only. Performed at [facility], [place] [address] [place], [place] [address]   Salicylate level     Status: Abnormal   Collection Time: 08/10/20 11:30 PM  Result Value Ref Range   Salicylate Lvl <1.8 (L) 7.0 - 30.0 mg/dL    Comment: Performed at [facility], [place] [address] [place], [place] [address]  CBG monitoring, ED     Status: Abnormal   Collection Time: 08/10/20 11:32 PM  Result Value Ref Range   Glucose-Capillary 134 (H) 70 - 99 mg/dL    Comment: Glucose reference range applies only to samples taken after fasting for at least 8 hours.  Urine rapid drug screen (hosp performed)     Status: Abnormal   Collection Time: 08/10/20 11:53 PM  Result Value Ref Range   Opiates NONE DETECTED NONE DETECTED   Cocaine NONE DETECTED NONE DETECTED   Benzodiazepines NONE DETECTED NONE DETECTED   Amphetamines NONE DETECTED NONE DETECTED   Tetrahydrocannabinol POSITIVE (A) NONE DETECTED   Barbiturates NONE DETECTED NONE DETECTED    Comment: (NOTE) DRUG SCREEN FOR MEDICAL PURPOSES ONLY.  IF CONFIRMATION IS NEEDED FOR ANY PURPOSE, NOTIFY LAB WITHIN 5 DAYS.  LOWEST DETECTABLE LIMITS FOR URINE DRUG SCREEN Drug Class                     Cutoff (ng/mL) Amphetamine and metabolites    1000 Barbiturate and metabolites    200 Benzodiazepine                 970 Tricyclics and metabolites     300 Opiates and metabolites        300 Cocaine and metabolites  300 THC                            50 Performed at [facility], [place] [address] [place], [place] [address]   Resp Panel by RT-PCR (Flu A&B, Covid) Nasopharyngeal Swab     Status: None   Collection Time: 08/10/20 11:53 PM   Specimen: Nasopharyngeal Swab; Nasopharyngeal(NP) swabs in vial transport medium   Result Value Ref Range   SARS Coronavirus 2 by RT PCR NEGATIVE NEGATIVE    Comment: (NOTE) SARS-CoV-2 target nucleic acids are NOT DETECTED.  The SARS-CoV-2 RNA is generally detectable in upper respiratory specimens during the acute phase of infection. The lowest concentration of SARS-CoV-2 viral copies this assay can detect is 138 copies/mL. A negative result does not preclude SARS-Cov-2 infection and should not be used as the sole basis for treatment or other patient management decisions. A negative result may occur with  improper specimen collection/handling, submission of specimen other than nasopharyngeal swab, presence of viral mutation(s) within the areas targeted by this assay, and inadequate number of viral copies(<138 copies/mL). A negative result must be combined with clinical observations, patient history, and epidemiological information. The expected result is Negative.  Fact Sheet for Patients:  EntrepreneurPulse.com.au  Fact Sheet for Healthcare Providers:  IncredibleEmployment.be  This test is no t yet approved or cleared by the [place] FDA and  has been authorized for detection and/or diagnosis of SARS-CoV-2 by FDA under an Emergency Use Authorization (EUA). This EUA will remain  in effect (meaning this test can be used) for the duration of the COVID-19 declaration under Section 564(b)(1) of the Act, 21 U.S.C.section 360bbb-3(b)(1), unless the authorization is terminated  or revoked sooner.       Influenza A by PCR NEGATIVE NEGATIVE   Influenza B by PCR NEGATIVE NEGATIVE    Comment: (NOTE) The Xpert Xpress SARS-CoV-2/FLU/RSV plus assay is intended as an aid in the diagnosis of influenza from Nasopharyngeal swab specimens and should not be used as a sole basis for treatment. Nasal washings and aspirates are unacceptable for Xpert Xpress SARS-CoV-2/FLU/RSV testing.  Fact Sheet for  Patients: EntrepreneurPulse.com.au  Fact Sheet for Healthcare Providers: IncredibleEmployment.be  This test is not yet approved or cleared by the [place] FDA and has been authorized for detection and/or diagnosis of SARS-CoV-2 by FDA under an Emergency Use Authorization (EUA). This EUA will remain in effect (meaning this test can be used) for the duration of the COVID-19 declaration under Section 564(b)(1) of the Act, 21 U.S.C. section 360bbb-3(b)(1), unless the authorization is terminated or revoked.  Performed at [facility], [place] [address] [place], [place] [address]   Blood gas, arterial     Status: Abnormal   Collection Time: 08/11/20  1:52 AM  Result Value Ref Range   FIO2 80.00    pH, Arterial 7.390 7.350 - 7.450   pCO2 arterial 39.4 32.0 - 48.0 mmHg   pO2, Arterial 244 (H) 83.0 - 108.0 mmHg   Bicarbonate 23.4 20.0 - 28.0 mmol/L   Acid-base deficit 0.9 0.0 - 2.0 mmol/L   O2 Saturation 99.9 %   Patient temperature 98.0    Allens test (pass/fail) PASS PASS    Comment: Performed at [facility], [place] [address] [place], [place] [address]  HIV Antibody (routine testing w rflx)     Status: None   Collection Time: 08/11/20  2:04 AM  Result Value Ref Range  HIV Screen 4th Generation wRfx Non Reactive Non Reactive    Comment: Performed at [facility], [place] [address], [place], [place] [address]  Acetaminophen level     Status: Abnormal   Collection Time: 08/11/20  2:05 AM  Result Value Ref Range   Acetaminophen (Tylenol), Serum 108 (H) 10 - 30 ug/mL    Comment: (NOTE) Therapeutic concentrations vary significantly. A range of 10-30 ug/mL  may be an effective concentration for many patients. However, some  are best treated at concentrations outside of this range. Acetaminophen concentrations >150 ug/mL at 4 hours after ingestion  and >50 ug/mL at 12 hours after ingestion are often  associated with  toxic reactions.  Performed at [facility], [place] [address] [place], [place] [address]   Urinalysis, Routine w reflex microscopic     Status: Abnormal   Collection Time: 08/11/20  2:15 AM  Result Value Ref Range   Color, Urine YELLOW YELLOW   APPearance CLEAR CLEAR   Specific Gravity, Urine 1.014 1.005 - 1.030   pH 7.0 5.0 - 8.0   Glucose, UA 50 (A) NEGATIVE mg/dL   Hgb urine dipstick NEGATIVE NEGATIVE   Bilirubin Urine NEGATIVE NEGATIVE   Ketones, ur NEGATIVE NEGATIVE mg/dL   Protein, ur NEGATIVE NEGATIVE mg/dL   Nitrite NEGATIVE NEGATIVE   Leukocytes,Ua NEGATIVE NEGATIVE    Comment: Performed at [place] [address] [place], [place] [address]  Urinalysis, Routine w reflex microscopic     Status: Abnormal   Collection Time: 08/11/20  2:48 AM  Result Value Ref Range   Color, Urine STRAW (A) YELLOW   APPearance CLEAR CLEAR   Specific Gravity, Urine 1.028 1.005 - 1.030   pH 7.0 5.0 - 8.0   Glucose, UA >=500 (A) NEGATIVE mg/dL   Hgb urine dipstick NEGATIVE NEGATIVE   Bilirubin Urine NEGATIVE NEGATIVE   Ketones, ur 80 (A) NEGATIVE mg/dL   Protein, ur NEGATIVE NEGATIVE mg/dL   Nitrite NEGATIVE NEGATIVE   Leukocytes,Ua NEGATIVE NEGATIVE   RBC / HPF 0-5 0 - 5 RBC/hpf   WBC, UA 0-5 0 - 5 WBC/hpf   Bacteria, UA NONE SEEN NONE SEEN    Comment: Performed at [facility], [place] [address] [place], [place] [address]  Blood gas, arterial     Status: Abnormal   Collection Time: 08/11/20  3:48 AM  Result Value Ref Range   FIO2 50.00    pH, Arterial 7.354 7.350 - 7.450   pCO2 arterial 42.7 32.0 - 48.0 mmHg   pO2, Arterial 207 (H) 83.0 - 108.0 mmHg   Bicarbonate 23.4 20.0 - 28.0 mmol/L   Acid-base deficit 1.9 0.0 - 2.0 mmol/L   O2 Saturation 99.6 %   Patient temperature 97.2    Allens test (pass/fail) PASS PASS    Comment: Performed at [facility], [place] [address] [place],  [place] [address]  CBC     Status: Abnormal   Collection Time: 08/11/20  4:25 AM  Result Value Ref Range   WBC 10.3 4.0 - 10.5 K/uL   RBC 4.41 4.22 - 5.81 MIL/uL   Hemoglobin 13.1 13.0 - 17.0 g/dL   HCT 38.2 (L) 39.0 - 52.0 %   MCV 86.6 80.0 - 100.0 fL   MCH 29.7 26.0 - 34.0 pg   MCHC 34.3 30.0 - 36.0 g/dL   RDW 11.9 11.5 - 15.5 %   Platelets 125 (L) 150 - 400 K/uL    Comment:  CONSISTENT WITH PREVIOUS RESULT   nRBC 0.0 0.0 - 0.2 %    Comment: Performed at [facility], [place] [address], [place], [place] [address]  Magnesium     Status: None   Collection Time: 08/11/20  4:25 AM  Result Value Ref Range   Magnesium 1.9 1.7 - 2.4 mg/dL    Comment: Performed at [facility], [place] [address] [place], [place] [address]  Phosphorus     Status: Abnormal   Collection Time: 08/11/20  4:25 AM  Result Value Ref Range   Phosphorus 2.4 (L) 2.5 - 4.6 mg/dL    Comment: Performed at [facility], [place] [address] [place], [place] [address]  MRSA PCR Screening     Status: None   Collection Time: 08/11/20  6:01 AM   Specimen: Nasopharyngeal  Result Value Ref Range   MRSA by PCR NEGATIVE NEGATIVE    Comment:        The GeneXpert MRSA Assay (FDA approved for NASAL specimens only), is one component of a comprehensive MRSA colonization surveillance program. It is not intended to diagnose MRSA infection nor to guide or monitor treatment for MRSA infections. Performed at [facility], [place] [address] [place], [place] [address]   Comprehensive metabolic panel     Status: Abnormal   Collection Time: 08/11/20  7:13 AM  Result Value Ref Range   Sodium 136 135 - 145 mmol/L   Potassium 3.4 (L) 3.5 - 5.1 mmol/L    Comment: DELTA CHECK NOTED NO VISIBLE HEMOLYSIS    Chloride 105 98 - 111 mmol/L   CO2 21 (L) 22 - 32 mmol/L   Glucose, Bld 153 (H) 70 - 99 mg/dL    Comment: Glucose reference range applies only to samples taken after  fasting for at least 8 hours.   BUN 7 6 - 20 mg/dL   Creatinine, Ser 0.62 0.61 - 1.24 mg/dL   Calcium 7.9 (L) 8.9 - 10.3 mg/dL   Total Protein 6.6 6.5 - 8.1 g/dL   Albumin 3.7 3.5 - 5.0 g/dL   AST 21 15 - 41 U/L   ALT 32 0 - 44 U/L   Alkaline Phosphatase 26 (L) 38 - 126 U/L   Total Bilirubin 1.0 0.3 - 1.2 mg/dL   GFR, Estimated >60 >60 mL/min    Comment: (NOTE) Calculated using the CKD-EPI Creatinine Equation (2021)    Anion gap 10 5 - 15    Comment: Performed at [facility], [place] [address] [place], [place] [address]  Magnesium     Status: None   Collection Time: 08/11/20  7:13 AM  Result Value Ref Range   Magnesium 2.4 1.7 - 2.4 mg/dL    Comment: Performed at [facility], [place] [address] [place], [place] [address]  Protime-INR     Status: None   Collection Time: 08/11/20  7:13 AM  Result Value Ref Range   Prothrombin Time 14.5 11.4 - 15.2 seconds   INR 1.2 0.8 - 1.2    Comment: (NOTE) INR goal varies based on device and disease states. Performed at [facility], [place] [address], [place], [place] [address]   APTT     Status: None   Collection Time: 08/11/20  7:13 AM  Result Value Ref Range   aPTT 27 24 - 36 seconds    Comment: Performed at [facility], [place] [address], [place],  [address]  Acetaminophen level  Status: None   Collection Time: 08/11/20 11:34 AM  Result Value Ref Range   Acetaminophen (Tylenol), Serum 17 10 - 30 ug/mL    Comment: (NOTE) Therapeutic concentrations vary significantly. A range of 10-30 ug/mL  may be an effective concentration for many patients. However, some  are best treated at concentrations outside of this range. Acetaminophen concentrations >150 ug/mL at 4 hours after ingestion  and >50 ug/mL at 12 hours after ingestion are often associated with  toxic reactions.  Performed at [facility], [place] [address], [place], [place]  [address]   Comprehensive metabolic panel     Status: Abnormal   Collection Time: 08/11/20 11:34 AM  Result Value Ref Range   Sodium 146 (H) 135 - 145 mmol/L    Comment: DELTA CHECK NOTED   Potassium 3.1 (L) 3.5 - 5.1 mmol/L   Chloride 108 98 - 111 mmol/L   CO2 21 (L) 22 - 32 mmol/L   Glucose, Bld 245 (H) 70 - 99 mg/dL    Comment: Glucose reference range applies only to samples taken after fasting for at least 8 hours.   BUN 6 6 - 20 mg/dL   Creatinine, Ser 0.65 0.61 - 1.24 mg/dL   Calcium 8.1 (L) 8.9 - 10.3 mg/dL   Total Protein 6.4 (L) 6.5 - 8.1 g/dL   Albumin 3.5 3.5 - 5.0 g/dL   AST 18 15 - 41 U/L   ALT 29 0 - 44 U/L   Alkaline Phosphatase 23 (L) 38 - 126 U/L   Total Bilirubin 0.9 0.3 - 1.2 mg/dL   GFR, Estimated >60 >60 mL/min    Comment: (NOTE) Calculated using the CKD-EPI Creatinine Equation (2021)    Anion gap 17 (H) 5 - 15    Comment: Performed at [facility], [place] [address] [place], [place] [address]  Lactic acid, plasma     Status: None   Collection Time: 08/11/20 11:34 AM  Result Value Ref Range   Lactic Acid, Venous 0.7 0.5 - 1.9 mmol/L    Comment: Performed at [facility], [place] [address] [place], [place] [address]  Protime-INR     Status: Abnormal   Collection Time: 08/11/20 11:34 AM  Result Value Ref Range   Prothrombin Time 21.2 (H) 11.4 - 15.2 seconds   INR 1.9 (H) 0.8 - 1.2    Comment: (NOTE) INR goal varies based on device and disease states. Performed at [facility], [place] [address] [place], [place] [address]   CBC     Status: Abnormal   Collection Time: 08/11/20 11:34 AM  Result Value Ref Range   WBC 9.1 4.0 - 10.5 K/uL   RBC 4.43 4.22 - 5.81 MIL/uL   Hemoglobin 12.9 (L) 13.0 - 17.0 g/dL   HCT 37.8 (L) 39.0 - 52.0 %   MCV 85.3 80.0 - 100.0 fL   MCH 29.1 26.0 - 34.0 pg   MCHC 34.1 30.0 - 36.0 g/dL   RDW 11.9 11.5 - 15.5 %   Platelets 124 (L) 150 - 400 K/uL   nRBC 0.0 0.0 - 0.2 %     Comment: Performed at [facility], [place] [address] [place], [place] [address]  Magnesium     Status: None   Collection Time: 08/11/20 11:34 AM  Result Value Ref Range   Magnesium 2.1 1.7 - 2.4 mg/dL    Comment: Performed at [facility], [place] [address], [place], [place] [address]  Phosphorus     Status: Abnormal   Collection Time: 08/11/20 11:34 AM  Result Value Ref Range   Phosphorus 1.8 (L) 2.5 - 4.6 mg/dL    Comment: Performed at [facility], [place] [address] [place],  [address]  Procalcitonin - Baseline     Status: None   Collection Time: 08/11/20 11:34 AM  Result Value Ref Range   Procalcitonin <0.10 ng/mL    Comment:        Interpretation: PCT (Procalcitonin) <= 0.5 ng/mL: Systemic infection (sepsis) is not likely. Local bacterial infection is possible. (NOTE)       Sepsis PCT Algorithm           Lower Respiratory Tract                                      Infection PCT Algorithm    ----------------------------     ----------------------------         PCT < 0.25 ng/mL                PCT < 0.10 ng/mL          Strongly encourage             Strongly discourage   discontinuation of antibiotics    initiation of antibiotics    ----------------------------     -----------------------------       PCT 0.25 - 0.50 ng/mL            PCT 0.10 - 0.25 ng/mL               OR       >80% decrease in PCT            Discourage initiation of                                            antibiotics      Encourage discontinuation           of antibiotics    ----------------------------     -----------------------------         PCT >= 0.50 ng/mL              PCT 0.26 - 0.50 ng/mL               AND        <80% decrease in PCT             Encourage initiation of                                             antibiotics       Encourage continuation           of antibiotics    ----------------------------      -----------------------------        PCT >= 0.50 ng/mL                  PCT > 0.50 ng/mL               AND         increase in PCT  Strongly encourage                                      initiation of antibiotics    Strongly encourage escalation           of antibiotics                                     -----------------------------                                           PCT <= 0.25 ng/mL                                                 OR                                        > 80% decrease in PCT                                      Discontinue / Do not initiate                                             antibiotics  Performed at [place] [address] [place], [place] [address]   Acetaminophen level     Status: Abnormal   Collection Time: 08/11/20  8:48 PM  Result Value Ref Range   Acetaminophen (Tylenol), Serum <10 (L) 10 - 30 ug/mL    Comment: (NOTE) Therapeutic concentrations vary significantly. A range of 10-30 ug/mL  may be an effective concentration for many patients. However, some  are best treated at concentrations outside of this range. Acetaminophen concentrations >150 ug/mL at 4 hours after ingestion  and >50 ug/mL at 12 hours after ingestion are often associated with  toxic reactions.  Performed at [facility], [place] [address], [place], [place] [address]   CBC     Status: Abnormal   Collection Time: 08/11/20  8:48 PM  Result Value Ref Range   WBC 12.0 (H) 4.0 - 10.5 K/uL   RBC 4.58 4.22 - 5.81 MIL/uL   Hemoglobin 13.5 13.0 - 17.0 g/dL   HCT 40.7 39.0 - 52.0 %   MCV 88.9 80.0 - 100.0 fL   MCH 29.5 26.0 - 34.0 pg   MCHC 33.2 30.0 - 36.0 g/dL   RDW 12.2 11.5 - 15.5 %   Platelets 126 (L) 150 - 400 K/uL   nRBC 0.0 0.0 - 0.2 %    Comment: Performed at [facility], [place] [address] [place], [place] [address]  Comprehensive metabolic panel     Status: Abnormal   Collection  Time: 08/11/20  8:48 PM  Result Value Ref Range   Sodium 144 135 - 145 mmol/L  Potassium 3.8 3.5 - 5.1 mmol/L    Comment: DELTA CHECK NOTED   Chloride 111 98 - 111 mmol/L   CO2 25 22 - 32 mmol/L   Glucose, Bld 115 (H) 70 - 99 mg/dL    Comment: Glucose reference range applies only to samples taken after fasting for at least 8 hours.   BUN 6 6 - 20 mg/dL   Creatinine, Ser 0.76 0.61 - 1.24 mg/dL   Calcium 8.6 (L) 8.9 - 10.3 mg/dL   Total Protein 6.6 6.5 - 8.1 g/dL   Albumin 3.7 3.5 - 5.0 g/dL   AST 20 15 - 41 U/L   ALT 29 0 - 44 U/L   Alkaline Phosphatase 27 (L) 38 - 126 U/L   Total Bilirubin 1.0 0.3 - 1.2 mg/dL   GFR, Estimated >60 >60 mL/min    Comment: (NOTE) Calculated using the CKD-EPI Creatinine Equation (2021)    Anion gap 8 5 - 15    Comment: Performed at [facility], [place] [address], [place], [place] [address]  Basic metabolic panel     Status: Abnormal   Collection Time: 08/12/20  2:39 AM  Result Value Ref Range   Sodium 138 135 - 145 mmol/L   Potassium 3.8 3.5 - 5.1 mmol/L   Chloride 109 98 - 111 mmol/L   CO2 22 22 - 32 mmol/L   Glucose, Bld 110 (H) 70 - 99 mg/dL    Comment: Glucose reference range applies only to samples taken after fasting for at least 8 hours.   BUN 6 6 - 20 mg/dL   Creatinine, Ser 0.68 0.61 - 1.24 mg/dL   Calcium 8.5 (L) 8.9 - 10.3 mg/dL   GFR, Estimated >60 >60 mL/min    Comment: (NOTE) Calculated using the CKD-EPI Creatinine Equation (2021)    Anion gap 7 5 - 15    Comment: Performed at [facility], [place] [address], [place], [place] [address]  Acetaminophen level     Status: Abnormal   Collection Time: 08/12/20  2:39 AM  Result Value Ref Range   Acetaminophen (Tylenol), Serum <10 (L) 10 - 30 ug/mL    Comment: (NOTE) Therapeutic concentrations vary significantly. A range of 10-30 ug/mL  may be an effective concentration for many patients. However, some  are best treated at concentrations outside of  this range. Acetaminophen concentrations >150 ug/mL at 4 hours after ingestion  and >50 ug/mL at 12 hours after ingestion are often associated with  toxic reactions.  Performed at [facility], [place] [address], [place], [place] [address]   Lactic acid, plasma     Status: None   Collection Time: 08/12/20  2:39 AM  Result Value Ref Range   Lactic Acid, Venous 0.8 0.5 - 1.9 mmol/L    Comment: Performed at [facility], [place] [address] [place], [place] [address]  Protime-INR     Status: None   Collection Time: 08/12/20  2:39 AM  Result Value Ref Range   Prothrombin Time 14.9 11.4 - 15.2 seconds   INR 1.2 0.8 - 1.2    Comment: (NOTE) INR goal varies based on device and disease states. Performed at [facility], [place] [address] [place], [place] [address]   Magnesium     Status: None   Collection Time: 08/12/20  2:39 AM  Result Value Ref Range   Magnesium 2.0 1.7 - 2.4 mg/dL    Comment: Performed at [facility], [address] [place], [place] [address]  Procalcitonin     Status: None   Collection Time: 08/12/20  2:39 AM  Result Value Ref Range   Procalcitonin <0.10 ng/mL    Comment:        Interpretation: PCT (Procalcitonin) <= 0.5 ng/mL: Systemic infection (sepsis) is not likely. Local bacterial infection is possible. (NOTE)       Sepsis PCT Algorithm           Lower Respiratory Tract                                      Infection PCT Algorithm    ----------------------------     ----------------------------         PCT < 0.25 ng/mL                PCT < 0.10 ng/mL          Strongly encourage             Strongly discourage   discontinuation of antibiotics    initiation of antibiotics    ----------------------------     -----------------------------       PCT 0.25 - 0.50 ng/mL            PCT 0.10 - 0.25 ng/mL               OR       >80% decrease in PCT            Discourage initiation of                                             antibiotics      Encourage discontinuation           of antibiotics    ----------------------------     -----------------------------         PCT >= 0.50 ng/mL              PCT 0.26 - 0.50 ng/mL               AND        <80% decrease in PCT             Encourage initiation of                                             antibiotics       Encourage continuation           of antibiotics    ----------------------------     -----------------------------        PCT >= 0.50 ng/mL                  PCT > 0.50 ng/mL               AND         increase in PCT                  Strongly encourage  initiation of antibiotics    Strongly encourage escalation           of antibiotics                                     -----------------------------                                           PCT <= 0.25 ng/mL                                                 OR                                        > 80% decrease in PCT                                      Discontinue / Do not initiate                                             antibiotics  Performed at [place] [address], [place], [place] [address]     Current Facility-Administered Medications  Medication Dose Route Frequency Provider Last Rate Last Admin  . 0.9 %  sodium chloride infusion   Intravenous Continuous Kc, Ramesh, MD 50 mL/hr at 08/12/20 1044 Infusion Verify at 08/12/20 1044  . chlorhexidine gluconate (MEDLINE KIT) (PERIDEX) 0.12 % solution 15 mL  15 mL Mouth Rinse BID Renee Pain, MD   15 mL at 08/12/20 0718  . Chlorhexidine Gluconate Cloth 2 % PADS 6 each  6 each Topical Daily Renee Pain, MD   6 each at 08/11/20 2100  . docusate sodium (COLACE) capsule 100 mg  100 mg Oral BID PRN Renee Pain, MD      . famotidine (PEPCID) IVPB 20 mg premix  20 mg Intravenous Q12H Renee Pain, MD   Stopped at 08/12/20 1030  . heparin  injection 5,000 Units  5,000 Units Subcutaneous Q8H Renee Pain, MD   5,000 Units at 08/12/20 0513  . hydrALAZINE (APRESOLINE) tablet 25 mg  25 mg Oral Q6H PRN Kc, Maren Beach, MD      . ibuprofen (ADVIL) tablet 400 mg  400 mg Oral Q6H PRN Kc, Ramesh, MD   400 mg at 08/12/20 1048  . lip balm (CARMEX) ointment   Topical PRN Anders Simmonds, MD   1 application at 17/00/17 2334  . polyethylene glycol (MIRALAX / GLYCOLAX) packet 17 g  17 g Oral Daily PRN Renee Pain, MD        Musculoskeletal: Strength & Muscle Tone: within normal limits Gait & Station: normal Patient leans: N/A    Psychiatric Specialty Exam:  Presentation  General Appearance: Appropriate for Environment  Eye Contact:Fair  Speech:Clear and Coherent; Slow  Speech Volume:Normal  Handedness:Right   Mood and Affect  Mood:Depressed; Dysphoric; Worthless; Hopeless  Affect:Depressed; Restricted   Thought Process  Thought Processes:Linear  Descriptions of Associations:Intact  Orientation:Full (Time, Place and Person)  Thought Content:Logical  History of Schizophrenia/Schizoaffective disorder:No data recorded Duration of Psychotic Symptoms:No data recorded Hallucinations:Hallucinations: None  Ideas of Reference:None  Suicidal Thoughts:Suicidal Thoughts: Yes, Active SI Active Intent and/or Plan: With Intent; With Plan; With Means to [place]; With Access to Means  Homicidal Thoughts:Homicidal Thoughts: No   Sensorium  Memory:Immediate Fair; Recent Good; Remote Good  Judgment:Fair  Insight:Fair   Executive Functions  Concentration:Fair  Attention Span:Fair  [place]   Psychomotor Activity  Psychomotor Activity:Psychomotor Activity: Psychomotor Retardation   Assets  Assets:Communication Skills; Desire for Improvement; Housing; Leisure Time; Social Support; Physical Health   Sleep  Sleep:Sleep: Fair   Physical Exam: Physical  Exam ROS Blood pressure (!) 126/98, pulse 93, temperature 98.7 F (37.1 C), temperature source Axillary, resp. rate 13, height _0  (1.778 m), weight 95.8 kg, SpO2 96 %. Body mass index is 30.3 kg/m.  Treatment Plan Summary: Plan Recommend inpatient psychiatry once medically stable -Will not resume his home medications at this time due to the severity of his overdose.  - EKG obtained on 03/09, QTc 467.  Recommend working closely with SW to facilitate inpatient psychiatric admission once he is medically stable. Please contact the [facility] for information regarding bed availability, if no beds are available please refer out of system.  Continue IVC, and safety sitter at this time.   -Psychiatry to sign off at this time. Please reconsult if warranted.   Disposition: Recommend psychiatric Inpatient admission when medically cleared.  Suella Broad, FNP 08/12/2020 12:48 PM

## 2020-08-12 NOTE — Progress Notes (Signed)
OT Cancellation Note  Patient Details Name: Johnny Delgado MRN: [id number] DOB: July 04, 1995   Cancelled Treatment:    Reason Eval/Treat Not Completed: OT screened, no needs identified, will sign off. OT observed patient get out of bed and transfer into chair without any physical assistance. Discussed with RN who agrees patient does not have acute OT needs at this time. Please re-consult if new needs arise.  Marlyce Huge OT OT pager: [phone]   Carmelia Roller 08/12/2020, 10:01 AM

## 2020-08-12 NOTE — Progress Notes (Signed)
eLink Physician-Brief Progress Note Patient Name: Johnny Delgado DOB: Oct 04, 1995 MRN: [id number]   Date of Service  08/12/2020  HPI/Events of Note  Request to renew suicide safety sitter.   eICU Interventions  Will renew suicide safety sitter X 12 hours.     Intervention Category Major Interventions: Other:  Lenell Antu 08/12/2020, 5:53 AM

## 2020-08-12 NOTE — Progress Notes (Signed)
PT Cancellation Note  Patient Details Name: [name] MRN: [id number] DOB: 06-19-1995   Cancelled Treatment:    Reason Eval/Treat Not Completed: PT screened, no needs identified, will sign off , patioent observed up without assistance, RN agreed, no PT needs.   Rada Hay 08/12/2020, 9:15 AM Blanchard Kelch PT Acute Rehabilitation Services Pager [phone] Office [phone]

## 2020-08-12 NOTE — Progress Notes (Signed)
Chaplain engaged in a follow-up visit with Lonni Fix.  When chaplain asked him how he was doing he expressed feeling shame and remorse.  He verbalized thinking about the ways that he could have hurt his family.  He stated that he just didn't know what to do.  Doyel also brought up how he is in a place of curiosity regarding his beliefs which chaplain could assess as being hard for him in a very religious family.    Chaplain worked to affirm [employer] and where he is today.  Fadel appears to care deeply about his family and what they think and believe.      08/12/20 1300  Clinical Encounter Type  Visited With Patient and family together  Visit Type Follow-up

## 2020-08-12 NOTE — Evaluation (Signed)
Clinical/Bedside Swallow Evaluation Patient Details  Name: Johnny Delgado MRN: [id number] Date of Birth: 1995/10/05  Today's Date: 08/12/2020 Time: SLP Start Time (ACUTE ONLY): 0930 SLP Stop Time (ACUTE ONLY): 0955 SLP Time Calculation (min) (ACUTE ONLY): 25 min  Past Medical History:  Past Medical History:  Diagnosis Date  . ADHD (attention deficit hyperactivity disorder)   . Anxiety   . Depression    Past Surgical History:  Past Surgical History:  Procedure Laterality Date  . WISDOM TOOTH EXTRACTION Bilateral 2015   All 4 extracted   HPI:  24yo male admitted 08/10/20 following drug overdose. Pt was intubated in ED for AMS and airway protection, extubated 08/11/20 @ 1815. PMH: ADHD, anxiety/depression   Assessment / Plan / Recommendation Clinical Impression  Pt presents with functional oropharyngeal swallow. CN exam is unremarkable, and pt reports no history of swallowing difficulty. Pt has adequate dentition, and tolerated trials of thin liquid, puree, and solid textures without oral issues or overt s/s aspiration. Pt reports the NGT is uncomfortable, and that it feels as if it is progressing farther down inside him. RN informed. Will begin regular diet and thin liquids, no ST follow up. Please reconsult if needs arise.   SLP Visit Diagnosis: Dysphagia, unspecified (R13.10)    Aspiration Risk  Mild aspiration risk    Diet Recommendation Thin liquid;Regular   Liquid Administration via: Cup;Straw Medication Administration: Whole meds with liquid Supervision: Patient able to self feed Postural Changes: Seated upright at 90 degrees    Other  Recommendations Oral Care Recommendations: Oral care BID   Follow up Recommendations  none         Prognosis Prognosis for Safe Diet Advancement: Good      Swallow Study   General Date of Onset: 08/10/20 HPI: 24yo male admitted 08/10/20 following drug overdose. Pt was intubated in ED for AMS and airway protection, extubated 08/11/20 @  1815. PMH: ADHD, anxiety/depression Type of Study: Bedside Swallow Evaluation Previous Swallow Assessment: none Diet Prior to this Study: NPO Temperature Spikes Noted: No Respiratory Status: Room air History of Recent Intubation: Yes Length of Intubations (days): 1 days Date extubated: 08/11/20 Behavior/Cognition: Alert;Cooperative;Pleasant mood Oral Cavity Assessment: Within Functional Limits Oral Care Completed by SLP: No Oral Cavity - Dentition: Adequate natural dentition Vision: Functional for self-feeding Self-Feeding Abilities: Able to feed self Patient Positioning: Upright in chair Baseline Vocal Quality: Normal Volitional Cough: Strong Volitional Swallow: Able to elicit    Oral/Motor/Sensory Function Overall Oral Motor/Sensory Function: Within functional limits   Ice Chips Ice chips: Within functional limits Presentation: Spoon   Thin Liquid Thin Liquid: Within functional limits Presentation: Straw    Nectar Thick Nectar Thick Liquid: Not tested   Honey Thick Honey Thick Liquid: Not tested   Puree Puree: Within functional limits Presentation: Spoon;Self Fed   Solid     Solid: Within functional limits Presentation: Self Fed     Jasilyn Holderman B. Murvin Natal, [facility], CCC-SLP Speech Language Pathologist Office: [phone] Pager: [phone]  Leigh Aurora 08/12/2020,10:02 AM

## 2020-08-12 NOTE — Progress Notes (Signed)
PROGRESS NOTE    Johnny WELCHER  VOZ:[id number] DOB: [birth year] DOA: 08/10/2020 PCP: Damaris Hippo, MD (Inactive)   Chief Complaint  Patient presents with  . Drug Overdose   Brief Narrative: 25 year old male with stable anxiety, depression, ADHD admitted on 3/8 due to concern of overdose after EMS activated by her roommate with report of possible 150-200 (533m) tylenol tablets taken, 10-15 fluoxetine, 10-15 hydroxyzine.  He was witnessed by EMS to have emesis with pill fragments in it.  The patient was drowsy in the ER and intubated for airway protection.  He was given activated charcoal in the ER which resulted in immediate emesis.  Patient was admitted to ICU.  Patient was monitored closely for polysubstance overdose/major depression with ingestion of Prozac, hydroxyzine, Tylenol UDS positive for THC, EtOH negative, managed with NAC protocol, bicarb drip. Patient LFTs INR Tylenol level overall is stable and extubated 3[facility]  Subjective: AAOx3,NGT,no overnight vomiting No BM. feels depressed but not suicidal One to one at bedside   Assessment & Plan: Polysubstance abuse Major depression Overdose with ingested Prozac, hydroxyzine and acetaminophen S/p NAC protocol, status post activated charcoal. Tylenol level less than 10x2, INR improved to 1.9>1.2, off NAC.  Poison control was involved. LFTs normal AST ALT total bili, electrolytes stable. Psychiatry consulted, continue one-to-one  Acute respiratory insufficiency Intubated to protect airway in the setting of overdose NGT and Foley in place Extubated 3/9 and doing well, NG clamping, xray abd, SLP eval. Hopefully we can d/c NGT and start oral diet later today. D/C FOELY  Prolonged QTC: QTC 467 on 3/9.    Hypokalemia Hypomagnesemia Hypophosphatemia Electrolytes are improved.  Morbid Obesity with BMI 30: will benefit outpatient PCP follow-up and weight loss  Diet Order            Diet NPO  time specified  Diet effective now                 Nutrition Problem: Inadequate oral intake Etiology: inability to eat Signs/Symptoms: NPO status Interventions: Refer to RD note for recommendations Patient's Body mass index is 30.3 kg/m.  DVT prophylaxis: heparin injection 5,000 Units Start: 08/11/20 0600 SCDs Start: 08/11/20 0156 Code Status:   Code Status: Full Code  Family Communication: plan of care discussed with patient at bedside.  Status is: Inpatient Remains inpatient appropriate because:IV treatments appropriate due to intensity of illness or inability to take PO and Inpatient level of care appropriate due to severity of illness  Dispo: The patient is from: Home              Anticipated d/c is to: TBD, PENDING PSYCH EVAL.              Patient currently is not medically stable to d/c.   Difficult to place patient No   Unresulted Labs (From admission, onward)          Start     Ordered   08/12/20 0500  Procalcitonin  Daily,   R     Question:  Specimen collection method  Answer:  Lab=Lab collect   08/11/20 1052          Medications reviewed:  Scheduled Meds: . chlorhexidine gluconate (MEDLINE KIT)  15 mL Mouth Rinse BID  . Chlorhexidine Gluconate Cloth  6 each Topical Daily  . heparin  5,000 Units Subcutaneous Q8H  . mouth rinse  15 mL Mouth Rinse 10 times per day  . potassium chloride  80 mEq Oral  BID   Continuous Infusions: . 0.9 % NaCl with KCl 40 mEq / L 50 mL/hr at 08/12/20 0200  . famotidine (PEPCID) IV Stopped (08/11/20 2257)  . sodium bicarbonate (isotonic) 150 mEq in D5W 1000 mL infusion 50 mL/hr at 08/12/20 0200    Consultants:see note  Procedures:see note  Antimicrobials: Anti-infectives (From admission, onward)   None     Culture/Microbiology    Component Value Date/Time   SDES BLOOD LEFT ANTECUBITAL 07/13/2019 2152   [place]  07/13/2019 2152    BOTTLES DRAWN AEROBIC AND ANAEROBIC Blood Culture results may not be optimal due to  an inadequate volume of blood received in culture bottles   CULT  07/13/2019 2152    NO GROWTH 5 DAYS Performed at [facility] [address] [place], [place] [address]    REPTSTATUS 07/18/2019 FINAL 07/13/2019 2152    Other culture-see note  Objective: Vitals: Today's Vitals   08/12/20 0300 08/12/20 0400 08/12/20 0500 08/12/20 0700  BP: (!) 144/79 (!) 145/85 (!) 151/89 (!) 158/102  Pulse: 92 (!) 108 82 99  Resp: 20 20 18 18   Temp:  99.4 F (37.4 C)    TempSrc:  Oral    SpO2: 93% 92% 95% 98%  Weight:      Height:      PainSc:        Intake/Output Summary (Last 24 hours) at 08/12/2020 0725 Last data filed at 08/12/2020 0200 Gross per 24 hour  Intake 3652.27 ml  Output 3250 ml  Net 402.27 ml   Filed Weights   08/10/20 2333 08/11/20 0400  Weight: 99.8 kg 95.8 kg   Weight change:   Intake/Output from previous day: 03/09 0701 - 03/10 0700 In: 3652.3 [P.O.:240; I.V.:3024.7; IV Piggyback:387.5] Out: 3250 [Urine:2400; Emesis/NG output:850] Intake/Output this shift: No intake/output data recorded. Filed Weights   08/10/20 2333 08/11/20 0400  Weight: 99.8 kg 95.8 kg    Examination:  General exam: AAOx3,NAD,weak appearing. HEENT:NGT +,Oral mucosa moist, Ear/Nose WNL grossly,dentition normal. Respiratory system: bilaterally diminished,no use of accessory muscle, non tender. Cardiovascular system: S1 & S2 +, regular, No JVD. Gastrointestinal system: Abdomen soft, NT,ND, BS+. Nervous System:Alert, awake, moving extremities and grossly nonfocal Extremities: No edema, distal peripheral pulses palpable.  Skin: No rashes,no icterus. MSK: Normal muscle bulk,tone, power  Data Reviewed: I have personally reviewed following labs and imaging studies CBC: Recent Labs  Lab 08/10/20 2324 08/11/20 0425 08/11/20 1134 08/11/20 2048  WBC 7.3 10.3 9.1 12.0*  NEUTROABS 3.0  --   --   --   HGB 14.2 13.1 12.9* 13.5  HCT 42.6 38.2* 37.8* 40.7  MCV 86.9 86.6 85.3 88.9  PLT  125* 125* 124* 419*   Basic Metabolic Panel: Recent Labs  Lab 08/10/20 2324 08/10/20 2330 08/11/20 0425 08/11/20 0713 08/11/20 1134 08/11/20 2048 08/12/20 0239  NA 137  --   --  136 146* 144 138  K 2.8*  --   --  3.4* 3.1* 3.8 3.8  CL 105  --   --  105 108 111 109  CO2 20*  --   --  21* 21* 25 22  GLUCOSE 143*  --   --  153* 245* 115* 110*  BUN 9  --   --  7 6 6 6   CREATININE 0.93  --   --  0.62 0.65 0.76 0.68  CALCIUM 9.2  --   --  7.9* 8.1* 8.6* 8.5*  MG  --  2.0 1.9 2.4 2.1  --  2.0  PHOS  --   --  2.4*  --  1.8*  --   --    GFR: Estimated Creatinine Clearance: 166.8 mL/min (by C-G formula based on SCr of 0.68 mg/dL). Liver Function Tests: Recent Labs  Lab 08/10/20 2324 08/11/20 0713 08/11/20 1134 08/11/20 2048  AST 34 21 18 20   ALT 37 32 29 29  ALKPHOS 48 26* 23* 27*  BILITOT 0.7 1.0 0.9 1.0  PROT 7.8 6.6 6.4* 6.6  ALBUMIN 4.5 3.7 3.5 3.7   No results for input(s): LIPASE, AMYLASE in the last 168 hours. No results for input(s): AMMONIA in the last 168 hours. Coagulation Profile: Recent Labs  Lab 08/11/20 0713 08/11/20 1134 08/12/20 0239  INR 1.2 1.9* 1.2   Cardiac Enzymes: No results for input(s): CKTOTAL, CKMB, CKMBINDEX, TROPONINI in the last 168 hours. BNP (last 3 results) No results for input(s): PROBNP in the last 8760 hours. HbA1C: No results for input(s): HGBA1C in the last 72 hours. CBG: Recent Labs  Lab 08/10/20 2332  GLUCAP 134*   Lipid Profile: No results for input(s): CHOL, HDL, LDLCALC, TRIG, CHOLHDL, LDLDIRECT in the last 72 hours. Thyroid Function Tests: No results for input(s): TSH, T4TOTAL, FREET4, T3FREE, THYROIDAB in the last 72 hours. Anemia Panel: No results for input(s): VITAMINB12, FOLATE, FERRITIN, TIBC, IRON, RETICCTPCT in the last 72 hours. Sepsis Labs: Recent Labs  Lab 08/11/20 1134 08/12/20 0239  PROCALCITON <0.10 <0.10  LATICACIDVEN 0.7 0.8    Recent Results (from the past 240 hour(s))  Resp Panel by RT-PCR  (Flu A&B, Covid) Nasopharyngeal Swab     Status: None   Collection Time: 08/10/20 11:53 PM   Specimen: Nasopharyngeal Swab; Nasopharyngeal(NP) swabs in vial transport medium  Result Value Ref Range Status   SARS Coronavirus 2 by RT PCR NEGATIVE NEGATIVE Final    Comment: (NOTE) SARS-CoV-2 target nucleic acids are NOT DETECTED.  The SARS-CoV-2 RNA is generally detectable in upper respiratory specimens during the acute phase of infection. The lowest concentration of SARS-CoV-2 viral copies this assay can detect is 138 copies/mL. A negative result does not preclude SARS-Cov-2 infection and should not be used as the sole basis for treatment or other patient management decisions. A negative result may occur with  improper specimen collection/handling, submission of specimen other than nasopharyngeal swab, presence of viral mutation(s) within the areas targeted by this assay, and inadequate number of viral copies(<138 copies/mL). A negative result must be combined with clinical observations, patient history, and epidemiological information. The expected result is Negative.  Fact Sheet for Patients:  EntrepreneurPulse.com.au  Fact Sheet for Healthcare Providers:  IncredibleEmployment.be  This test is no t yet approved or cleared by the [place] FDA and  has been authorized for detection and/or diagnosis of SARS-CoV-2 by FDA under an Emergency Use Authorization (EUA). This EUA will remain  in effect (meaning this test can be used) for the duration of the COVID-19 declaration under Section 564(b)(1) of the Act, 21 U.S.C.section 360bbb-3(b)(1), unless the authorization is terminated  or revoked sooner.       Influenza A by PCR NEGATIVE NEGATIVE Final   Influenza B by PCR NEGATIVE NEGATIVE Final    Comment: (NOTE) The Xpert Xpress SARS-CoV-2/FLU/RSV plus assay is intended as an aid in the diagnosis of influenza from Nasopharyngeal swab specimens  and should not be used as a sole basis for treatment. Nasal washings and aspirates are unacceptable for Xpert Xpress SARS-CoV-2/FLU/RSV testing.  Fact Sheet for Patients: EntrepreneurPulse.com.au  Fact Sheet  for Healthcare Providers: IncredibleEmployment.be  This test is not yet approved or cleared by the [place] and has been authorized for detection and/or diagnosis of SARS-CoV-2 by FDA under an Emergency Use Authorization (EUA). This EUA will remain in effect (meaning this test can be used) for the duration of the COVID-19 declaration under Section 564(b)(1) of the Act, 21 U.S.C. section 360bbb-3(b)(1), unless the authorization is terminated or revoked.  Performed at [facility], [place] [address] [place], [place] [address]   MRSA PCR Screening     Status: None   Collection Time: 08/11/20  6:01 AM   Specimen: Nasopharyngeal  Result Value Ref Range Status   MRSA by PCR NEGATIVE NEGATIVE Final    Comment:        The GeneXpert MRSA Assay (FDA approved for NASAL specimens only), is one component of a comprehensive MRSA colonization surveillance program. It is not intended to diagnose MRSA infection nor to guide or monitor treatment for MRSA infections. Performed at [facility], [place] [address], [place], [place] [address]      Radiology Studies: DG Abdomen 1 View  Result Date: 08/11/2020 CLINICAL DATA:  NG tube placement EXAM: ABDOMEN - 1 VIEW COMPARISON:  CT 07/13/2019. FINDINGS: Transesophageal tube tip and side port terminate distal to the GE junction. Lung bases are clear. There is a relative paucity of upper abdominal bowel gas which is a nonspecific finding which can be seen both is a normal finding or in the setting of obstruction, correlate with abdominal findings. No visible subdiaphragmatic free air on this semi upright exam. No worrisome calcifications. IMPRESSION: 1. Transesophageal  tube tip and side port terminate distal to the GE junction. 2. Paucity of upper abdominal bowel gas, a nonspecific finding. 3. No visible subdiaphragmatic free air on this semi upright exam. Electronically Signed   By: Lovena Le M.D.   On: 08/11/2020 00:28   DG Chest Port 1 View  Result Date: 08/12/2020 CLINICAL DATA:  25 year old male status post overdose.  Extubated. EXAM: PORTABLE CHEST 1 VIEW COMPARISON:  Portable chest 08/11/2020 and earlier. FINDINGS: Enteric tube position not significantly changed. Extubated. Low but mildly improved lung volumes compared to yesterday. Mediastinal contours remain within normal limits. No pneumothorax, pulmonary edema or pleural effusion. Streaky perihilar opacity, likely atelectasis, has decreased but not resolved. No areas of worsening ventilation. No osseous abnormality identified. Paucity of bowel gas in the upper abdomen. IMPRESSION: 1. Extubated with mildly improved lung volumes and decreased perihilar atelectasis. No new cardiopulmonary abnormality. 2. Stable enteric tube. Electronically Signed   By: Genevie Ann M.D.   On: 08/12/2020 05:09   DG Chest Portable 1 View  Result Date: 08/11/2020 CLINICAL DATA:  Status post intubation EXAM: PORTABLE CHEST 1 VIEW COMPARISON:  Film from the previous day. FINDINGS: Endotracheal tube and gastric catheter are noted in satisfactory position. Cardiac shadow is within normal limits. Hypoinflation is again seen. The lungs are clear. No bony abnormality is noted. IMPRESSION: Tubes and lines are noted in satisfactory position. Overall poor inspiratory effort without acute abnormality. Electronically Signed   By: Inez Catalina M.D.   On: 08/11/2020 01:12   DG Chest Portable 1 View  Result Date: 08/10/2020 CLINICAL DATA:  Overdose EXAM: PORTABLE CHEST 1 VIEW COMPARISON:  Radiograph 07/13/2019 FINDINGS: Low volumes with hypoventilatory atelectatic changes and vascular crowding. No convincing features of frank edema. No pneumothorax  or visible effusion though portion of the right costophrenic sulcus is collimated. Cardiomediastinal contours are prominent  though possibly accentuated by portable technique and low volumes. Telemetry leads overlie the chest. No acute osseous or soft tissue abnormality. IMPRESSION: 1. Low lung volumes with hypoventilatory changes and vascular crowding. 2. Prominence of the cardiomediastinal contours, possibly accentuated by portable technique and low volumes. Electronically Signed   By: Lovena Le M.D.   On: 08/10/2020 23:49     LOS: 1 day   Antonieta Pert, MD [facility]  08/12/2020, 7:25 AM

## 2020-08-13 ENCOUNTER — Inpatient Hospital Stay (HOSPITAL_COMMUNITY)
Admission: AD | Admit: 2020-08-13 | Discharge: 2020-08-17 | DRG: 885 | Disposition: A | Discharge status: Home / Self Care | Payer: BC Managed Care – PPO | Source: Intra-hospital | Attending: Psychiatry | Admitting: Psychiatry

## 2020-08-13 ENCOUNTER — Other Ambulatory Visit: Payer: Self-pay

## 2020-08-13 ENCOUNTER — Encounter (HOSPITAL_COMMUNITY): Payer: Self-pay | Admitting: Psychiatry

## 2020-08-13 DIAGNOSIS — F419 Anxiety disorder, unspecified: Secondary | ICD-10-CM | POA: Diagnosis present

## 2020-08-13 DIAGNOSIS — T1491XA Suicide attempt, initial encounter: Secondary | ICD-10-CM | POA: Diagnosis present

## 2020-08-13 DIAGNOSIS — T6592XA Toxic effect of unspecified substance, intentional self-harm, initial encounter: Secondary | ICD-10-CM | POA: Diagnosis not present

## 2020-08-13 DIAGNOSIS — D696 Thrombocytopenia, unspecified: Secondary | ICD-10-CM | POA: Diagnosis present

## 2020-08-13 DIAGNOSIS — F900 Attention-deficit hyperactivity disorder, predominantly inattentive type: Secondary | ICD-10-CM | POA: Diagnosis present

## 2020-08-13 DIAGNOSIS — F411 Generalized anxiety disorder: Secondary | ICD-10-CM | POA: Diagnosis present

## 2020-08-13 DIAGNOSIS — F332 Major depressive disorder, recurrent severe without psychotic features: Principal | ICD-10-CM | POA: Diagnosis present

## 2020-08-13 DIAGNOSIS — Z818 Family history of other mental and behavioral disorders: Secondary | ICD-10-CM

## 2020-08-13 DIAGNOSIS — I10 Essential (primary) hypertension: Secondary | ICD-10-CM | POA: Diagnosis not present

## 2020-08-13 DIAGNOSIS — Z9151 Personal history of suicidal behavior: Secondary | ICD-10-CM

## 2020-08-13 LAB — CBC
HCT: 41 % (ref 39.0–52.0)
Hemoglobin: 13.4 g/dL (ref 13.0–17.0)
MCH: 28.9 pg (ref 26.0–34.0)
MCHC: 32.7 g/dL (ref 30.0–36.0)
MCV: 88.6 fL (ref 80.0–100.0)
Platelets: 112 10*3/uL — ABNORMAL LOW (ref 150–400)
RBC: 4.63 MIL/uL (ref 4.22–5.81)
RDW: 12 % (ref 11.5–15.5)
WBC: 9.2 10*3/uL (ref 4.0–10.5)
nRBC: 0 % (ref 0.0–0.2)

## 2020-08-13 LAB — COMPREHENSIVE METABOLIC PANEL
ALT: 23 U/L (ref 0–44)
AST: 17 U/L (ref 15–41)
Albumin: 3.8 g/dL (ref 3.5–5.0)
Alkaline Phosphatase: 35 U/L — ABNORMAL LOW (ref 38–126)
Anion gap: 10 (ref 5–15)
BUN: 8 mg/dL (ref 6–20)
CO2: 22 mmol/L (ref 22–32)
Calcium: 8.9 mg/dL (ref 8.9–10.3)
Chloride: 106 mmol/L (ref 98–111)
Creatinine, Ser: 0.67 mg/dL (ref 0.61–1.24)
GFR, Estimated: >60 mL/min (ref >60)
Glucose, Bld: 86 mg/dL (ref 70–99)
Potassium: 3.8 mmol/L (ref 3.5–5.1)
Sodium: 138 mmol/L (ref 135–145)
Total Bilirubin: 1 mg/dL (ref 0.3–1.2)
Total Protein: 7 g/dL (ref 6.5–8.1)

## 2020-08-13 LAB — MAGNESIUM: Magnesium: 2 mg/dL (ref 1.7–2.4)

## 2020-08-13 MED ORDER — HYDROXYZINE HCL 25 MG PO TABS
25.0000 mg | ORAL_TABLET | Freq: Three times a day (TID) | ORAL | Status: DC | PRN
  Administered 2020-08-15 – 2020-08-16 (×2): 25 mg via ORAL
  Filled 2020-08-13 (×3): qty 1

## 2020-08-13 MED ORDER — ONDANSETRON HCL 4 MG PO TABS: 8.0000 mg | ORAL_TABLET | Freq: Three times a day (TID) | ORAL | Status: DC | PRN

## 2020-08-13 MED ORDER — ALUM & MAG HYDROXIDE-SIMETH 200-200-20 MG/5ML PO SUSP: 30.0000 mL | ORAL | Status: DC | PRN

## 2020-08-13 MED ORDER — FAMOTIDINE 20 MG PO TABS
20.0000 mg | ORAL_TABLET | Freq: Two times a day (BID) | ORAL | Status: DC
  Administered 2020-08-13 – 2020-08-17 (×8): 20 mg via ORAL
  Filled 2020-08-13 (×14): qty 1

## 2020-08-13 MED ORDER — MAGNESIUM HYDROXIDE 400 MG/5ML PO SUSP: 30.0000 mL | Freq: Every day | ORAL | Status: DC | PRN

## 2020-08-13 MED ORDER — TRAZODONE HCL 50 MG PO TABS
50.0000 mg | ORAL_TABLET | Freq: Every evening | ORAL | Status: DC | PRN
  Administered 2020-08-13 – 2020-08-14 (×2): 50 mg via ORAL
  Filled 2020-08-13 (×3): qty 1

## 2020-08-13 MED ORDER — POLYETHYLENE GLYCOL 3350 17 G PO PACK: 17.0000 g | PACK | Freq: Every day | ORAL | Status: DC | PRN

## 2020-08-13 MED ORDER — IBUPROFEN 400 MG PO TABS
400.0000 mg | ORAL_TABLET | Freq: Four times a day (QID) | ORAL | Status: DC | PRN
  Administered 2020-08-13 – 2020-08-17 (×3): 400 mg via ORAL
  Filled 2020-08-13 (×3): qty 1

## 2020-08-13 MED ORDER — HYDRALAZINE HCL 25 MG PO TABS: 25.0000 mg | ORAL_TABLET | Freq: Four times a day (QID) | ORAL | Status: DC | PRN

## 2020-08-13 MED ORDER — DOCUSATE SODIUM 100 MG PO CAPS: 100.0000 mg | ORAL_CAPSULE | Freq: Two times a day (BID) | ORAL | Status: DC | PRN

## 2020-08-13 NOTE — Discharge Summary (Signed)
Physician Discharge Summary  Johnny Delgado GBT:[id number] DOB: [birth year] DOA: 08/10/2020  PCP: Hoyt Koch, MD (Inactive)  Admit date: 08/10/2020 Discharge date: 08/13/2020  Admitted From: home Disposition: Inpatient psychiatry  Recommendations for Outpatient Follow-up:  1. Follow up with PCP in 1-2 weeks   Home Health:no  Equipment/Devices: none  Discharge Condition: Stable Code Status:   Code Status: Full Code Diet recommendation:  Diet Order            Diet - low sodium heart healthy           Diet regular Room service appropriate? Yes; Fluid consistency: Thin  Diet effective now                 Brief/Interim Summary: 25 year old male with stable anxiety, depression, ADHD admitted on 3/8 due to concern of overdose after EMS activated by her roommate with report of possible 150-200 (500mg ) tylenol tablets taken, 10-15 fluoxetine, 10-15 hydroxyzine. He was witnessed by EMS to have emesis with pill fragments in it. The patient was drowsy in the ER and intubated for airway protection. He was given activated charcoal in the ER which resulted in immediate emesis.  Patient was admitted to ICU.  Patient was monitored closely for polysubstance overdose/major depression with ingestion of Prozac, hydroxyzine, Tylenol UDS positive for THC, EtOH negative, managed with NAC protocol, bicarb drip. Patient LFTs INR Tylenol level overall is stable and extubated 3/9 and transferred to [facility] service 3/10. Subsequently patient was taken off NG tube seen by speech placed on diet. He remains alert awake oriented x3.  He was seen by psychiatry and advised inpatient psychiatric admission. Tylenol level has been less than 10x2, LFTs remains a stable INR improved to 1.2.  No leukocytosis no fever. Patient is medically stable for discharge to inpatient psychiatric facility once bed available   Discharge Diagnoses:   Polysubstance abuse Major depression Overdose with ingested Prozac, hydroxyzine and  acetaminophen S/p NAC protocol, status post activated charcoal. Tylenol level <10x2, INR improved to 1.9>1.2, off NAC.  Poison control was involved and signed off.5/10 LFTs normal AST ALT total bili, electrolytes remains a stable.  Seen by psychiatry and will need inpatient psychiatric admission due to serious nature of his suicidal attempt and ongoing depression.  Continue one-to-one/IVC  Acute respiratory insufficiency Intubated to protect airway in the setting of overdose NGT and Foley in place Extubated 3/9 and doing well, off NG tube and off Foley catheter 3/10.  Tolerating diet.  On room air  Prolonged QTC: QTC 467 on 3/9.    Hypokalemia Hypomagnesemia Hypophosphatemia Improved  Morbid Obesity with BMI 30: will benefit outpatient PCP follow-up and weight loss  Plan of care discussed with patient's mother at the bedside and patient  Consults:  Inpatient psychiatry  PCCM  Subjective: Aaox3, his mother at bed Fees low not new complaints  Discharge Exam: Vitals:   08/13/20 0108 08/13/20 0553  BP: 119/74 135/85  Pulse: 84 (!) 104  Resp: 17 16  Temp: 99 F (37.2 C) 98.4 F (36.9 C)  SpO2: 97% 98%   General: Pt is alert, awake, not in acute distress Cardiovascular: RRR, S1/S2 +, no rubs, no gallops Respiratory: CTA bilaterally, no wheezing, no rhonchi Abdominal: Soft, NT, ND, bowel sounds + Extremities: no edema, no cyanosis  Discharge Instructions  Discharge Instructions    Diet - low sodium heart healthy   Complete by: As directed    Increase activity slowly   Complete by: As directed  Allergies as of 08/13/2020      Reactions   Banana Diarrhea      Medication List    STOP taking these medications   FLUoxetine 20 MG capsule Commonly known as: PROZAC   hydrOXYzine 25 MG tablet Commonly known as: ATARAX/VISTARIL   QUEtiapine 100 MG tablet Commonly known as: SEROQUEL     TAKE these medications   hydrALAZINE 25 MG tablet Commonly known as:  APRESOLINE Take 1 tablet (25 mg total) by mouth every 6 (six) hours as needed (sbp>160).   ibuprofen 200 MG tablet Commonly known as: ADVIL Take 200 mg by mouth every 6 (six) hours as needed for headache.       Follow-up Information    Hoyt Koch, MD Follow up in 1 week(s).   Specialty: Family Medicine Contact information: [address] [place] [address] [phone]              Allergies  Allergen Reactions  . Banana Diarrhea    The results of significant diagnostics from this hospitalization (including imaging, microbiology, ancillary and laboratory) are listed below for reference.    Microbiology: Recent Results (from the past 240 hour(s))  Resp Panel by RT-PCR (Flu A&B, Covid) Nasopharyngeal Swab     Status: None   Collection Time: 08/10/20 11:53 PM   Specimen: Nasopharyngeal Swab; Nasopharyngeal(NP) swabs in vial transport medium  Result Value Ref Range Status   SARS Coronavirus 2 by RT PCR NEGATIVE NEGATIVE Final    Comment: (NOTE) SARS-CoV-2 target nucleic acids are NOT DETECTED.  The SARS-CoV-2 RNA is generally detectable in upper respiratory specimens during the acute phase of infection. The lowest concentration of SARS-CoV-2 viral copies this assay can detect is 138 copies/mL. A negative result does not preclude SARS-Cov-2 infection and should not be used as the sole basis for treatment or other patient management decisions. A negative result may occur with  improper specimen collection/handling, submission of specimen other than nasopharyngeal swab, presence of viral mutation(s) within the areas targeted by this assay, and inadequate number of viral copies(<138 copies/mL). A negative result must be combined with clinical observations, patient history, and epidemiological information. The expected result is Negative.  Fact Sheet for Patients:  BloggerCourse.com  Fact Sheet for Healthcare Providers:   SeriousBroker.it  This test is no t yet approved or cleared by the [place] FDA and  has been authorized for detection and/or diagnosis of SARS-CoV-2 by FDA under an Emergency Use Authorization (EUA). This EUA will remain  in effect (meaning this test can be used) for the duration of the COVID-19 declaration under Section 564(b)(1) of the Act, 21 U.S.C.section 360bbb-3(b)(1), unless the authorization is terminated  or revoked sooner.       Influenza A by PCR NEGATIVE NEGATIVE Final   Influenza B by PCR NEGATIVE NEGATIVE Final    Comment: (NOTE) The Xpert Xpress SARS-CoV-2/FLU/RSV plus assay is intended as an aid in the diagnosis of influenza from Nasopharyngeal swab specimens and should not be used as a sole basis for treatment. Nasal washings and aspirates are unacceptable for Xpert Xpress SARS-CoV-2/FLU/RSV testing.  Fact Sheet for Patients: BloggerCourse.com  Fact Sheet for Healthcare Providers: SeriousBroker.it  This test is not yet approved or cleared by the [place] FDA and has been authorized for detection and/or diagnosis of SARS-CoV-2 by FDA under an Emergency Use Authorization (EUA). This EUA will remain in effect (meaning this test can be used) for the duration of the COVID-19 declaration under Section 564(b)(1)  of the Act, 21 U.S.C. section 360bbb-3(b)(1), unless the authorization is terminated or revoked.  Performed at [facility], [address] [place], [place] [address]   MRSA PCR Screening     Status: None   Collection Time: 08/11/20  6:01 AM   Specimen: Nasopharyngeal  Result Value Ref Range Status   MRSA by PCR NEGATIVE NEGATIVE Final    Comment:        The GeneXpert MRSA Assay (FDA approved for NASAL specimens only), is one component of a comprehensive MRSA colonization surveillance program. It is not intended to diagnose MRSA infection nor  to guide or monitor treatment for MRSA infections. Performed at [facility], [address] [place], [place] [address]     Procedures/Studies: Cathleen Corti 1 View  Result Date: 08/12/2020 CLINICAL DATA:  25 year old male with history of prior small bowel obstruction. Recent overdose. EXAM: ABDOMEN - 1 VIEW COMPARISON:  Portable abdomen 08/10/2020. CT Abdomen and Pelvis 07/13/2019. FINDINGS: Portable AP supine views at 0833 hours. Enteric tube no longer visible. Non obstructed bowel gas pattern. Visible abdominal and pelvic visceral contours appear normal. No osseous abnormality identified. IMPRESSION: Enteric tube no longer visible.  Normal bowel gas pattern. Electronically Signed   By: Odessa Fleming M.D.   On: 08/12/2020 09:09   DG Abdomen 1 View  Result Date: 08/11/2020 CLINICAL DATA:  NG tube placement EXAM: ABDOMEN - 1 VIEW COMPARISON:  CT 07/13/2019. FINDINGS: Transesophageal tube tip and side port terminate distal to the GE junction. Lung bases are clear. There is a relative paucity of upper abdominal bowel gas which is a nonspecific finding which can be seen both is a normal finding or in the setting of obstruction, correlate with abdominal findings. No visible subdiaphragmatic free air on this semi upright exam. No worrisome calcifications. IMPRESSION: 1. Transesophageal tube tip and side port terminate distal to the GE junction. 2. Paucity of upper abdominal bowel gas, a nonspecific finding. 3. No visible subdiaphragmatic free air on this semi upright exam. Electronically Signed   By: Kreg Shropshire M.D.   On: 08/11/2020 00:28   DG Chest Port 1 View  Result Date: 08/12/2020 CLINICAL DATA:  25 year old male status post overdose.  Extubated. EXAM: PORTABLE CHEST 1 VIEW COMPARISON:  Portable chest 08/11/2020 and earlier. FINDINGS: Enteric tube position not significantly changed. Extubated. Low but mildly improved lung volumes compared to yesterday. Mediastinal contours remain within  normal limits. No pneumothorax, pulmonary edema or pleural effusion. Streaky perihilar opacity, likely atelectasis, has decreased but not resolved. No areas of worsening ventilation. No osseous abnormality identified. Paucity of bowel gas in the upper abdomen. IMPRESSION: 1. Extubated with mildly improved lung volumes and decreased perihilar atelectasis. No new cardiopulmonary abnormality. 2. Stable enteric tube. Electronically Signed   By: Odessa Fleming M.D.   On: 08/12/2020 05:09   DG Chest Portable 1 View  Result Date: 08/11/2020 CLINICAL DATA:  Status post intubation EXAM: PORTABLE CHEST 1 VIEW COMPARISON:  Film from the previous day. FINDINGS: Endotracheal tube and gastric catheter are noted in satisfactory position. Cardiac shadow is within normal limits. Hypoinflation is again seen. The lungs are clear. No bony abnormality is noted. IMPRESSION: Tubes and lines are noted in satisfactory position. Overall poor inspiratory effort without acute abnormality. Electronically Signed   By: Alcide Clever M.D.   On: 08/11/2020 01:12   DG Chest Portable 1 View  Result Date: 08/10/2020 CLINICAL DATA:  Overdose EXAM: PORTABLE CHEST 1 VIEW COMPARISON:  Radiograph 07/13/2019  FINDINGS: Low volumes with hypoventilatory atelectatic changes and vascular crowding. No convincing features of frank edema. No pneumothorax or visible effusion though portion of the right costophrenic sulcus is collimated. Cardiomediastinal contours are prominent though possibly accentuated by portable technique and low volumes. Telemetry leads overlie the chest. No acute osseous or soft tissue abnormality. IMPRESSION: 1. Low lung volumes with hypoventilatory changes and vascular crowding. 2. Prominence of the cardiomediastinal contours, possibly accentuated by portable technique and low volumes. Electronically Signed   By: Kreg Shropshire M.D.   On: 08/10/2020 23:49    Labs: BNP (last 3 results) No results for input(s): BNP in the last 8760  hours. Basic Metabolic Panel: Recent Labs  Lab 08/11/20 0425 08/11/20 0713 08/11/20 1134 08/11/20 2048 08/12/20 0239 08/13/20 0558  NA  --  136 146* 144 138 138  K  --  3.4* 3.1* 3.8 3.8 3.8  CL  --  105 108 111 109 106  CO2  --  21* 21* 25 22 22   GLUCOSE  --  153* 245* 115* 110* 86  BUN  --  7 6 6 6 8   CREATININE  --  0.62 0.65 0.76 0.68 0.67  CALCIUM  --  7.9* 8.1* 8.6* 8.5* 8.9  MG 1.9 2.4 2.1  --  2.0 2.0  PHOS 2.4*  --  1.8*  --   --   --    Liver Function Tests: Recent Labs  Lab 08/11/20 0713 08/11/20 1134 08/11/20 2048 08/12/20 0239 08/13/20 0558  AST 21 18 20 20 17   ALT 32 29 29 27 23   ALKPHOS 26* 23* 27* 29* 35*  BILITOT 1.0 0.9 1.0 1.1 1.0  PROT 6.6 6.4* 6.6 6.4* 7.0  ALBUMIN 3.7 3.5 3.7 3.6 3.8   No results for input(s): LIPASE, AMYLASE in the last 168 hours. No results for input(s): AMMONIA in the last 168 hours. CBC: Recent Labs  Lab 08/10/20 2324 08/11/20 0425 08/11/20 1134 08/11/20 2048 08/13/20 0558  WBC 7.3 10.3 9.1 12.0* 9.2  NEUTROABS 3.0  --   --   --   --   HGB 14.2 13.1 12.9* 13.5 13.4  HCT 42.6 38.2* 37.8* 40.7 41.0  MCV 86.9 86.6 85.3 88.9 88.6  PLT 125* 125* 124* 126* 112*   Cardiac Enzymes: No results for input(s): CKTOTAL, CKMB, CKMBINDEX, TROPONINI in the last 168 hours. BNP: Invalid input(s): POCBNP CBG: Recent Labs  Lab 08/10/20 2332  GLUCAP 134*   D-Dimer No results for input(s): DDIMER in the last 72 hours. Hgb A1c No results for input(s): HGBA1C in the last 72 hours. Lipid Profile No results for input(s): CHOL, HDL, LDLCALC, TRIG, CHOLHDL, LDLDIRECT in the last 72 hours. Thyroid function studies No results for input(s): TSH, T4TOTAL, T3FREE, THYROIDAB in the last 72 hours.  Invalid input(s): FREET3 Anemia work up No results for input(s): VITAMINB12, FOLATE, FERRITIN, TIBC, IRON, RETICCTPCT in the last 72 hours. Urinalysis    Component Value Date/Time   COLORURINE STRAW (A) 08/11/2020 0248   APPEARANCEUR  CLEAR 08/11/2020 0248   LABSPEC 1.028 08/11/2020 0248   PHURINE 7.0 08/11/2020 0248   GLUCOSEU >=500 (A) 08/11/2020 0248   HGBUR NEGATIVE 08/11/2020 0248   BILIRUBINUR NEGATIVE 08/11/2020 0248   KETONESUR 80 (A) 08/11/2020 0248   PROTEINUR NEGATIVE 08/11/2020 0248   NITRITE NEGATIVE 08/11/2020 0248   LEUKOCYTESUR NEGATIVE 08/11/2020 0248   Sepsis Labs Invalid input(s): PROCALCITONIN,  WBC,  LACTICIDVEN Microbiology Recent Results (from the past 240 hour(s))  Resp Panel by RT-PCR (Flu  A&B, Covid) Nasopharyngeal Swab     Status: None   Collection Time: 08/10/20 11:53 PM   Specimen: Nasopharyngeal Swab; Nasopharyngeal(NP) swabs in vial transport medium  Result Value Ref Range Status   SARS Coronavirus 2 by RT PCR NEGATIVE NEGATIVE Final    Comment: (NOTE) SARS-CoV-2 target nucleic acids are NOT DETECTED.  The SARS-CoV-2 RNA is generally detectable in upper respiratory specimens during the acute phase of infection. The lowest concentration of SARS-CoV-2 viral copies this assay can detect is 138 copies/mL. A negative result does not preclude SARS-Cov-2 infection and should not be used as the sole basis for treatment or other patient management decisions. A negative result may occur with  improper specimen collection/handling, submission of specimen other than nasopharyngeal swab, presence of viral mutation(s) within the areas targeted by this assay, and inadequate number of viral copies(<138 copies/mL). A negative result must be combined with clinical observations, patient history, and epidemiological information. The expected result is Negative.  Fact Sheet for Patients:  BloggerCourse.comhttps://www.fda.gov/media/152166/download  Fact Sheet for Healthcare Providers:  SeriousBroker.ithttps://www.fda.gov/media/152162/download  This test is no t yet approved or cleared by the [place] FDA and  has been authorized for detection and/or diagnosis of SARS-CoV-2 by FDA under an Emergency Use Authorization  (EUA). This EUA will remain  in effect (meaning this test can be used) for the duration of the COVID-19 declaration under Section 564(b)(1) of the Act, 21 U.S.C.section 360bbb-3(b)(1), unless the authorization is terminated  or revoked sooner.       Influenza A by PCR NEGATIVE NEGATIVE Final   Influenza B by PCR NEGATIVE NEGATIVE Final    Comment: (NOTE) The Xpert Xpress SARS-CoV-2/FLU/RSV plus assay is intended as an aid in the diagnosis of influenza from Nasopharyngeal swab specimens and should not be used as a sole basis for treatment. Nasal washings and aspirates are unacceptable for Xpert Xpress SARS-CoV-2/FLU/RSV testing.  Fact Sheet for Patients: BloggerCourse.comhttps://www.fda.gov/media/152166/download  Fact Sheet for Healthcare Providers: SeriousBroker.ithttps://www.fda.gov/media/152162/download  This test is not yet approved or cleared by the [place] FDA and has been authorized for detection and/or diagnosis of SARS-CoV-2 by FDA under an Emergency Use Authorization (EUA). This EUA will remain in effect (meaning this test can be used) for the duration of the COVID-19 declaration under Section 564(b)(1) of the Act, 21 U.S.C. section 360bbb-3(b)(1), unless the authorization is terminated or revoked.  Performed at [facility], [address] [place], [place] [address]   MRSA PCR Screening     Status: None   Collection Time: 08/11/20  6:01 AM   Specimen: Nasopharyngeal  Result Value Ref Range Status   MRSA by PCR NEGATIVE NEGATIVE Final    Comment:        The GeneXpert MRSA Assay (FDA approved for NASAL specimens only), is one component of a comprehensive MRSA colonization surveillance program. It is not intended to diagnose MRSA infection nor to guide or monitor treatment for MRSA infections. Performed at [facility], [address] [place], [place] [address]      Time coordinating discharge: 25 minutes  SIGNED: Lanae Boastamesh Theodore Virgin, MD  [facility] 08/13/2020, 9:47 AM  If 7PM-7AM, please contact night-coverage [email or web address]

## 2020-08-13 NOTE — Tx Team (Signed)
Initial Treatment Plan 08/13/2020 4:38 PM Rickey Barbara QQI:[id number]    PATIENT STRESSORS: Financial difficulties Health problems Medication change or noncompliance Occupational concerns   PATIENT STRENGTHS: Ability for insight Average or above average intelligence Communication skills Motivation for treatment/growth   PATIENT IDENTIFIED PROBLEMS: "Feel comfortable about my living environment"  "To be more open to people around me"  Suicidal ideation  Depression  Medication noncompliance             DISCHARGE CRITERIA:  Ability to meet basic life and health needs Adequate post-discharge living arrangements Motivation to continue treatment in a less acute level of care  PRELIMINARY DISCHARGE PLAN: Attend aftercare/continuing care group Outpatient therapy Return to previous living arrangement  PATIENT/FAMILY INVOLVEMENT: This treatment plan has been presented to and reviewed with the patient, STONEWALL DOSS.  The patient and family have been given the opportunity to ask questions and make suggestions.  Clarene Critchley, RN 08/13/2020, 4:38 PM

## 2020-08-13 NOTE — Progress Notes (Signed)
   08/13/20 2210  Psych Admission Type (Psych Patients Only)  Admission Status Involuntary  Psychosocial Assessment  Patient Complaints Anxiety;Depression  Eye Contact Fair  Facial Expression Anxious  Affect Appropriate to circumstance  Speech Logical/coherent  Interaction Assertive  Motor Activity Other (Comment) (wnl)  Appearance/Hygiene In scrubs  Behavior Characteristics Cooperative;Anxious  Mood Depressed;Anxious  Thought Process  Coherency WDL  Content WDL  Delusions None reported or observed  Perception WDL  Hallucination None reported or observed  Judgment Impaired  Confusion None  Danger to Self  Current suicidal ideation? Denies  Agreement Not to Harm Self Yes  Description of Agreement verbal agreement to approach staff  Danger to Others  Danger to Others None reported or observed   Pt seen in milieu in dayroom interacting with peers. Pt denies SI, HI, AVH and pain. Pt rates anxiety 4/10 and depression 4/10. Pt talked about the fact that he was intubated after his suicide attempt. "I won't do that again. I have learned my lesson with this experience." Pt has flat anxious affect.

## 2020-08-13 NOTE — Progress Notes (Signed)
Pt accepted to [facility] room 301-1  Patient meets inpatient criteria per Caryn Bee, NP  Dr. Jola Babinski is the attending provider.    Call report to [phone]    Sandford Craze, RN Case Manager @ [facility] notified.     Pt is IVC .    Pt may be transported by [facility]   Pt scheduled  to arrive at [facility] at 1500 PM.  Signed:  Corky Crafts, MSW, LCSWA, LCASA 08/13/2020 1:30 PM

## 2020-08-13 NOTE — Progress Notes (Signed)
Admission Note: Patient is a 25 year old male admitted to the unit from [facility] under IVC with intentional overdose on his prescribed medications in a suicide attempt.  Reports worsening depression over the past few months.  Reports financial and job problems as stressors.  States he stop taking his prescribed medications because he cannot afford them.  Patient currently denies suicidal thoughts and verbally contracts for safety while in the hospital.  Patient presents with a blunted affect and depressed mood.  Admission plan of care reviewed with consent for treatment signed.  Personal and skin assessment completed.  No contraband found.  Skin is dry and intact.  Patient oriented to the unit, staff and room.  Routine safety checks initiated.  Verbalizes understanding of unit rules and protocols.  Patient is safe on the unit.

## 2020-08-13 NOTE — TOC Initial Note (Signed)
Transition of Care [facility]) - Initial/Assessment Note    Patient Details  Name: Johnny Delgado MRN: [id number] Date of Birth: 1995-06-14  Transition of Care [facility]) CM/SW Contact:    Bartholome Bill, RN Phone Number: 08/13/2020, 1:19 PM  Clinical Narrative:                 Pt was recommended for Inpatient Psych placement. He is now medically ready and his information was sent to [facility] to review.  Addendum. [facility] has bed available this afternoon after 3pm. RN made aware. Non-emergency EMS contacted for [facility] to transport pt to [facility]. IVC paperwork will go with pt.  Expected Discharge Plan: Home/Self Care Barriers to Discharge: Continued Medical Work up   Patient Goals and CMS Choice Patient states their goals for this hospitalization and ongoing recovery are:: unable to state on vent      Expected Discharge Plan and Services Expected Discharge Plan: Home/Self Care   Discharge Planning Services: CM Consult   Living arrangements for the past 2 months: Apartment Expected Discharge Date: 08/13/20                                    Prior Living Arrangements/Services Living arrangements for the past 2 months: Apartment Lives with:: Self Patient language and need for interpreter reviewed:: Yes        Need for Family Participation in Patient Care: Yes (Comment) Care giver support system in place?: Yes (comment)   Criminal Activity/Legal Involvement Pertinent to Current Situation/Hospitalization: No - Comment as needed  Activities of Daily Living Home Assistive Devices/Equipment: Eyeglasses ADL Screening (condition at time of admission) Patient's cognitive ability adequate to safely complete daily activities?: Yes Is the patient deaf or have difficulty hearing?: No Does the patient have difficulty seeing, even when wearing glasses/contacts?: No Does the patient have difficulty concentrating, remembering, or making decisions?: No Patient able to express need for assistance with  ADLs?: Yes Does the patient have difficulty dressing or bathing?: No Independently performs ADLs?: Yes (appropriate for developmental age) Does the patient have difficulty walking or climbing stairs?: No Weakness of Legs: None Weakness of Arms/Hands: None  Permission Sought/Granted                  Emotional Assessment Appearance:: Appears stated age Attitude/Demeanor/Rapport: Unable to Assess Affect (typically observed): Unable to Assess Orientation: : Fluctuating Orientation (Suspected and/or reported Sundowners) Alcohol / Substance Use: Illicit Drugs Psych Involvement: No (comment)  Admission diagnosis:  Hypokalemia [E87.6] Marijuana abuse [F12.10] Tachycardia [R00.0] Acetaminophen overdose [T39.1X1A] QT prolongation [R94.31] Intentional acetaminophen overdose, initial encounter (HCC) [T39.1X2A] Suicide attempt by acetaminophen overdose, initial encounter [facility]) [T39.1X2A] Patient Active Problem List   Diagnosis Date Noted  . Acetaminophen overdose 08/11/2020  . Endotracheally intubated 08/11/2020  . Hypokalemia 08/11/2020  . Acute respiratory failure (HCC)   . Dehydration 07/13/2019  . Gastroenteritis 07/13/2019  . ADHD (attention deficit hyperactivity disorder), inattentive type 07/30/2017  . Severe recurrent major depression without psychotic features (HCC) 07/20/2017   PCP:  Hoyt Koch, MD (Inactive) Pharmacy:   [facility] [address], [place] - [address] [place] [address] Phone: [phone] Fax: [phone]     Social Determinants of Health (SDOH) Interventions    Readmission Risk Interventions No flowsheet data found.

## 2020-08-13 NOTE — Progress Notes (Signed)
Pt endorses discomfort in right hand from IVs during stay in hospital. Pt given Advil 400 mg per MAR.

## 2020-08-14 DIAGNOSIS — T1491XA Suicide attempt, initial encounter: Secondary | ICD-10-CM | POA: Diagnosis not present

## 2020-08-14 MED ORDER — FLUOXETINE HCL 10 MG PO CAPS
10.0000 mg | ORAL_CAPSULE | Freq: Every day | ORAL | Status: DC
  Administered 2020-08-14 – 2020-08-16 (×3): 10 mg via ORAL
  Filled 2020-08-14 (×6): qty 1

## 2020-08-14 NOTE — Progress Notes (Signed)
   08/14/20 2258  COVID-19 Daily Checkoff  Have you had a fever (temp > 37.80C/100F)  in the past 24 hours?  No  If you have had runny nose, nasal congestion, sneezing in the past 24 hours, has it worsened? No  COVID-19 EXPOSURE  Have you traveled outside the state in the past 14 days? No  Have you been in contact with someone with a confirmed diagnosis of COVID-19 or PUI in the past 14 days without wearing appropriate PPE? No  Have you been living in the same home as a person with confirmed diagnosis of COVID-19 or a PUI (household contact)? No  Have you been diagnosed with COVID-19? No

## 2020-08-14 NOTE — [facility] Group Notes (Addendum)
LCSW Group Therapy Note  08/14/2020    10:00am-11:00am  Type of Therapy and Topic:  Group Therapy: Anger - Unhealthy versus Healthy Coping Skills  Participation Level:  None   Description of Group:   In this group, patients acknowledged that there is a continuum of anger from "mildly irritated" to "murderous rage."  They identified how they usually or often react when angered.  They learned how healthy and unhealthy coping skills all work initially, but the unhealthy ones stop working while the healthy ones remain helpful.   They analyzed how their frequently-chosen coping skill is possibly beneficial and how it is possibly unhelpful.  The group discussed a variety of healthier coping skills that could help in resolving the actual issues, as well as how to go about planning for the the possibility of future similar situations.  Patients then individually identified one healthy coping skill that they would be willing to try.  Therapeutic Goals: . Patients will identify one thing that makes them angry and how they feel emotionally and physically, what their thoughts are or tend to be in those situations, and what healthy or unhealthy coping mechanism they typically use . Patients will identify how their coping technique works for them, as well as how it works against them. . Patients will explore possible new behaviors to use in future anger situations and decide on one technique they will try in the future . Patients will learn that anger itself is normal and cannot be eliminated, and that healthier coping skills can assist with resolving conflict rather than worsening situations.  Summary of Patient Progress:  The patient shared that he often chooses to cope with angry feelings by putting on music and shutting everything else out.  Sometimes it helps and sometimes he remains angry, depending on the type of music he chooses to play.  The patient sees this coping skill as unhealthy when he chooses  helpful music and unhealthy when he chooses music that keeps him stuck.  One healthy coping skill the patient expressed willingness to try in the future is talking to his friends about his feelings.  He spoke very little, had almost no eye contact throughout group.  Therapeutic Modalities:   Cognitive Behavioral Therapy Motivation Interviewing  Lynnell Chad, LCSW

## 2020-08-14 NOTE — [facility] Group Notes (Signed)
.  Psychoeducational Group Note    Date:08/14/2020 Time: 1300-1400    Life Skills:  A group where two lists are made. What people need and what are things that we do that are healthy. The lists are developed by the patients and it is explained that we often do the actions that are not healthy to get our list of needs met.   Purpose of Group: . The group focus' on teaching patients on how to identify their needs and how to develop the coping skills needed to get their needs met  Participation Level:  Active  Participation Quality:  Appropriate  Affect:  Appropriate  Cognitive:  Oriented  Insight:  Improving  Engagement in Group:  Engaged  Additional Comments:  Pt attended the group and participated fully. Rates his energy at a 6/10  Bryson Dames A

## 2020-08-14 NOTE — Progress Notes (Signed)
   08/14/20 2258  COVID-19 Daily Checkoff  Have you had a fever (temp > 37.80C/100F)  in the past 24 hours?  No  If you have had runny nose, nasal congestion, sneezing in the past 24 hours, has it worsened? No  COVID-19 EXPOSURE  Have you traveled outside the state in the past 14 days? No  Have you been in contact with someone with a confirmed diagnosis of COVID-19 or PUI in the past 14 days without wearing appropriate PPE? No  Have you been living in the same home as a person with confirmed diagnosis of COVID-19 or a PUI (household contact)? No  Have you been diagnosed with COVID-19? No   

## 2020-08-14 NOTE — [facility] Suicide Risk Assessment (Signed)
[facility] Admission Suicide Risk Assessment   Nursing information obtained from:  Patient Demographic factors:  Male Current Mental Status:  Self-harm thoughts Loss Factors:  Financial problems / change in socioeconomic status Historical Factors:  Impulsivity Risk Reduction Factors:  Living with another person, especially a relative  Total Time spent with patient: 30 minutes Principal Problem: Suicide attempt [facility]) Diagnosis:  Principal Problem:   Suicide attempt (HCC)  Subjective Data: Patient is a 25 year old male with a past psychiatric history significant for depression, anxiety, suggestive of borderline personality disorder, ADHD who was originally admitted to the [facility] medical service on 08/10/2020 after an intentional overdose of Tylenol, fluoxetine, hydroxyzine.  On site EMS witnessed emesis that contained pill fragments.  The patient was sedated on admission, and was intubated for airway protection.  He was admitted to the intensive care unit.  He was treated for the overdose, and extubated on 3/9.  Psychiatric consultation was obtained on 3/10.  Recommendations for transfer to the psychiatric hospital were obtained.  Patient has a longstanding history of psychiatric issues.  Patient was most recently treated by his primary care provider.  He had been treated both psychiatrically and with therapy through the [facility] behavioral health system back in 2019.  His last psychiatric hospitalization at our facility was in February 2019.  At that time he had dropped out of college, and had attempted to walk into traffic in a suicide attempt.  He admitted at that time that he had been depressed since he was 25 years of age.  He had been treated with fluoxetine as well at that time.  His primary care provider had apparently increased his fluoxetine to 60 mg a day on 05/27/2020.  The patient stated that the expense of the medications led to him not taking them, and thus leading to suicidal  ideation.  Today he continues to complain of depressive symptoms.  Continued Clinical Symptoms:  Alcohol Use Disorder Identification Test Final Score (AUDIT): 1 The "Alcohol Use Disorders Identification Test", Guidelines for Use in Primary Care, Second Edition.  World Science writer [facility]). Score between 0-7:  no or low risk or alcohol related problems. Score between 8-15:  moderate risk of alcohol related problems. Score between 16-19:  high risk of alcohol related problems. Score 20 or above:  warrants further diagnostic evaluation for alcohol dependence and treatment.   CLINICAL FACTORS:   Depression:   Anhedonia Hopelessness Impulsivity Insomnia Personality Disorders:   Cluster B More than one psychiatric diagnosis Unstable or Poor Therapeutic Relationship Previous Psychiatric Diagnoses and Treatments   Musculoskeletal: Strength & Muscle Tone: within normal limits Gait & Station: normal Patient leans: N/A  Psychiatric Specialty Exam:  Presentation  General Appearance: Appropriate for Environment  Eye Contact:Minimal  Speech:Clear and Coherent  Speech Volume:Decreased  Handedness:Left   Mood and Affect  Mood:Depressed  Affect:Congruent   Thought Process  Thought Processes:Coherent  Descriptions of Associations:Intact  Orientation:Full (Time, Place and Person)  Thought Content:Logical  History of Schizophrenia/Schizoaffective disorder:No data recorded Duration of Psychotic Symptoms:No data recorded Hallucinations:Hallucinations: None  Ideas of Reference:None  Suicidal Thoughts:Suicidal Thoughts: Yes, Active SI Active Intent and/or Plan: Without Intent  Homicidal Thoughts:Homicidal Thoughts: No   Sensorium  Memory:Immediate Fair; Recent Fair  Judgment:Intact  Insight:Lacking   Executive Functions  Concentration:Fair  Attention Span:Fair  Recall:Fair  Fund of Knowledge:Good  Language:Good   Psychomotor Activity  Psychomotor  Activity:Psychomotor Activity: Normal   Assets  Assets:Communication Skills   Sleep  Sleep:Sleep: Fair  Physical Exam: Physical Exam Vitals and nursing note reviewed.  HENT:     Head: Normocephalic and atraumatic.  Pulmonary:     Effort: Pulmonary effort is normal.  Neurological:     General: No focal deficit present.     Mental Status: He is alert and oriented to person, place, and time.    ROS Blood pressure (!) 140/91, pulse 89, temperature (!) 97.5 F (36.4 C), temperature source Oral, resp. rate 18, height 5\' 10"  (1.778 m), weight 93.9 kg, SpO2 100 %. Body mass index is 29.7 kg/m.   COGNITIVE FEATURES THAT CONTRIBUTE TO RISK:  Thought constriction (tunnel vision)    SUICIDE RISK:   Severe:  Frequent, intense, and enduring suicidal ideation, specific plan, no subjective intent, but some objective markers of intent (i.e., choice of lethal method), the method is accessible, some limited preparatory behavior, evidence of impaired self-control, severe dysphoria/symptomatology, multiple risk factors present, and few if any protective factors, particularly a lack of social support.  PLAN OF CARE: Patient is seen and examined.  Patient is a 25 year old male with the above-stated past psychiatric history who was admitted on 08/13/2020 after being transferred from [facility] service after an intentional overdose in a suicide attempt.  The patient is requesting to restart his fluoxetine.  Agree with starting at 10 mg p.o. daily and titrate.  Agree with trazodone in the short run, and hydroxyzine.  We will continue the Colace, famotidine, MiraLAX, and ibuprofen as per the medical service.  Review of his admission laboratories revealed normal electrolytes as of 3/11.  This included renal function and liver function enzymes.  On 3/10 his lactic acid was 0.8, pro calcitonin was less than 0.10.  CBC on 3/11 was essentially normal except with thrombocytopenia.  The patient has a  longstanding problem with thrombocytopenia.  His platelet count 1 year ago was 83,000, and had gotten up to 126,003 days ago.  On the 11th it was 112.  We will have to review the electronic medical record to see what the diagnosis is for his thrombocytopenia.  Differential was normal.  PT was 14.9, INR was 1.2.  His acetaminophen level 4 days ago was 181.  2 days ago after treatment was less than 10.  Salicylate was less than 7.  Respiratory panel was negative for influenza A, influenza B and coronavirus.  His urine did have 500 mg per DL of glucose, and also ketones.  I am unable to find a diagnosis of diabetes for him in the past.  Blood sugar this morning was 86.  We will check daily blood sugars just in case.  There was no bacteria noted in his urine, white blood cells were 0-5.  Blood alcohol on admission was less than 10, drug screen was positive for marijuana.  MRSA by PCR was negative.  Patient had a history of a small bowel obstruction in the past, and with the overdose an abdominal film was obtained which showed the enteric tube no longer visible.  Normal bowel gas pattern.  Chest x-ray from 3/10 showed that the patient was extubated with mildly improved lung volumes and decreased perihilar atelectasis.  There was no new cardiopulmonary abnormality.  EKG showed a normal sinus rhythm with a QTc interval of 467.  I certify that inpatient services furnished can reasonably be expected to improve the patient's condition.   5/10, MD 08/14/2020, 9:29 AM

## 2020-08-14 NOTE — [facility] Group Notes (Signed)
.  Psychoeducational Group Note  Date: 08-14-2020 Time: 0900-1000    Goal Setting   Purpose of Group: This group helps to provide patients with the steps of setting Delgado goal that is specific, measurable, attainable, realistic and time specific. Delgado discussion on how we keep ourselves stuck with negative self talk.    Participation Level:  Active  Participation Quality:  Appropriate  Affect:  Appropriate  Cognitive:  Appropriate  Insight:  Improving  Engagement in Group:  Engaged  Additional Comments:  Rates his energy at Delgado 5. Interactive within the group  Johnny Delgado

## 2020-08-14 NOTE — Progress Notes (Signed)
[facility] Group Notes:  (Nursing/MHT/Case Management/Adjunct)  Date:  08/14/2020  Time: 2015  Type of Therapy:  wrap up group  Participation Level:  Active  Participation Quality:  Appropriate, Attentive, Sharing and Supportive  Affect:  Appropriate  Cognitive:  Appropriate  Insight:  Improving  Engagement in Group:  Engaged  Modes of Intervention:  Clarification, Education and Support  Summary of Progress/Problems: Positive thinking and self-care were discussed.   Marcille Buffy 08/14/2020, 8:30 PM

## 2020-08-14 NOTE — Progress Notes (Signed)
   08/14/20 2327  Psych Admission Type (Psych Patients Only)  Admission Status Involuntary  Psychosocial Assessment  Patient Complaints Anxiety  Eye Contact Fair  Facial Expression Anxious  Affect Appropriate to circumstance  Speech Logical/coherent  Interaction Assertive  Motor Activity Other (Comment) (WDL)  Appearance/Hygiene Unremarkable  Behavior Characteristics Appropriate to situation  Mood Anxious  Thought Process  Coherency WDL  Content WDL  Delusions None reported or observed  Perception WDL  Hallucination None reported or observed  Judgment Impaired  Confusion None  Danger to Self  Current suicidal ideation? Denies  Agreement Not to Harm Self Yes  Description of Agreement verbally contracts for safety  Danger to Others  Danger to Others None reported or observed

## 2020-08-14 NOTE — Progress Notes (Signed)
   08/14/20 2302  Psych Admission Type (Psych Patients Only)  Admission Status Involuntary  Psychosocial Assessment  Patient Complaints Anxiety  Eye Contact Fair  Facial Expression Anxious  Affect Appropriate to circumstance  Speech Logical/coherent  Interaction Assertive  Motor Activity Other (Comment) (WDL)  Appearance/Hygiene Unremarkable  Behavior Characteristics Appropriate to situation  Mood Anxious  Thought Process  Coherency WDL  Content WDL  Delusions None reported or observed  Perception WDL  Hallucination None reported or observed  Judgment Impaired  Confusion None  Danger to Self  Current suicidal ideation? Denies  Agreement Not to Harm Self Yes  Description of Agreement verbally contracts for safety

## 2020-08-14 NOTE — Progress Notes (Signed)
Patient rates depression 7/10, anxiety 7/10, and hopelessness 4/10 (10 being worst). He denies SI/HI. He reports fair sleep at night. He reports having a fair appetite today. He reports having a low energy level today. His stated goal "speaking with the doctor." He voiced that he would like to start medication for depression.   Orders reviewed. Vital signs reviewed. Verbal support provided. 15 minute checks performed for safety.   Patient compliant with treatment plan.

## 2020-08-14 NOTE — H&P (Addendum)
Psychiatric Admission Assessment Adult  Patient Identification: Johnny Delgado MRN:  [id number] Date of Evaluation:  08/14/2020 Chief Complaint:  Suicide attempt [facility]) [T14.91XA] Principal Diagnosis: Suicide attempt [facility]) Diagnosis:  Principal Problem:   Suicide attempt (HCC)  History of Present Illness: Johnny Delgado 25 year old Caucasian male presents after intentional overdose.  Johnny Delgado stated " I have learned my lesson the hard way."  Reports feeling stressed and overwhelmed for the past 3 to 5 months.  States he was followed by mental health in the past however, due to financial restraints has been unable to keep follow-up appointments. Stated he was prescribed Prozac, trazodone and hydroxyzine however denied that he has been taking medications consistently.  Reported previous inpatient admissions. "  Overnight hospitalization"  chart reviewed patient has a history of major depressive disorder, attention deficit disorder and anxiety.  Reports using marijuana occasionally.  Charted history with self injures behavior ie cutting.  denied any illicit drug use.  Reports a family history of mental illness.:  Mother: Depression and anxiety, Father: Depression.  Discussed restarting Prozac and trazodone.    Education was provided with follow-up at [facility] for therapy and psychiatry.Patient was receptive to plan.Support, encouragement and  reassurance was provided.   Associated Signs/Symptoms: Depression Symptoms:  depressed mood, feelings of worthlessness/guilt, difficulty concentrating, suicidal thoughts with specific plan, suicidal attempt, anxiety, Duration of Depression Symptoms: No data recorded (Hypo) Manic Symptoms:  Impulsivity, Labiality of Mood, Anxiety Symptoms:  Excessive Worry, Psychotic Symptoms:  Hallucinations: None PTSD Symptoms: NA Total Time spent with patient: 15 minutes  Past Psychiatric History:   Is the patient at risk to  self? Yes.    Has the patient been a risk to self in the past 6 months? No.  Has the patient been a risk to self within the distant past? No.  Is the patient a risk to others? No.  Has the patient been a risk to others in the past 6 months? No.  Has the patient been a risk to others within the distant past? No.   Prior Inpatient Therapy:   Prior Outpatient Therapy:    Alcohol Screening: 1. How often do you have a drink containing alcohol?: Monthly or less 2. How many drinks containing alcohol do you have on a typical day when you are drinking?: 1 or 2 3. How often do you have six or more drinks on one occasion?: Never AUDIT-C Score: 1 4. How often during the last year have you found that you were not able to stop drinking once you had started?: Never 5. How often during the last year have you failed to do what was normally expected from you because of drinking?: Never 6. How often during the last year have you needed a first drink in the morning to get yourself going after a heavy drinking session?: Never 7. How often during the last year have you had a feeling of guilt of remorse after drinking?: Never 8. How often during the last year have you been unable to remember what happened the night before because you had been drinking?: Never 9. Have you or someone else been injured as a result of your drinking?: No 10. Has a relative or friend or a doctor or another health worker been concerned about your drinking or suggested you cut down?: No Alcohol Use Disorder Identification Test Final Score (AUDIT): 1 Substance Abuse History in the last 12 months:  Yes.   Consequences of Substance Abuse: NA Previous Psychotropic  Medications: No  Psychological Evaluations: No  Past Medical History:  Past Medical History:  Diagnosis Date  . ADHD (attention deficit hyperactivity disorder)   . Anxiety   . Depression     Past Surgical History:  Procedure Laterality Date  . WISDOM TOOTH EXTRACTION  Bilateral 2015   All 4 extracted   Family History:  Family History  Problem Relation Age of Onset  . Depression Mother   . Drug abuse Mother   . Depression Father   . Drug abuse Father   . ADD / ADHD Brother   . Drug abuse Paternal Grandmother    Family Psychiatric  History: see assessment  Tobacco Screening:   Social History:  Social History   Substance and Sexual Activity  Alcohol Use No     Social History   Substance and Sexual Activity  Drug Use No    Additional Social History:                           Allergies:   Allergies  Allergen Reactions  . Banana Diarrhea   Lab Results:  Results for orders placed or performed during the hospital encounter of 08/10/20 (from the past 48 hour(s))  Comprehensive metabolic panel     Status: Abnormal   Collection Time: 08/13/20  5:58 AM  Result Value Ref Range   Sodium 138 135 - 145 mmol/L   Potassium 3.8 3.5 - 5.1 mmol/L   Chloride 106 98 - 111 mmol/L   CO2 22 22 - 32 mmol/L   Glucose, Bld 86 70 - 99 mg/dL    Comment: Glucose reference range applies only to samples taken after fasting for at least 8 hours.   BUN 8 6 - 20 mg/dL   Creatinine, Ser 4.090.67 0.61 - 1.24 mg/dL   Calcium 8.9 8.9 - 81.110.3 mg/dL   Total Protein 7.0 6.5 - 8.1 g/dL   Albumin 3.8 3.5 - 5.0 g/dL   AST 17 15 - 41 U/L   ALT 23 0 - 44 U/L   Alkaline Phosphatase 35 (L) 38 - 126 U/L   Total Bilirubin 1.0 0.3 - 1.2 mg/dL   GFR, Estimated >91>60 >47>60 mL/min    Comment: (NOTE) Calculated using the CKD-EPI Creatinine Equation (2021)    Anion gap 10 5 - 15    Comment: Performed at [facility], [address] [place], [place] [address]  CBC     Status: Abnormal   Collection Time: 08/13/20  5:58 AM  Result Value Ref Range   WBC 9.2 4.0 - 10.5 K/uL   RBC 4.63 4.22 - 5.81 MIL/uL   Hemoglobin 13.4 13.0 - 17.0 g/dL   HCT 21.341.0 08.639.0 - 57.852.0 %   MCV 88.6 80.0 - 100.0 fL   MCH 28.9 26.0 - 34.0 pg   MCHC 32.7 30.0 - 36.0 g/dL   RDW  46.912.0 62.911.5 - 52.815.5 %   Platelets 112 (L) 150 - 400 K/uL    Comment: SPECIMEN CHECKED FOR CLOTS Immature Platelet Fraction may be clinically indicated, consider ordering this additional test [id number] REPEATED TO VERIFY PLATELET COUNT CONFIRMED BY SMEAR    nRBC 0.0 0.0 - 0.2 %    Comment: Performed at [facility], [address] [place], [place] [address]  Magnesium     Status: None   Collection Time: 08/13/20  5:58 AM  Result Value Ref Range   Magnesium 2.0 1.7 - 2.4 mg/dL  Comment: Performed at [facility], [address] [place], [place] [address]    Blood Alcohol level:  Lab Results  Component Value Date   [facility] <10 08/10/2020   ETH <10 09/28/2017    Metabolic Disorder Labs:  Lab Results  Component Value Date   HGBA1C 5.7 (H) 07/21/2017   MPG 116.89 07/21/2017   No results found for: PROLACTIN Lab Results  Component Value Date   CHOL 172 07/21/2017   TRIG 132 07/21/2017   HDL 32 (L) 07/21/2017   CHOLHDL 5.4 07/21/2017   VLDL 26 07/21/2017   LDLCALC 114 (H) 07/21/2017    Current Medications: Current Facility-Administered Medications  Medication Dose Route Frequency Provider Last Rate Last Admin  . alum & mag hydroxide-simeth (MAALOX/MYLANTA) 200-200-20 MG/5ML suspension 30 mL  30 mL Oral Q4H PRN Antonieta Pert, MD      . docusate sodium (COLACE) capsule 100 mg  100 mg Oral BID PRN Antonieta Pert, MD      . famotidine (PEPCID) tablet 20 mg  20 mg Oral BID Antonieta Pert, MD   20 mg at 08/14/20 0750  . hydrOXYzine (ATARAX/VISTARIL) tablet 25 mg  25 mg Oral TID PRN Antonieta Pert, MD      . ibuprofen (ADVIL) tablet 400 mg  400 mg Oral Q6H PRN Antonieta Pert, MD   400 mg at 08/13/20 2122  . magnesium hydroxide (MILK OF MAGNESIA) suspension 30 mL  30 mL Oral Daily PRN Antonieta Pert, MD      . ondansetron [facility]) tablet 8 mg  8 mg Oral Q8H PRN Antonieta Pert, MD      . polyethylene glycol (MIRALAX /  GLYCOLAX) packet 17 g  17 g Oral Daily PRN Antonieta Pert, MD      . traZODone (DESYREL) tablet 50 mg  50 mg Oral QHS PRN Antonieta Pert, MD   50 mg at 08/13/20 2202   PTA Medications: Medications Prior to Admission  Medication Sig Dispense Refill Last Dose  . hydrALAZINE (APRESOLINE) 25 MG tablet Take 1 tablet (25 mg total) by mouth every 6 (six) hours as needed (sbp>160).     Marland Kitchen ibuprofen (ADVIL) 200 MG tablet Take 200 mg by mouth every 6 (six) hours as needed for headache.       Musculoskeletal: Strength & Muscle Tone: within normal limits Gait & Station: normal Patient leans: N/A            Psychiatric Specialty Exam:  Presentation  General Appearance: Appropriate for Environment  Eye Contact:Minimal  Speech:Clear and Coherent  Speech Volume:Decreased  Handedness:Left   Mood and Affect  Mood:Depressed  Affect:Congruent   Thought Process  Thought Processes:Coherent  Duration of Psychotic Symptoms: No data recorded Past Diagnosis of Schizophrenia or Psychoactive disorder: No data recorded Descriptions of Associations:Intact  Orientation:Full (Time, Place and Person)  Thought Content:Logical  Hallucinations:Hallucinations: None  Ideas of Reference:None  Suicidal Thoughts:Suicidal Thoughts: Yes, Active SI Active Intent and/or Plan: Without Intent  Homicidal Thoughts:Homicidal Thoughts: No   Sensorium  Memory:Immediate Fair; Recent Fair  Judgment:Intact  Insight:Lacking   Executive Functions  Concentration:Fair  Attention Span:Fair  Recall:Fair  Fund of Knowledge:Good  Language:Good   Psychomotor Activity  Psychomotor Activity:Psychomotor Activity: Normal   Assets  Assets:Communication Skills   Sleep  Sleep:Sleep: Fair    Physical Exam: Physical Exam Vitals reviewed.  Neurological:     Mental Status: He is alert and oriented to person, place, and time.  Psychiatric:  Mood and Affect: Mood normal.         Thought Content: Thought content normal.    Review of Systems  Psychiatric/Behavioral: Positive for depression and suicidal ideas. Negative for hallucinations. The patient is nervous/anxious.   All other systems reviewed and are negative.  Blood pressure (!) 140/91, pulse 89, temperature (!) 97.5 F (36.4 C), temperature source Oral, resp. rate 18, height 5\' 10"  (1.778 m), weight 93.9 kg, SpO2 100 %. Body mass index is 29.7 kg/m.  Treatment Plan Summary: Daily contact with patient to assess and evaluate symptoms and progress in treatment and Medication management   Restart Prozac 10 mg daily for depression and Anxiety  - with titration -Continue hydroxyzine 25 mg p.o. twice daily as needed -Continue trazodone 50 mg p.o. nightly as needed  Observation Level/Precautions:  Continuous Observation  Laboratory:  CBC Chemistry Profile HbAIC UA reviewed   Psychotherapy: Individual  Medications:  See chart  Consultations: CSW and psychiatry  Discharge Concerns: Safety, stabilization, and risk of access to medication and medication stabilization   Estimated LOS: 5 to 7 days  Other:     Physician Treatment Plan for Primary Diagnosis: Suicide attempt [facility]) Long Term Goal(s): Improvement in symptoms so as ready for discharge  Short Term Goals: Ability to identify changes in lifestyle to reduce recurrence of condition will improve, Ability to disclose and discuss suicidal ideas, Ability to demonstrate self-control will improve and Ability to maintain clinical measurements within normal limits will improve  Physician Treatment Plan for Secondary Diagnosis: Principal Problem:   Suicide attempt [facility])  Long Term Goal(s): Improvement in symptoms so as ready for discharge  Short Term Goals: Ability to identify and develop effective coping behaviors will improve, Ability to maintain clinical measurements within normal limits will improve and Compliance with prescribed medications will  improve  I certify that inpatient services furnished can reasonably be expected to improve the patient's condition.    [facility], NP 3/12/20229:22 AM

## 2020-08-15 DIAGNOSIS — T1491XA Suicide attempt, initial encounter: Secondary | ICD-10-CM | POA: Diagnosis not present

## 2020-08-15 MED ORDER — MIRTAZAPINE 7.5 MG PO TABS
7.5000 mg | ORAL_TABLET | Freq: Every day | ORAL | Status: DC
  Administered 2020-08-15: 7.5 mg via ORAL
  Filled 2020-08-15 (×3): qty 1

## 2020-08-15 NOTE — Progress Notes (Signed)
   08/15/20 2230  Psych Admission Type (Psych Patients Only)  Admission Status Involuntary  Psychosocial Assessment  Patient Complaints None  Eye Contact Fair  Facial Expression Animated  Affect Appropriate to circumstance  Speech Logical/coherent  Interaction Assertive  Motor Activity Fidgety  Appearance/Hygiene Unremarkable  Behavior Characteristics Appropriate to situation  Mood Pleasant  Thought Process  Coherency WDL  Content WDL  Delusions None reported or observed  Perception WDL  Hallucination None reported or observed  Judgment Impaired  Confusion None  Danger to Self  Current suicidal ideation? Denies  Agreement Not to Harm Self Yes  Description of Agreement verbally contracts for safety  Danger to Others  Danger to Others None reported or observed

## 2020-08-15 NOTE — [facility] Group Notes (Signed)
[facility] LCSW Group Therapy Note  08/15/2020    Type of Therapy and Topic:  Group Therapy:  Adding Supports Including Yourself  Participation Level:  Minimal   Description of Group:   Patients in this group were introduced to the concept that additional supports including self-support are an essential part of recovery.  Patients listed their current healthy and unhealthy supports, and discussed the difference between the two.   Several songs were played and a group discussion ensued in which patients stated they could relate to the songs which inspired them to realize they have be willing to help themselves in order to succeed, because other people cannot achieve sobriety or stability for them.  Parents were encouraged toward self-advocacy and self-support as part of their recovery.  They discussed their reactions to these songs' messages, which were positive and hopeful.  Before group ended, they identified the supports they believe they need to add to their lives to achieve their goals at discharge.   Therapeutic Goals: 1)  explain the difference between healthy and unhealthy supports and discuss what specific supports are currently in patients' lives 2)  demonstrate the importance of being a key part of one's own support system 3)  discuss the need for appropriate boundaries with supports 4)  elicit ideas from patients about supports that need to be added in order to achieve goals   Summary of Patient Progress:   The patient listed current healthy supports as his best friend, his little brother, his mother and his family and current unhealthy supports as himself.  Prior to the end of group, patient expressed that supports he needs to add at discharge include helping himself more.  He was quiet throughout group except when called on specifically.    Therapeutic Modalities:   Motivational Interviewing Activity  Lynnell Chad

## 2020-08-15 NOTE — [facility] Counselor (Signed)
Adult Comprehensive Assessment  Patient ID: Johnny Delgado, male   DOB: 1995-09-06, 25 y.o.   MRN: [id number]  Information Source: Information source: Patient  Current Stressors:  Patient states their primary concerns and needs for treatment are:: Address my depression and ADHD Patient states their goals for this hospitilization and ongoing recovery are:: " Looking at myself in better light" Educational / Learning stressors: no Employment / Job issues: no Family Relationships: Working my relationship with dad, he abruptly moved to [place] and since he has back we are trying to repair our relationship. Financial / Lack of resources (include bankruptcy): yes -rent, car payment, medical bills etc Housing / Lack of housing: no Physical health (include injuries & life threatening diseases): no Social relationships: no Substance abuse: no Bereavement / Loss: fear of losing people  Living/Environment/Situation:  Living Arrangements: Non-relatives/Friends Living conditions (as described by patient or guardian): good Who else lives in the home?: roommate How long has patient lived in current situation?: 02/2019 What is atmosphere in current home: Loving,Supportive,Comfortable  Family History:  Marital status: Single Are you sexually active?: Yes What is your sexual orientation?: heterosexual Has your sexual activity been affected by drugs, alcohol, medication, or emotional stress?: no Does patient have children?: No  Childhood History:  By whom was/is the patient raised?: Both parents Description of patient's relationship with caregiver when they were a child: with mom good, father was a Education officer, environmental and "the family was kind of in a fishbowl" and felt pressure to be perfect. Patient's description of current relationship with people who raised him/her: It is still good with mom, with dad still working on repairing relationship How were you disciplined when you got in trouble as a  child/adolescent?: spanking Does patient have siblings?: Yes Number of Siblings: 2 Description of patient's current relationship with siblings: one sister and one brother good Did patient suffer any verbal/emotional/physical/sexual abuse as a child?: No Did patient suffer from severe childhood neglect?: No Has patient ever been sexually abused/assaulted/raped as an adolescent or adult?: No Was the patient ever a victim of a crime or a disaster?: No (grandma died) Witnessed domestic violence?: No Has patient been affected by domestic violence as an adult?: No  Education:  Highest grade of school patient has completed: High school graduate and one year of college Currently a student?: No Learning disability?: No  Employment/Work Situation:   Employment situation: Employed Where is patient currently employed?: [employer] How long has patient been employed?: 15 months Patient's job has been impacted by current illness: Yes Describe how patient's job has been impacted: difficulty in focus What is the longest time patient has a held a job?: 3 years Where was the patient employed at that time?: [employer] Has patient ever been in the [employer]?: Yes (Describe in comment) (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)  Financial Resources:   Financial resources: Income from employment  Alcohol/Substance Abuse:   What has been your use of drugs/alcohol within the last 12 months?: once per month delta-8 If attempted suicide, did drugs/alcohol play a role in this?: No Alcohol/Substance Abuse Treatment Hx: Denies past history Has alcohol/substance abuse ever caused legal problems?: No  Social Support System:   Patient's Community Support System: Good Describe Community Support System: my brother, my mother and best friend Johnny Delgado Type of faith/religion: [employer]ri[employer] Knuckles How does patient's faith help to cope with current illness?: Sense of belonging  Leisure/Recreation:   Do You  Have Hobbies?: Yes Leisure and Hobbies: Anime, music, video  games, being with friends  Strengths/Needs:   What is the patient's perception of their strengths?: I can draw, friendly, sociable, people pleaser Patient states they can use these personal strengths during their treatment to contribute to their recovery: Be a better lisener, Patient states these barriers may affect/interfere with their treatment: financial Patient states these barriers may affect their return to the community: no Other important information patient would like considered in planning for their treatment: no  Discharge Plan:   Currently receiving community mental health services: No Patient states concerns and preferences for aftercare planning are: medication( have friend administer med) go to therapy Patient states they will know when they are safe and ready for discharge when: Now Does patient have access to transportation?: Yes Does patient have financial barriers related to discharge medications?: No  Summary/Recommendations:   Summary and Recommendations (to be completed by the evaluator): Johnny Delgado 25 year old Caucasian male presents after intentional overdose.  Pearly stated " I have learned my lesson the hard way."  Reports feeling stressed and overwhelmed for the past 3 to 5 months.  States he was followed by mental health in the past however, due to financial restraints has been unable to keep follow-up appointments. Stated he was prescribed Prozac, trazodone and hydroxyzine however denied that he has been taking medications consistently.  Reported previous inpatient admissions. "  Overnight hospitalization"  chart reviewed patient has a history of major depressive disorder, attention deficit disorder and anxiety.   Patient will benefit from crisis stabilization, medication evaluation, group therapy and psychoeducation, in addition to case management for discharge planning. At discharge it is recommended that  Patient adhere to the established discharge plan and continue in treatment.  Anticipated outcomes: Mood will be stabilized, crisis will be stabilized, medications will be established if appropriate, coping skills will be taught and practiced, family session will be done to determine discharge plan, mental illness will be normalized, patient will be better equipped to recognize symptoms and ask for assistance.   Johnny Delgado. 08/15/2020

## 2020-08-15 NOTE — Progress Notes (Signed)
   08/15/20 2223  COVID-19 Daily Checkoff  Have you had a fever (temp > 37.80C/100F)  in the past 24 hours?  No  If you have had runny nose, nasal congestion, sneezing in the past 24 hours, has it worsened? No  COVID-19 EXPOSURE  Have you traveled outside the state in the past 14 days? No  Have you been in contact with someone with a confirmed diagnosis of COVID-19 or PUI in the past 14 days without wearing appropriate PPE? No  Have you been living in the same home as a person with confirmed diagnosis of COVID-19 or a PUI (household contact)? No  Have you been diagnosed with COVID-19? No

## 2020-08-15 NOTE — Progress Notes (Signed)
[facility] Group Notes:  (Nursing/MHT/Case Management/Adjunct)  Date:  08/15/2020  Time:  2015 Type of Therapy:  wrap up group  Participation Level:  Active  Participation Quality:  Appropriate, Attentive, Sharing and Supportive  Affect:  Appropriate  Cognitive:  Appropriate  Insight:  Improving  Engagement in Group:  Engaged  Modes of Intervention:  Clarification, Education and Support  Summary of Progress/Problems: Positive thinking and self-care were discussed.    Marcille Buffy 08/15/2020, 8:57 PM

## 2020-08-15 NOTE — Progress Notes (Signed)
[facility] MD Progress Note  08/15/2020 12:03 PM Johnny Delgado  MRN:  [id number]   Subjective:  Arval reported " I need to be restarted on Adderall or something comparable and who should I talked to about a getting an emotional support animal long term"  Laronn observed interacting with peers in the day room.  He presents with a brighter affect than on yesterday.  Denying suicidal or homicidal ideations does endorse depression rating his depression 8 out of 10 with 10 being the worst.  Reported taking medications as indicated denying any medication side effects.  Reports concern with inability to concentrate and racing thoughts mainly at night.  Discussed initiating Remeron to assist with insomnia.  He was receptive to plan.   Discussed keeping follow-up appointment with outpatient providers to follow-up with ADHD testing and consideration with medication for this diagnosis. Charted history with substance abuse and medication none compliances. Malikhi stated that he has been able to follow-up with his family since his admission. States his family has been supportive.  Patient to consider follow-up with partial hospitalization-[facility] at discharge. Encouraged to participate with daily group session.   Support, encouragement and  reassurance was provided.  Principal Problem: Suicide attempt [employer]) Diagnosis: Principal Problem:   Suicide attempt (HCC)  Total Time spent with patient: 15 minutes  Past Psychiatric History:   Past Medical History:  Past Medical History:  Diagnosis Date  . ADHD (attention deficit hyperactivity disorder)   . Anxiety   . Depression     Past Surgical History:  Procedure Laterality Date  . WISDOM TOOTH EXTRACTION Bilateral 2015   All 4 extracted   Family History:  Family History  Problem Relation Age of Onset  . Depression Mother   . Drug abuse Mother   . Depression Father   . Drug abuse Father   . ADD / ADHD Brother   . Drug abuse Paternal Grandmother    Family  Psychiatric  History:  Social History:  Social History   Substance and Sexual Activity  Alcohol Use No     Social History   Substance and Sexual Activity  Drug Use No    Social History   Socioeconomic History  . Marital status: Single    Spouse name: Not on file  . Number of children: Not on file  . Years of education: Not on file  . Highest education level: Not on file  Occupational History  . Not on file  Tobacco Use  . Smoking status: Never Smoker  . Smokeless tobacco: Never Used  Vaping Use  . Vaping Use: Some days  Substance and Sexual Activity  . Alcohol use: No  . Drug use: No  . Sexual activity: Never  Other Topics Concern  . Not on file  Social History Narrative  . Not on file   Social Determinants of Health   Financial Resource Strain: Not on file  Food Insecurity: Not on file  Transportation Needs: Not on file  Physical Activity: Not on file  Stress: Not on file  Social Connections: Not on file   Additional Social History:                         Sleep: Fair  Appetite:  Fair  Current Medications: Current Facility-Administered Medications  Medication Dose Route Frequency Provider Last Rate Last Admin  . alum & mag hydroxide-simeth (MAALOX/MYLANTA) 200-200-20 MG/5ML suspension 30 mL  30 mL Oral Q4H PRN Antonieta Pert, MD      .  docusate sodium (COLACE) capsule 100 mg  100 mg Oral BID PRN Antonieta Pert, MD      . famotidine (PEPCID) tablet 20 mg  20 mg Oral BID Antonieta Pert, MD   20 mg at 08/15/20 0758  . FLUoxetine (PROZAC) capsule 10 mg  10 mg Oral Daily Oneta Rack, NP   10 mg at 08/15/20 0758  . hydrOXYzine (ATARAX/VISTARIL) tablet 25 mg  25 mg Oral TID PRN Antonieta Pert, MD      . ibuprofen (ADVIL) tablet 400 mg  400 mg Oral Q6H PRN Antonieta Pert, MD   400 mg at 08/13/20 2122  . magnesium hydroxide (MILK OF MAGNESIA) suspension 30 mL  30 mL Oral Daily PRN Antonieta Pert, MD      . ondansetron  [facility]) tablet 8 mg  8 mg Oral Q8H PRN Antonieta Pert, MD      . polyethylene glycol (MIRALAX / GLYCOLAX) packet 17 g  17 g Oral Daily PRN Antonieta Pert, MD      . traZODone (DESYREL) tablet 50 mg  50 mg Oral QHS PRN Antonieta Pert, MD   50 mg at 08/14/20 2125    Lab Results: No results found for this or any previous visit (from the past 48 hour(s)).  Blood Alcohol level:  Lab Results  Component Value Date   ETH <10 08/10/2020   ETH <10 09/28/2017    Metabolic Disorder Labs: Lab Results  Component Value Date   HGBA1C 5.7 (H) 07/21/2017   MPG 116.89 07/21/2017   No results found for: PROLACTIN Lab Results  Component Value Date   CHOL 172 07/21/2017   TRIG 132 07/21/2017   HDL 32 (L) 07/21/2017   CHOLHDL 5.4 07/21/2017   VLDL 26 07/21/2017   LDLCALC 114 (H) 07/21/2017    Physical Findings: AIMS: Facial and Oral Movements Muscles of Facial Expression: None, normal Lips and Perioral Area: None, normal Jaw: None, normal Tongue: None, normal,Extremity Movements Upper (arms, wrists, hands, fingers): None, normal Lower (legs, knees, ankles, toes): None, normal, Trunk Movements Neck, shoulders, hips: None, normal, Overall Severity Severity of abnormal movements (highest score from questions above): None, normal Incapacitation due to abnormal movements: None, normal Patient's awareness of abnormal movements (rate only patient's report): No Awareness, Dental Status Current problems with teeth and/or dentures?: No Does patient usually wear dentures?: No  CIWA:    COWS:     Musculoskeletal: Strength & Muscle Tone: within normal limits Gait & Station: normal Patient leans: N/A  Psychiatric Specialty Exam:  Presentation  General Appearance: Appropriate for Environment  Eye Contact:Minimal  Speech:Clear and Coherent  Speech Volume:Decreased  Handedness:Left   Mood and Affect  Mood:Depressed  Affect:Congruent   Thought Process  Thought  Processes:Coherent  Descriptions of Associations:Intact  Orientation:Full (Time, Place and Person)  Thought Content:Logical  History of Schizophrenia/Schizoaffective disorder:No data recorded Duration of Psychotic Symptoms:No data recorded Hallucinations:Hallucinations: None  Ideas of Reference:None  Suicidal Thoughts:Suicidal Thoughts: Yes, Active SI Active Intent and/or Plan: Without Intent  Homicidal Thoughts:Homicidal Thoughts: No   Sensorium  Memory:Immediate Fair; Recent Fair  Judgment:Intact  Insight:Lacking   Executive Functions  Concentration:Fair  Attention Span:Fair  Recall:Fair  Fund of Knowledge:Good  Language:Good   Psychomotor Activity  Psychomotor Activity:Psychomotor Activity: Normal   Assets  Assets:Communication Skills   Sleep  Sleep:Sleep: Fair    Physical Exam: Physical Exam Vitals reviewed.  Neurological:     Mental Status: He is alert.  Psychiatric:  Mood and Affect: Mood normal.        Thought Content: Thought content normal.    Review of Systems  Psychiatric/Behavioral: Positive for depression. Negative for suicidal ideas. The patient is nervous/anxious.   All other systems reviewed and are negative.  Blood pressure (!) 130/93, pulse (!) 101, temperature (!) 97.5 F (36.4 C), temperature source Oral, resp. rate 16, height 5\' 10"  (1.778 m), weight 93.9 kg, SpO2 100 %. Body mass index is 29.7 kg/m.   Treatment Plan Summary: Daily contact with patient to assess and evaluate symptoms and progress in treatment and Medication management   Continue with current treatment plan on 08/16/2018 2020 as listed below except were noted  Continue Prozac 10 mg p.o. daily for depression Continue hydroxyzine 25 mg p.o. as needed 3 times daily for anxiety Initiated Remeron 7.5 mg p.o. nightly for mood stabilization Discontinue trazodone 50 mg p.o. nightly  CSW to continue working on discharge disposition Patient encouraged  to participate within the therapeutic milieu  2021, NP 08/15/2020, 12:03 PM

## 2020-08-15 NOTE — [facility] Group Notes (Signed)
Adult Psychoeducational Group Note  Date:  08/15/2020 Time:  10:31 AM  Group Topic/Focus:  Wellness Toolbox:   The focus of this group is to discuss various aspects of wellness, balancing those aspects and exploring ways to increase the ability to experience wellness.  Patients will create a wellness toolbox for use upon discharge.  Participation Level:  Active  Participation Quality:  Appropriate  Affect:  Appropriate  Cognitive:  Appropriate  Insight: Appropriate  Engagement in Group:  Engaged  Modes of Intervention:  Discussion  Additional Comments:  Patient attended morning orientation and wellness group and participated.     Callaghan Laverdure W Keilyn Nadal 08/15/2020, 10:31 AM

## 2020-08-15 NOTE — Progress Notes (Signed)
Patient rates depression 4/10 and anxiety 6/10. He denies SI/HI. He reports fair sleep at night. He reports having a fair appetite today. He requested to speak with the doctor about starting Adderall for ADHD, NP made aware. His stated goal "to try and be myself better."   Orders reviewed. Vital signs reviewed. Verbal support provided. 15 minute checks performed for safety.   Patient compliant with treatment plan.

## 2020-08-16 MED ORDER — FLUOXETINE HCL 20 MG PO CAPS
20.0000 mg | ORAL_CAPSULE | Freq: Every day | ORAL | Status: DC
  Administered 2020-08-17: 20 mg via ORAL
  Filled 2020-08-16 (×4): qty 1

## 2020-08-16 MED ORDER — MIRTAZAPINE 15 MG PO TABS
15.0000 mg | ORAL_TABLET | Freq: Every day | ORAL | Status: DC
Start: 2020-08-16 — End: 2020-08-17
  Administered 2020-08-16: 15 mg via ORAL
  Filled 2020-08-16 (×4): qty 1

## 2020-08-16 NOTE — Progress Notes (Signed)
Patient rated his day as an 8 out of a possible 10 since he met his peers today and socialized. His goal for tomorrow is to get discharged.

## 2020-08-16 NOTE — Progress Notes (Signed)
D:  Patient's self inventory sheet, patient sleeps good, sleep medication helpful.  Fair appetite, normal energy level, poor concentration.  Rated depression 2, hopeless   1, anxiety 4.  Denied withdrawals.  Denied SI.  Denied physical problems.  Denied physical pain.  Goal to discuss discharge plan.  Will talk to MD.  No discharge plans. A:  Medications administered per MD orders.  Emotional  support and encouragement given patient. R:  Denied SI and HI, contracts for safety.  Denied A/V hallucinations.  Safety maintained with 15 minute checks.

## 2020-08-16 NOTE — [facility] Group Notes (Signed)
Occupational Therapy Group Note Date: 08/16/2020 Group Topic/Focus: Stress Management  Group Description: Group encouraged increased participation and engagement through discussion focused on topic of stress management. Patients engaged interactively to discuss components of stress including physical signs, emotional signs, negative management strategies, and positive management strategies. Each individual identified one new stress management strategy they would like to try moving forward.    Therapeutic Goals: Identify current stressors Identify healthy vs unhealthy stress management strategies/techniques Discuss and identify physical and emotional signs of stress Participation Level: Active   Participation Quality: Independent   Behavior: Calm and Cooperative   Speech/Thought Process: Focused   Affect/Mood: Full range   Insight: Fair   Judgement: Fair   Individualization: Johnny Delgado was active in their participation of group discussion/activity. Pt identified "deadlines at work" as something that is stressful. Pt identified "music" as something that has been helpful in the past to manage his stress and feeling overwhelmed.   Modes of Intervention: Discussion, Education and Socialization  Patient Response to Interventions:  Attentive, Engaged and Receptive   Plan: Continue to engage patient in OT groups 2 - 3x/week.  08/16/2020  Donne Hazel, MOT, OTR/L

## 2020-08-16 NOTE — Progress Notes (Signed)
Recreation Therapy Notes  Date:  3.14.22 Time: 0930 Location: [address] Dayroom  Group Topic: Stress Management  Goal Area(s) Addresses:  Patient will identify positive stress management techniques. Patient will identify benefits of using stress management post d/c.  Behavioral Response: Engaged  Intervention: Stress Management  Activity:  Meditation.  LRT played a meditation that focused on making the most of each moment in your day.  It also talked about focusing on what you want to accomplish as you move through the day.  Patients were to listen and follow along as meditation played to fully engage in activity.    Education:  Stress Management, Discharge Planning.   Education Outcome: Acknowledges Education  Clinical Observations/Feedback: Pt attended and participated in activity.  Pt was able to sit quietly and engage.    Caroll Rancher, LRT/CTRS    Caroll Rancher A 08/16/2020 10:47 AM

## 2020-08-16 NOTE — Plan of Care (Signed)
Nurse discussed coping skills with patient.  

## 2020-08-16 NOTE — Progress Notes (Signed)
Pt said that he is working on his self-worth. 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 Pt has been using reaffirmation as a coping skill and leaning on others. He identifies his support people as his family and best friend. He denies having any side effects from his medications besides feeling "a little shaky" in the morning when he first wakes up. Pt attended group tonight and has been appropriate on the unit. Pt denies SI/HI and AVH. Active listening, reassurance, and support provided. Medications administered as ordered by provider. Q 15 min safety checks continue. Pt's safety has been maintained.   08/16/20 2123  Psych Admission Type (Psych Patients Only)  Admission Status Involuntary  Psychosocial Assessment  Patient Complaints Anxiety;Depression;Worrying  Eye Contact Fair  Facial Expression Animated;Anxious  Affect Anxious;Appropriate to circumstance  Speech Logical/coherent  Interaction Assertive  Motor Activity Fidgety  Appearance/Hygiene Unremarkable  Behavior Characteristics Cooperative;Anxious;Fidgety  Mood Anxious;Pleasant  Thought Process  Coherency WDL  Content WDL  Delusions None reported or observed  Perception WDL  Hallucination None reported or observed  Judgment WDL  Confusion None  Danger to Self  Current suicidal ideation? Denies  Danger to Others  Danger to Others None reported or observed

## 2020-08-16 NOTE — [facility] Group Notes (Signed)
Type of Therapy and Topic: Group Therapy: Overcoming Obstacles  Participation Level: Active  Description of Group:  In this group patients will watch a Ted Talk titled, "Why We Should All Try Therapy". In this video, patients explore the benefits of going to therapy and how this choice may effect their life. This video also explores other emotions that may arise by attending therapy. Patients were also given a worksheet titled "Therapy Goals" that allows patients to explore their goals for therapy and how their life may be different once they have completed therapy.    Therapeutic Goals:  1. Patient will identify personal and current emotions as they relate to therapy.  2. Patient will identify barriers that currently interfere with them beginning therapy.  3. Patient will identify two goals they are willing to set for therapy.     Summary of Patient Progress: Pt received handout and followed along throughout the session. Pt shared during introductions that his favorite color is purple but he is not sure why. Pt was attentive during the Ted Talk. Pt shared that his goal for therapy is working on his self-worth.   Fredirick Lathe, LCSWA Clinicial Social Worker [employer]

## 2020-08-16 NOTE — Progress Notes (Signed)
[facility] MD Progress Note  08/16/2020 4:32 PM Johnny Delgado  MRN:  [id number] Subjective: Patient is a 25 year old male with a past psychiatric history significant for depression and anxiety as well as attention deficit hyperactivity disorder who was originally admitted on 08/10/2020 to the [facility] emergency after an intentional overdose.  The patient was suspected to have taken as much as 150-200 Tylenol tablets, 10-15 fluoxetine tablets, 10-15 Klonopin tablets and 10-15 hydroxyzine tablets.  The patient had had 1 bout of emesis in route to the hospital, but and pill fragments were noted in the vomitus.  He was intubated in the emergency room for altered mental status and airway protection.  Psychiatric consultation was obtained on 08/10/2020 and recommendations were to transfer the patient to the psychiatric hospital when medically stable.  The patient was transferred to our facility on 08/14/2020.  At that time he denied suicidal ideation but clearly appeared depressed.  On admission his fluoxetine at 10 mg p.o. daily was restarted, and he continued on hydroxyzine as well as trazodone.  On 3/13 he was rating his depression an 8 out of 10 with 10 being the worst.  He wanted to see if he could be restarted in Adderall for his ADHD.  He was also seeking information about a support animal.  Mirtazapine had also been started for sleep as well as augmenting his antidepressant medications.  On examination today he stated he is feeling a bit better.  He still appears affectively to be depressed.  He denied suicidal ideation, and stated that he is spoken with his family and friends with regard to his regret over what occurred.  Recommendations over the weekend were to attempt to get him involved in the partial hospital program at the behavioral health urgent care center at discharge.  Today we discussed the possibility of increasing his fluoxetine.  Review of his electronic medical record revealed that  he had previously been on as much as 60 mg a day.  He stated he had only been off the fluoxetine since approximately 30 days.  His blood pressure still mildly elevated at a blood pressure of 122/99.  Earlier it was 142/105.  Pulse was 96.  He is afebrile.  Previous pulse oximetry was 100% on room air.  Unfortunately his sleep was not recorded.  Review of his laboratories revealed no new laboratories.  His last liver function enzymes on 08/13/2020 were normal.  His platelet count as of 3/11 was 112,000.  As discussed in his H&P and suicide risk assessment note on admission his platelet count at 112,000 is better than 1 year ago when it was 61,000.  No other new laboratories.  He denied auditory or visual hallucinations.  He denied suicidal or homicidal ideation.  Principal Problem: Suicide attempt [facility]) Diagnosis: Principal Problem:   Suicide attempt (HCC)  Total Time spent with patient: 20 minutes  Past Psychiatric History: See admission H&P  Past Medical History:  Past Medical History:  Diagnosis Date  . ADHD (attention deficit hyperactivity disorder)   . Anxiety   . Depression     Past Surgical History:  Procedure Laterality Date  . WISDOM TOOTH EXTRACTION Bilateral 2015   All 4 extracted   Family History:  Family History  Problem Relation Age of Onset  . Depression Mother   . Drug abuse Mother   . Depression Father   . Drug abuse Father   . ADD / ADHD Brother   . Drug abuse Paternal Grandmother  Family Psychiatric  History: See admission H&P Social History:  Social History   Substance and Sexual Activity  Alcohol Use No     Social History   Substance and Sexual Activity  Drug Use No    Social History   Socioeconomic History  . Marital status: Single    Spouse name: Not on file  . Number of children: Not on file  . Years of education: Not on file  . Highest education level: Not on file  Occupational History  . Not on file  Tobacco Use  . Smoking status: Never  Smoker  . Smokeless tobacco: Never Used  Vaping Use  . Vaping Use: Some days  Substance and Sexual Activity  . Alcohol use: No  . Drug use: No  . Sexual activity: Never  Other Topics Concern  . Not on file  Social History Narrative  . Not on file   Social Determinants of Health   Financial Resource Strain: Not on file  Food Insecurity: Not on file  Transportation Needs: Not on file  Physical Activity: Not on file  Stress: Not on file  Social Connections: Not on file   Additional Social History:                         Sleep: Fair  Appetite:  Fair  Current Medications: Current Facility-Administered Medications  Medication Dose Route Frequency Provider Last Rate Last Admin  . alum & mag hydroxide-simeth (MAALOX/MYLANTA) 200-200-20 MG/5ML suspension 30 mL  30 mL Oral Q4H PRN Antonieta Pert, MD      . docusate sodium (COLACE) capsule 100 mg  100 mg Oral BID PRN Antonieta Pert, MD      . famotidine (PEPCID) tablet 20 mg  20 mg Oral BID Antonieta Pert, MD   20 mg at 08/16/20 0753  . FLUoxetine (PROZAC) capsule 10 mg  10 mg Oral Daily Oneta Rack, NP   10 mg at 08/16/20 7262  . hydrOXYzine (ATARAX/VISTARIL) tablet 25 mg  25 mg Oral TID PRN Antonieta Pert, MD   25 mg at 08/15/20 2135  . ibuprofen (ADVIL) tablet 400 mg  400 mg Oral Q6H PRN Antonieta Pert, MD   400 mg at 08/15/20 1808  . magnesium hydroxide (MILK OF MAGNESIA) suspension 30 mL  30 mL Oral Daily PRN Antonieta Pert, MD      . mirtazapine (REMERON) tablet 7.5 mg  7.5 mg Oral QHS Oneta Rack, NP   7.5 mg at 08/15/20 2135  . ondansetron (ZOFRAN) tablet 8 mg  8 mg Oral Q8H PRN Antonieta Pert, MD      . polyethylene glycol (MIRALAX / GLYCOLAX) packet 17 g  17 g Oral Daily PRN Antonieta Pert, MD        Lab Results: No results found for this or any previous visit (from the past 48 hour(s)).  Blood Alcohol level:  Lab Results  Component Value Date   ETH <10 08/10/2020    ETH <10 09/28/2017    Metabolic Disorder Labs: Lab Results  Component Value Date   HGBA1C 5.7 (H) 07/21/2017   MPG 116.89 07/21/2017   No results found for: PROLACTIN Lab Results  Component Value Date   CHOL 172 07/21/2017   TRIG 132 07/21/2017   HDL 32 (L) 07/21/2017   CHOLHDL 5.4 07/21/2017   VLDL 26 07/21/2017   LDLCALC 114 (H) 07/21/2017    Physical Findings: AIMS: Facial and  Oral Movements Muscles of Facial Expression: None, normal Lips and Perioral Area: None, normal Jaw: None, normal Tongue: None, normal,Extremity Movements Upper (arms, wrists, hands, fingers): None, normal Lower (legs, knees, ankles, toes): None, normal, Trunk Movements Neck, shoulders, hips: None, normal, Overall Severity Severity of abnormal movements (highest score from questions above): None, normal Incapacitation due to abnormal movements: None, normal Patient's awareness of abnormal movements (rate only patient's report): No Awareness, Dental Status Current problems with teeth and/or dentures?: No Does patient usually wear dentures?: No  CIWA:    COWS:     Musculoskeletal: Strength & Muscle Tone: within normal limits Gait & Station: normal Patient leans: N/A  Psychiatric Specialty Exam:  Presentation  General Appearance: Appropriate for Environment  Eye Contact:Minimal  Speech:Clear and Coherent  Speech Volume:Decreased  Handedness:Left   Mood and Affect  Mood:Depressed  Affect:Congruent   Thought Process  Thought Processes:Coherent  Descriptions of Associations:Intact  Orientation:Full (Time, Place and Person)  Thought Content:Logical  History of Schizophrenia/Schizoaffective disorder:No data recorded Duration of Psychotic Symptoms:No data recorded Hallucinations:No data recorded Ideas of Reference:None  Suicidal Thoughts:No data recorded Homicidal Thoughts:No data recorded  Sensorium  Memory:Immediate Fair; Recent  Fair  Judgment:Intact  Insight:Lacking   Executive Functions  Concentration:Fair  Attention Span:Fair  Recall:Fair  Fund of Knowledge:Good  Language:Good   Psychomotor Activity  Psychomotor Activity:No data recorded  Assets  Assets:Communication Skills   Sleep  Sleep:No data recorded   Physical Exam: Physical Exam Vitals and nursing note reviewed.  Constitutional:      Appearance: Normal appearance.  HENT:     Head: Normocephalic and atraumatic.  Pulmonary:     Effort: Pulmonary effort is normal.  Neurological:     General: No focal deficit present.     Mental Status: He is alert and oriented to person, place, and time.    ROS Blood pressure (!) 122/99, pulse (!) 115, temperature (!) 97.5 F (36.4 C), temperature source Oral, resp. rate 16, height 5\' 10"  (1.778 m), weight 93.9 kg, SpO2 100 %. Body mass index is 29.7 kg/m.   Treatment Plan Summary: Daily contact with patient to assess and evaluate symptoms and progress in treatment, Medication management and Plan : Patient is seen and examined.  Patient is a 25 year old male with the above-stated past psychiatric history who is seen in follow-up.   Diagnosis: 1.  Major depression, recurrent, severe without psychotic features 2.  Generalized anxiety disorder. 3.  Attention deficit hyperactivity disorder. 4.  Thrombocytopenia 5.  Hypertension  Pertinent findings on examination today: 1.  Patient denies suicidal ideation. 2.  Affectively he still appears depressed. 3.  Still some GI discomfort from his overdose.  Plan: 1.  Increase fluoxetine to 20 mg p.o. daily for anxiety and depression. 2.  Continue Colace 100 mg p.o. twice daily as needed constipation. 3.  Continue Pepcid 20 mg p.o. twice daily for gastric dysfunction. 4.  Continue hydroxyzine 25 mg p.o. 3 times daily as needed anxiety. 5.  Continue ibuprofen 400 mg p.o. every 6 hours as needed fever, headache or pain. 6.  Increase mirtazapine to  15 mg p.o. nightly for depression, anxiety, appetite stimulation and sleep. 7.  Continue Zofran 8 mg p.o. every 8 hours as needed nausea. 8.  Continue MiraLAX 17 g in 8 ounces water p.o. daily as needed constipation 9.  Disposition planning-in progress.  30, MD 08/16/2020, 4:32 PM

## 2020-08-16 NOTE — Tx Team (Signed)
Interdisciplinary Treatment and Diagnostic Plan Update  08/16/2020 Time of Session: 9:00am [name] MRN: [id number]  Principal Diagnosis: Suicide attempt [facility])  Secondary Diagnoses: Principal Problem:   Suicide attempt [facility])   Current Medications:  Current Facility-Administered Medications  Medication Dose Route Frequency Provider Last Rate Last Admin  . alum & mag hydroxide-simeth (MAALOX/MYLANTA) 200-200-20 MG/5ML suspension 30 mL  30 mL Oral Q4H PRN Sharma Covert, MD      . docusate sodium (COLACE) capsule 100 mg  100 mg Oral BID PRN Sharma Covert, MD      . famotidine (PEPCID) tablet 20 mg  20 mg Oral BID Sharma Covert, MD   20 mg at 08/16/20 0753  . FLUoxetine (PROZAC) capsule 10 mg  10 mg Oral Daily Derrill Center, NP   10 mg at 08/16/20 6387  . hydrOXYzine (ATARAX/VISTARIL) tablet 25 mg  25 mg Oral TID PRN Sharma Covert, MD   25 mg at 08/15/20 2135  . ibuprofen (ADVIL) tablet 400 mg  400 mg Oral Q6H PRN Sharma Covert, MD   400 mg at 08/15/20 1808  . magnesium hydroxide (MILK OF MAGNESIA) suspension 30 mL  30 mL Oral Daily PRN Sharma Covert, MD      . mirtazapine (REMERON) tablet 7.5 mg  7.5 mg Oral QHS Derrill Center, NP   7.5 mg at 08/15/20 2135  . ondansetron (ZOFRAN) tablet 8 mg  8 mg Oral Q8H PRN Sharma Covert, MD      . polyethylene glycol (MIRALAX / GLYCOLAX) packet 17 g  17 g Oral Daily PRN Sharma Covert, MD       PTA Medications: Medications Prior to Admission  Medication Sig Dispense Refill Last Dose  . hydrALAZINE (APRESOLINE) 25 MG tablet Take 1 tablet (25 mg total) by mouth every 6 (six) hours as needed (sbp>160).     Marland Kitchen ibuprofen (ADVIL) 200 MG tablet Take 200 mg by mouth every 6 (six) hours as needed for headache.       Patient Stressors: Financial difficulties Health problems Medication change or noncompliance Occupational concerns  Patient Strengths: Ability for insight Average or above average  intelligence Communication skills Motivation for treatment/growth  Treatment Modalities: Medication Management, Group therapy, Case management,  1 to 1 session with clinician, Psychoeducation, Recreational therapy.   Physician Treatment Plan for Primary Diagnosis: Suicide attempt [facility] Long Term Goal(s): Improvement in symptoms so as ready for discharge Improvement in symptoms so as ready for discharge   Short Term Goals: Ability to identify changes in lifestyle to reduce recurrence of condition will improve Ability to disclose and discuss suicidal ideas Ability to demonstrate self-control will improve Ability to maintain clinical measurements within normal limits will improve Ability to identify and develop effective coping behaviors will improve Ability to maintain clinical measurements within normal limits will improve Compliance with prescribed medications will improve  Medication Management: Evaluate patient's response, side effects, and tolerance of medication regimen.  Therapeutic Interventions: 1 to 1 sessions, Unit Group sessions and Medication administration.  Evaluation of Outcomes: Not Met  Physician Treatment Plan for Secondary Diagnosis: Principal Problem:   Suicide attempt [facility])  Long Term Goal(s): Improvement in symptoms so as ready for discharge Improvement in symptoms so as ready for discharge   Short Term Goals: Ability to identify changes in lifestyle to reduce recurrence of condition will improve Ability to disclose and discuss suicidal ideas Ability to demonstrate self-control will improve Ability to maintain clinical measurements within normal  limits will improve Ability to identify and develop effective coping behaviors will improve Ability to maintain clinical measurements within normal limits will improve Compliance with prescribed medications will improve     Medication Management: Evaluate patient's response, side effects, and tolerance of  medication regimen.  Therapeutic Interventions: 1 to 1 sessions, Unit Group sessions and Medication administration.  Evaluation of Outcomes: Not Met   RN Treatment Plan for Primary Diagnosis: Suicide attempt [facility]) Long Term Goal(s): Knowledge of disease and therapeutic regimen to maintain health will improve  Short Term Goals: Ability to remain free from injury will improve, Ability to verbalize frustration and anger appropriately will improve, Ability to identify and develop effective coping behaviors will improve and Compliance with prescribed medications will improve  Medication Management: RN will administer medications as ordered by provider, will assess and evaluate patient's response and provide education to patient for prescribed medication. RN will report any adverse and/or side effects to prescribing provider.  Therapeutic Interventions: 1 on 1 counseling sessions, Psychoeducation, Medication administration, Evaluate responses to treatment, Monitor vital signs and CBGs as ordered, Perform/monitor CIWA, COWS, AIMS and Fall Risk screenings as ordered, Perform wound care treatments as ordered.  Evaluation of Outcomes: Not Met   LCSW Treatment Plan for Primary Diagnosis: Suicide attempt [facility]) Long Term Goal(s): Safe transition to appropriate next level of care at discharge, Engage patient in therapeutic group addressing interpersonal concerns.  Short Term Goals: Engage patient in aftercare planning with referrals and resources, Increase social support, Increase ability to appropriately verbalize feelings, Identify triggers associated with mental health/substance abuse issues and Increase skills for wellness and recovery  Therapeutic Interventions: Assess for all discharge needs, 1 to 1 time with Social worker, Explore available resources and support systems, Assess for adequacy in community support network, Educate family and significant other(s) on suicide prevention, Complete  Psychosocial Assessment, Interpersonal group therapy.  Evaluation of Outcomes: Not Met   Progress in Treatment: Attending groups: Yes. Participating in groups: Yes. Taking medication as prescribed: Yes. Toleration medication: Yes. Family/Significant other contact made: No, will contact:  father Patient understands diagnosis: Yes. Discussing patient identified problems/goals with staff: Yes. Medical problems stabilized or resolved: Yes. Denies suicidal/homicidal ideation: Yes. Issues/concerns per patient self-inventory: No.   New problem(s) identified: No, Describe:  none'  New Short Term/Long Term Goal(s): detox, medication management for mood stabilization; elimination of SI thoughts; development of comprehensive mental wellness/sobriety plan   Patient Goals:  "To be able to handle depression"  Discharge Plan or Barriers: Patient is to return home. Patient is to follow up with [facility] for therapy and medication management.   Reason for Continuation of Hospitalization: Depression Medication stabilization Suicidal ideation Withdrawal symptoms  Estimated Length of Stay: 3-5 days  Attendees: Patient: Johnny Delgado  08/16/2020   Physician: Myles Lipps, MD 08/16/2020   Nursing:  08/16/2020   RN Care Manager: 08/16/2020   Social Worker: Darletta Moll, [place]  08/16/2020   Recreational Therapist:  08/16/2020   Other:  08/16/2020   Other:  08/16/2020   Other: 08/16/2020     Scribe for Treatment Team: Vassie Moselle, LCSW 08/16/2020 9:55 AM

## 2020-08-16 NOTE — Progress Notes (Incomplete)
[facility] MD Progress Note  08/16/2020 5:30 PM ISRRAEL FLUCKIGER  MRN:  [id number]   Subjective:  Johnny Delgado is a 25 year old male with a past psychiatric history significant for depression and anxiety as well as attention deficit hyperactivity disorder who was originally admitted on 08/10/2020 to the [facility] emergency after an intentional overdose.  The patient was suspected to have taken as much as 150-200 Tylenol tablets, 10-15 fluoxetine tablets, 10-15 Klonopin tablets and 10-15 hydroxyzine tablets.  He was intubated in the emergency room for altered mental status and airway protection.  Psychiatric consultation was obtained on 08/10/2020 and recommendations were to transfer the patient to the psychiatric hospital when medically stable.  The patient was transferred to our facility on 08/14/2020.  The patient is currently on Hospital Day 3.   Chart Review from last 24 hours:  The patient's chart was reviewed and nursing notes were reviewed. The patient's case was discussed in multidisciplinary team meeting. Per [facility]   Information Obtained Today During Patient Interview: The patient was seen and evaluated on the unit. On assessment today the patient reports ***  Principal Problem: Suicide attempt [facility]) Diagnosis: Principal Problem:   Suicide attempt (HCC)  Total Time Spent in Direct Patient Care:  I personally spent *** minutes on the unit in direct patient care. The direct patient care time included face-to-face time with the patient, reviewing the patient's chart, communicating with other professionals, and coordinating care. Greater than 50% of this time was spent in counseling or coordinating care with the patient regarding goals of hospitalization, psycho-education, and discharge planning needs.  Past Psychiatric History: see admission H&P  Past Medical History:  Past Medical History:  Diagnosis Date  . ADHD (attention deficit hyperactivity disorder)   . Anxiety   .  Depression     Past Surgical History:  Procedure Laterality Date  . WISDOM TOOTH EXTRACTION Bilateral 2015   All 4 extracted   Family History:  Family History  Problem Relation Age of Onset  . Depression Mother   . Drug abuse Mother   . Depression Father   . Drug abuse Father   . ADD / ADHD Brother   . Drug abuse Paternal Grandmother    Family Psychiatric  History: see admission H&P  Social History:  Social History   Substance and Sexual Activity  Alcohol Use No     Social History   Substance and Sexual Activity  Drug Use No    Social History   Socioeconomic History  . Marital status: Single    Spouse name: Not on file  . Number of children: Not on file  . Years of education: Not on file  . Highest education level: Not on file  Occupational History  . Not on file  Tobacco Use  . Smoking status: Never Smoker  . Smokeless tobacco: Never Used  Vaping Use  . Vaping Use: Some days  Substance and Sexual Activity  . Alcohol use: No  . Drug use: No  . Sexual activity: Never  Other Topics Concern  . Not on file  Social History Narrative  . Not on file   Social Determinants of Health   Financial Resource Strain: Not on file  Food Insecurity: Not on file  Transportation Needs: Not on file  Physical Activity: Not on file  Stress: Not on file  Social Connections: Not on file   Sleep: {BHH GOOD/FAIR/POOR:22877}  Appetite:  {BHH GOOD/FAIR/POOR:22877}  Current Medications: Current Facility-Administered Medications  Medication Dose  Route Frequency Provider Last Rate Last Admin  . alum & mag hydroxide-simeth (MAALOX/MYLANTA) 200-200-20 MG/5ML suspension 30 mL  30 mL Oral Q4H PRN Antonieta Pert, MD      . docusate sodium (COLACE) capsule 100 mg  100 mg Oral BID PRN Antonieta Pert, MD      . famotidine (PEPCID) tablet 20 mg  20 mg Oral BID Antonieta Pert, MD   20 mg at 08/16/20 1635  . [START ON 08/17/2020] FLUoxetine (PROZAC) capsule 20 mg  20 mg Oral  Daily Antonieta Pert, MD      . hydrOXYzine (ATARAX/VISTARIL) tablet 25 mg  25 mg Oral TID PRN Antonieta Pert, MD   25 mg at 08/15/20 2135  . ibuprofen (ADVIL) tablet 400 mg  400 mg Oral Q6H PRN Antonieta Pert, MD   400 mg at 08/15/20 1808  . magnesium hydroxide (MILK OF MAGNESIA) suspension 30 mL  30 mL Oral Daily PRN Antonieta Pert, MD      . mirtazapine (REMERON) tablet 15 mg  15 mg Oral QHS Antonieta Pert, MD      . ondansetron [facility]) tablet 8 mg  8 mg Oral Q8H PRN Antonieta Pert, MD      . polyethylene glycol (MIRALAX / GLYCOLAX) packet 17 g  17 g Oral Daily PRN Antonieta Pert, MD        Lab Results: No results found for this or any previous visit (from the past 48 hour(s)).  Blood Alcohol level:  Lab Results  Component Value Date   ETH <10 08/10/2020   ETH <10 09/28/2017    Metabolic Disorder Labs: Lab Results  Component Value Date   HGBA1C 5.7 (H) 07/21/2017   MPG 116.89 07/21/2017   No results found for: PROLACTIN Lab Results  Component Value Date   CHOL 172 07/21/2017   TRIG 132 07/21/2017   HDL 32 (L) 07/21/2017   CHOLHDL 5.4 07/21/2017   VLDL 26 07/21/2017   LDLCALC 114 (H) 07/21/2017    Physical Findings: AIMS: Facial and Oral Movements Muscles of Facial Expression: None, normal Lips and Perioral Area: None, normal Jaw: None, normal Tongue: None, normal,Extremity Movements Upper (arms, wrists, hands, fingers): None, normal Lower (legs, knees, ankles, toes): None, normal, Trunk Movements Neck, shoulders, hips: None, normal, Overall Severity Severity of abnormal movements (highest score from questions above): None, normal Incapacitation due to abnormal movements: None, normal Patient's awareness of abnormal movements (rate only patient's report): No Awareness, Dental Status Current problems with teeth and/or dentures?: No Does patient usually wear dentures?: No      Musculoskeletal: Strength & Muscle Tone: within normal  limits Gait & Station: normal Patient leans: N/A  Psychiatric Specialty Exam:  Presentation  General Appearance: Fairly Groomed  Eye Contact:Fair  Speech:Normal Rate  Speech Volume:Normal  Handedness:Right   Mood and Affect  Mood:Depressed  Affect:Appropriate   Thought Process  Thought Processes:Coherent  Descriptions of Associations:Intact  Orientation:Full (Time, Place and Person)  Thought Content:Logical  History of Schizophrenia/Schizoaffective disorder:No data recorded Duration of Psychotic Symptoms:No data recorded Hallucinations:Hallucinations: None  Ideas of Reference:None  Suicidal Thoughts:Suicidal Thoughts: No  Homicidal Thoughts:Homicidal Thoughts: No   Sensorium  Memory:Immediate Good; Recent Good; Remote Good  Judgment:Intact  Insight:Fair   Executive Functions  Concentration:Fair  Attention Span:Good  Recall:Good  Fund of Knowledge:Good  Language:Good   Psychomotor Activity  Psychomotor Activity:Psychomotor Activity: Decreased   Assets  Assets:Communication Skills; Desire for Improvement; Financial Resources/Insurance; Housing; Social Support; Resilience; Talents/Skills;  Vocational/Educational   Sleep  Sleep:No data recorded  Physical Exam: Physical Exam Vitals reviewed.  HENT:     Head: Normocephalic.  Pulmonary:     Effort: Pulmonary effort is normal.  Neurological:     Mental Status: He is alert.    ROS Blood pressure (!) 122/99, pulse (!) 115, temperature (!) 97.5 F (36.4 C), temperature source Oral, resp. rate 16, height 5\' 10"  (1.778 m), weight 93.9 kg, SpO2 100 %. Body mass index is 29.7 kg/m.  Treatment Plan Summary: Diagnoses / Active Problems: MDD recurrent severe without psychotic features GAD ADHD by hx HTN Thrombocytopenia  PLAN: 1. Safety and Monitoring:  -- Voluntary admission to inpatient psychiatric unit for safety, stabilization and treatment  -- Daily contact with patient to assess  and evaluate symptoms and progress in treatment  -- Patient's case to be discussed in multi-disciplinary team meeting  -- Observation Level : q15 minute checks  -- Vital signs:  q12 hours  -- Precautions: suicide  2. Psychiatric Diagnoses and Treatment:   MDD recurrent severe without psychotic features  GAD  ADHD by hx -- Continue Prozac 20mg  daily for anxiety and depression -- Continue Remeron 15mg  po qhs for depression, anxiety, sleep, and appetite stimulation -- FDA  Black box warning for risk of potentially developign SI in patients in his age range who are started on antidepressants was discussed with the patient and he agrees to monitor for SI -- Will check TSH in context of depressive symptoms -- Continue Vistaril 25mg  po tid PRN anxiety  -- Encouraged patient to participate in unit milieu and in scheduled group therapies   -- Short Term Goals: Ability to identify changes in lifestyle to reduce recurrence of condition will improve and Ability to demonstrate self-control will improve  -- Long Term Goals: Improvement in symptoms so as ready for discharge  3. Medical Issues Being Addressed:   Constipation  -- Continue Colace 100mg  bid PRN constipation  -- Continue Miralax 17g daily PRN constipation  -- Continue MOM PRN   GERD  -- Continue Pepcid 20mg  bid  -- Zofran 8mg  po q8 hours PRN nausea  -- Continue Maalox PRN    HTN  -- Continue to monitor  -- Clonidine 0.1mg  po q8 hours PRN SBP >170 or DBP >110   Thrombocytopenia (platelets 112)  -- will need to see PCP after discharge for monitoring and w/u  -- Appears to have been as low as 61 a year ago    Low alkaline phosphatase (35)  -- will need to see PCP after discharge for monitoring  -- appears baseline - 1 year ago was 28  -- Total bili, AST, and ALT WNL with indirect bili 1.0  4. Discharge Planning:   -- Social work and case management to assist with discharge planning and identification of hospital follow-up needs  prior to discharge  -- Estimated LOS: 5-7 days  -- Discharge Concerns: Need to establish a safety plan; Medication compliance and effectiveness  -- Discharge Goals: Return home with outpatient referrals for mental health follow-up including medication management/psychotherapy  , MD, FAPA 08/16/2020, 5:30 PM

## 2020-08-17 DIAGNOSIS — F332 Major depressive disorder, recurrent severe without psychotic features: Principal | ICD-10-CM

## 2020-08-17 DIAGNOSIS — F419 Anxiety disorder, unspecified: Secondary | ICD-10-CM | POA: Diagnosis present

## 2020-08-17 MED ORDER — FLUOXETINE HCL 20 MG PO CAPS: 20.0000 mg | ORAL_CAPSULE | Freq: Every day | ORAL | 0 refills | Status: DC

## 2020-08-17 MED ORDER — CLONIDINE HCL 0.1 MG PO TABS: 0.1000 mg | ORAL_TABLET | Freq: Three times a day (TID) | ORAL | Status: DC | PRN

## 2020-08-17 MED ORDER — MIRTAZAPINE 15 MG PO TABS: 15.0000 mg | ORAL_TABLET | Freq: Every day | ORAL | 0 refills | Status: DC

## 2020-08-17 NOTE — Progress Notes (Signed)
Recreation Therapy Notes  Animal-Assisted Activity (AAA) Program Checklist/Progress Notes Patient Eligibility Criteria Checklist & Daily Group note for Rec Tx Intervention  Date: 3.15.22 Time: 1430 Location: [address]   AAA/T Program Assumption of Risk Form signed by Engineer, production or Parent Legal Guardian YES   Patient is free of allergies or severe asthma  YES   Patient reports no fear of animals YES   Patient reports no history of cruelty to animals YES  Patient understands his/her participation is voluntary YES   Patient washes hands before animal contact  YES  Patient washes hands after animal contact  YES   Behavioral Response: Engaged  Education: Charity fundraiser, Appropriate Animal Interaction   Education Outcome: Acknowledges understanding/In group clarification offered/Needs additional education.   Clinical Observations/Feedback: Pt attended and participated.  Pt talked about his dog at home while sitting on the floor petting Bodi.    Caroll Rancher, LRT/CTRS    Caroll Rancher A 08/17/2020 3:22 PM

## 2020-08-17 NOTE — Progress Notes (Signed)
°  [facility] Adult Case Management Discharge Plan :  Will you be returning to the same living situation after discharge:  Yes,  apartment At discharge, do you have transportation home?: Yes,  via parents Do you have the ability to pay for your medications: Yes,  has employment and private insurance  Release of information consent forms completed and in the chart;  Patient's signature needed at discharge.  Patient to Follow up at:  Follow-up Information    [facility]AP[facility] [place] Follow up on 08/19/2020.   Specialty: Behavioral Health Why: You have an appointment for therapy services on 08/19/20 at 1:15 pm with [facility].  This will be a Virtual appointment. Contact information: [address] [phone] [place] [address] [phone]       [facility] Go on 09/10/2020.   Why: You have an appointment for medication management services on 09/10/20 at 3:00 pm.  This appointment will be held in person.  Contact information: [address] [place] [address] [phone]               Next level of care provider has access to [facility] Link:yes  Safety Planning and Suicide Prevention discussed: Yes,  friend     Has patient been referred to the Quitline?: N/A patient is not a smoker  Patient has been referred for addiction treatment: N/A  Felizardo Hoffmann, Theresia Majors 08/17/2020, 10:40 AM

## 2020-08-17 NOTE — Discharge Summary (Signed)
Physician Discharge Summary Note  Patient:  Johnny Delgado is an 25 y.o., male MRN:  [id number] DOB:  07/16/1995 Patient phone:  [phone] (home)  Patient address:   [address] [place] [address]  [place] [id number],  Total Time spent with patient: 30 minutes  Date of Admission:  08/13/2020 Date of Discharge: 08/17/2020  Reason for Admission: (From MD's admission note): Johnny Delgado 25 year old Caucasian male presents after intentional overdose.  [name] stated " I have learned my lesson the hard way."  Reports feeling stressed and overwhelmed for the past 3 to 5 months.  States he was followed by mental health in the past however, due to financial restraints has been unable to keep follow-up appointments. Stated he was prescribed Prozac, trazodone and hydroxyzine however denied that he has been taking medications consistently.  Reported previous inpatient admissions. "  Overnight hospitalization"  chart reviewed patient has a history of major depressive disorder, attention deficit disorder and anxiety.  Reports using marijuana occasionally.  Charted history with self injures behavior ie cutting.  denied any illicit drug use.  Reports a family history of mental illness.:  Mother: Depression and anxiety, Father: Depression.  Discussed restarting Prozac and trazodone.    [facility] and [facility].Patient was receptive to plan.Support, encouragement and  reassurance was provided.  [facility] consult note on 08/12/2020: Johnny Delgado is a 25 y.o. male patient admitted with intentional overdose by suicide attempt. Patient reports long standing history of depression, that has worsened over the past few months. He states since October of 2021 he has struggled with depression 2/t finances, self dislike, and job problems. He states during this time he was seen by his PCP who prescribed fluoxetine and  hydroxyzine for him. He states he began to feel better with the medications however it became to expensive and he stopped taking them. He states on Thursday he made an attempt to talk to his family and friends, however continued to feel bad. On Tuesday of this week he became despondent, disconnected and only cared about himself. He states at that time he took all the pills that he could find. His memory and recollection of the events are quite vague, although he reports taking "quetapine, los of Ibuprofen, I mean lots of ibuprofen, some allergy medicine, some antidepressant and anti-anxiety medications. " He reports very few stressors besides finances, bills, and job dislikes. He states he had 3 previous suicide attempts, further clarification sought appears there more suicidal ideations and thoughts. " I attempted to cut my wrist but stopped, and and tried to walk into traffic on [address] but I stopped. " He [facility] for suicidal ideations. He denies any current outpatient providers.  He denies illicit substance use but reports use of delta 8, about 1-2 x a week to help him calm down.  He currently denies suicidal ideations at the time of this evaluation and is able to contract for safety.   HPI: 25 year old male with stable anxiety, depression, ADHD admitted on 3/8 due to concern of overdose after EMS activated by her roommate with report of possible150-200 (500mg ) tylenol tablets taken, 10-15 fluoxetine, 10-15 hydroxyzine. He was witnessed by EMS to have emesis with pill fragments in it. The patient was drowsy in the ER and intubated for airway protection. He was given activated charcoal in the ER which resulted in immediate emesis.Patient was admitted to ICU.Patient was  monitored closely for polysubstance overdose/major depression with ingestion of Prozac, hydroxyzine, Tylenol UDS positive for THC, EtOH negative, managed with NAC protocol, bicarb  drip. Patient LFTs INR Tylenol level overall is stable and extubated 3/9 and transferred to [facility] service 3/10  Evaluation on the unit, day of discharge: Patient was seen, chart reviewed and case discussed with the treatment team. He stated "I feel better today."  He does still appear to be depressed. Patient denies SI, plan or intent. He denies HI/AVH, paranoia and delusions. He reported he is sleeping well and his appetite is good. He has been taking his medications as prescribed. He has responded well to his treatment regimen and feels the medications have been helpful, however he is still endorsing som depression.  He has been attending group therapy and is visible on the unit. He interacts appropriately with staff and peers. He has a follow up virtual appointment at [facility] Therapy services on 3/17 at 1:15 pm. He will see [facility] on 09/10/20 for medication management. Patient is stable for discharge home today.    Principal Problem: Severe recurrent major depression without psychotic features [facility]) Discharge Diagnoses: Principal Problem:   Severe recurrent major depression without psychotic features (HCC) Active Problems:   ADHD (attention deficit hyperactivity disorder), inattentive type   Suicide attempt (HCC)   Anxiety disorder, unspecified   Past Psychiatric History: See H&P  Past Medical History:  Past Medical History:  Diagnosis Date  . ADHD (attention deficit hyperactivity disorder)   . Anxiety   . Depression     Past Surgical History:  Procedure Laterality Date  . WISDOM TOOTH EXTRACTION Bilateral 2015   All 4 extracted   Family History:  Family History  Problem Relation Age of Onset  . Depression Mother   . Drug abuse Mother   . Depression Father   . Drug abuse Father   . ADD / ADHD Brother   . Drug abuse Paternal Grandmother    Family Psychiatric  History: See H & P Social History:  Social History   Substance and Sexual Activity  Alcohol  Use No     Social History   Substance and Sexual Activity  Drug Use No    Social History   Socioeconomic History  . Marital status: Single    Spouse name: Not on file  . Number of children: Not on file  . Years of education: Not on file  . Highest education level: Not on file  Occupational History  . Not on file  Tobacco Use  . Smoking status: Never Smoker  . Smokeless tobacco: Never Used  Vaping Use  . Vaping Use: Some days  Substance and Sexual Activity  . Alcohol use: No  . Drug use: No  . Sexual activity: Never  Other Topics Concern  . Not on file  Social History Narrative  . Not on file   Social Determinants of Health   Financial Resource Strain: Not on file  Food Insecurity: Not on file  Transportation Needs: Not on file  Physical Activity: Not on file  Stress: Not on file  Social Connections: Not on file    Hospital Course:  After the above admission evaluation, Lynne's presenting symptoms were noted. He was recommended for mood stabilization treatments. The medication regimen targeting those presenting symptoms were discussed with him & initiated with his consent. His UDS on arrival to the ED was positive for THC, alcohol level was negative.  He was however medicated, stabilized & discharged  on the medications as listed on his discharge medication lists below. Besides the mood stabilization treatments, Blaike was also enrolled & participated in the group counseling sessions being offered & held on this unit. 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 He presented no other significant pre-existing medical issues that required treatment. He tolerated his treatment regimen without any adverse effects or reactions reported.   During the course of his hospitalization, the 15-minute checks were adequate to ensure patient's safety. Bradshaw did not display any dangerous, violent or suicidal behavior on the unit.  He interacted with patients & staff  appropriately, participated appropriately in the group sessions/therapies. His medications were addressed & adjusted to meet his/her needs. He was recommended for outpatient follow-up care & medication management upon discharge to assure continuity of care & mood stability.  At the time of discharge patient is not reporting any acute suicidal/homicidal ideations. He feels more confident about his/her self-care & in managing his mental health. He currently denies any new issues or concerns. Education and supportive counseling provided throughout his/her hospital stay & upon discharge.   Today upon his discharge evaluation with the attending psychiatrist, [place] shares he is doing better than when he was admitted. He denies any other specific concerns. He is sleeping well. His appetite is good. He denies other physical complaints. He denies AH/VH, delusional thoughts or paranoia. He does not appear to be responding to any internal stimuli. He feels that his medications have been helpful & is in agreement to continue his current treatment regimen as recommended. He was able to engage in safety planning including plan to return to [facility] or contact emergency services if he feels unable to maintain his own safety or the safety of others. Pt had no further questions, comments, or concerns. He left [facility] with all personal belongings in no apparent distress. Transportation per his parents.   Physical Findings: AIMS: Facial and Oral Movements Muscles of Facial Expression: None, normal Lips and Perioral Area: None, normal Jaw: None, normal Tongue: None, normal,Extremity Movements Upper (arms, wrists, hands, fingers): None, normal Lower (legs, knees, ankles, toes): None, normal, Trunk Movements Neck, shoulders, hips: None, normal, Overall Severity Severity of abnormal movements (highest score from questions above): None, normal Incapacitation due to abnormal movements: None, normal Patient's awareness of abnormal  movements (rate only patient's report): No Awareness, Dental Status Current problems with teeth and/or dentures?: No Does patient usually wear dentures?: No  CIWA:    COWS:     Musculoskeletal: Strength & Muscle Tone: within normal limits Gait & Station: normal Patient leans: N/A   Psychiatric Specialty Exam:  Presentation  General Appearance: Fairly Groomed; Appropriate for Environment  Eye Contact:Fair  Speech:Normal Rate  Speech Volume:Normal  Handedness:Right  Mood and Affect  Mood:Depressed  Affect:Appropriate; Congruent  Thought Process  Thought Processes:Coherent  Descriptions of Associations:Intact  Orientation:Full (Time, Place and Person)  Thought Content:Logical; WDL  History of Schizophrenia/Schizoaffective disorder:No data recorded Duration of Psychotic Symptoms:No data recorded Hallucinations:Hallucinations: None  Ideas of Reference:None  Suicidal Thoughts:Suicidal Thoughts: No  Homicidal Thoughts:Homicidal Thoughts: No  Sensorium  Memory:Immediate Good; Recent Good; Remote Good  Judgment:Intact  Insight: Executive Functions  Concentration:Good  Attention Span:Good  Recall:Good  Fund of Knowledge:Good  Language:Good  Psychomotor Activity  Psychomotor Activity:Psychomotor Activity: Normal  Assets  Assets:Communication Skills; Desire for Improvement; Financial Resources/Insurance; Housing; Social Support; Resilience; Talents/Skills; Vocational/Educational  Sleep  Sleep:Sleep: Good  Physical Exam: Physical Exam Constitutional:      Appearance: Normal appearance.  HENT:     Head: Normocephalic.  Pulmonary:     Effort: Pulmonary effort is normal.  Musculoskeletal:        General: Normal range of motion.     Cervical back: Normal range of motion.  Neurological:     Mental Status: He is alert and oriented to person, place, and time.  Psychiatric:        Attention and Perception: Attention normal.        Mood and Affect:  Mood is anxious.        Speech: Speech normal.        Behavior: Behavior normal. Behavior is cooperative.        Thought Content: Thought content does not include suicidal ideation. Thought content does not include suicidal plan.    Review of Systems  Constitutional: Negative.   Respiratory: Negative for cough, shortness of breath and wheezing.   Cardiovascular: Negative.   Gastrointestinal: Negative for constipation and heartburn.  Genitourinary: Negative.   Musculoskeletal: Negative.   Neurological: Negative.    Blood pressure (!) 120/96, pulse (!) 128, temperature 97.8 F (36.6 C), temperature source Oral, resp. rate 16, height 5\' 10"  (1.778 m), weight 93.9 kg, SpO2 100 %. Body mass index is 29.7 kg/m.  Has this patient used any form of tobacco in the last 30 days? (Cigarettes, Smokeless Tobacco, Cigars, and/or Pipes) Yes, N/A  Blood Alcohol level:  Lab Results  Component Value Date   ETH <10 08/10/2020   ETH <10 09/28/2017    Metabolic Disorder Labs:  Lab Results  Component Value Date   HGBA1C 5.7 (H) 07/21/2017   MPG 116.89 07/21/2017   No results found for: PROLACTIN Lab Results  Component Value Date   CHOL 172 07/21/2017   TRIG 132 07/21/2017   HDL 32 (L) 07/21/2017   CHOLHDL 5.4 07/21/2017   VLDL 26 07/21/2017   LDLCALC 114 (H) 07/21/2017    See Psychiatric Specialty Exam and Suicide Risk Assessment completed by Attending Physician prior to discharge.  Discharge destination:  Home  Is patient on multiple antipsychotic therapies at discharge:  No   Has Patient had three or more failed trials of antipsychotic monotherapy by history:  No  Recommended Plan for Multiple Antipsychotic Therapies: NA  Discharge Instructions    Diet - low sodium heart healthy   Complete by: As directed    Increase activity slowly   Complete by: As directed      Allergies as of 08/17/2020      Reactions   Banana Diarrhea      Medication List    STOP taking these  medications   hydrALAZINE 25 MG tablet Commonly known as: APRESOLINE   ibuprofen 200 MG tablet Commonly known as: ADVIL     TAKE these medications     Indication  FLUoxetine 20 MG capsule Commonly known as: PROZAC Take 1 capsule (20 mg total) by mouth daily. Start taking on: August 18, 2020  Indication: Depression   mirtazapine 15 MG tablet Commonly known as: REMERON Take 1 tablet (15 mg total) by mouth at bedtime.  Indication: Major Depressive Disorder       Follow-up Information    BEHAVIORAL HEALTH OUTPATIENT THERAPY [place] Follow up on 08/19/2020.   Specialty: Behavioral Health Why: You have an appointment for therapy services on 08/19/20 at 1:15 pm with [facility].  This will be a Virtual appointment. Contact information: [address] [employer] [place] [phone]  [facility] Go on 09/10/2020.   Why: You have an appointment for medication management services on 09/10/20 at 3:00 pm.  This appointment will be held in person.  Contact information: [address] [place] [address] [phone]               Follow-up recommendations:  Activity:  as tolerated Diet:  Heart healthy  Comments: Prescriptions given at discharge.  Patient agreeable to plan.  Patient was given opportunity to ask questions.  He appears to feel comfortable with discharge and denies any current suicidal or homicidal thoughts, plan or intent.    Patient is instructed prior to discharge to: Take all medications as prescribed by his mental healthcare provider. Report any adverse effects and or reactions from the medicines to his outpatient provider promptly. Patient has been instructed & cautioned: To not engage in alcohol and or illegal drug use while on prescription medicines. In the event of worsening symptoms, patient is instructed to call the crisis hotline, 911 and or go to the nearest ED for appropriate evaluation and  treatment of symptoms. To follow-up with his primary care provider for your other medical issues, concerns and or health care needs.   Signed: Laveda Abbe, NP 08/17/2020, 12:57 PM

## 2020-08-17 NOTE — Progress Notes (Signed)
Discharge Note:  Patient denies SI/HI AVH at this time. Discharge instructions, AVS, prescriptions and transition record gone over with patient. Patient agrees to comply with medication management, follow-up visit, and outpatient therapy. Patient belongings returned to patient. Patient questions and concerns addressed and answered.  Patient ambulatory off unit.  Patient discharged to home with friend.   

## 2020-08-17 NOTE — Progress Notes (Signed)
Adult Psychoeducational Group Note  Date:  08/17/2020 Time:  12:50 PM  Group Topic/Focus:  Goals Group:   The focus of this group is to help patients establish daily goals to achieve during treatment and discuss how the patient can incorporate goal setting into their daily lives to aide in recovery.  Participation Level:  Active  Participation Quality:  Appropriate  Affect:  Appropriate  Cognitive:  Alert and Appropriate  Insight: Appropriate  Engagement in Group:  Engaged  Modes of Intervention:  Discussion  Additional Comments:  Pt attended group and participated in discussion.  Johnny Delgado 08/17/2020, 12:50 PM

## 2020-08-17 NOTE — [facility] Suicide Risk Assessment (Signed)
[facility] INPATIENT:  Family/Significant Other Suicide Prevention Education  Suicide Prevention Education:  Education Completed; Jorene Minors Stephens(friend) [phone],  (name of family member/significant other) has been identified by the patient as the family member/significant other with whom the patient will be residing, and identified as the person(s) who will aid the patient in the event of a mental health crisis (suicidal ideations/suicide attempt).  With written consent from the patient, the family member/significant other has been provided the following suicide prevention education, prior to the and/or following the discharge of the patient.  The suicide prevention education provided includes the following:  Suicide risk factors  Suicide prevention and interventions  National Suicide Hotline telephone number  [facility] assessment telephone number  [facility] Emergency Assistance 911  [facility] and/or Residential Mobile Crisis Unit telephone number  Request made of family/significant other to:  Remove weapons (e.g., guns, rifles, knives), all items previously/currently identified as safety concern.    Remove drugs/medications (over-the-counter, prescriptions, illicit drugs), all items previously/currently identified as a safety concern.  The family member/significant other verbalizes understanding of the suicide prevention education information provided.  The family member/significant other agrees to remove the items of safety concern listed above.  When asked what he felt like led pt to the hospital, "I think between between him being stressed over a girl and work he just got overwhelmed and wasn't aware. He may have felt alone. He just got new promotion at work and he works second shift." No safety concerns. No weapons in the home. Will notify pt's roommate to secure medications in a lock box.   Felizardo Hoffmann 08/17/2020, 9:26 AM

## 2020-08-17 NOTE — [facility] Suicide Risk Assessment (Signed)
[facility] Discharge Suicide Risk Assessment   Principal Problem: Severe recurrent major depression without psychotic features [facility]) Discharge Diagnoses: Principal Problem:   Severe recurrent major depression without psychotic features (HCC) Active Problems:   ADHD (attention deficit hyperactivity disorder), inattentive type   Suicide attempt (HCC)   Anxiety disorder, unspecified   Total Time spent with patient: 20 minutes  Musculoskeletal: Strength & Muscle Tone: within normal limits Gait & Station: normal Patient leans: N/A  Psychiatric Specialty Exam: Review of Systems  All other systems reviewed and are negative.   Blood pressure (!) 120/96, pulse (!) 128, temperature 97.8 F (36.6 C), temperature source Oral, resp. rate 16, height 5\' 10"  (1.778 m), weight 93.9 kg, SpO2 100 %.Body mass index is 29.7 kg/m.  General Appearance: Casual  Eye Contact::  Fair  Speech:  Normal Rate409  Volume:  Normal  Mood:  Anxious  Affect:  Congruent  Thought Process:  Coherent and Descriptions of Associations: Intact  Orientation:  Full (Time, Place, and Person)  Thought Content:  Logical  Suicidal Thoughts:  No  Homicidal Thoughts:  No  Memory:  Immediate;   Good Recent;   Good Remote;   Good  Judgement:  Intact  Insight:  Fair  Psychomotor Activity:  Normal  Concentration:  Good  Recall:  Good  Fund of Knowledge:Good  Language: Good  Akathisia:  Negative  Handed:  Right  AIMS (if indicated):     Assets:  Desire for Improvement Housing Resilience Social Support  Sleep:  Number of Hours: 6.75  Cognition: WNL  ADL's:  Intact   Mental Status Per Nursing Assessment::   On Admission:  Self-harm thoughts  Demographic Factors:  Male and Caucasian  Loss Factors: NA  Historical Factors: Impulsivity  Risk Reduction Factors:   Sense of responsibility to family, Living with another person, especially a relative and Positive social support  Continued Clinical Symptoms:   Depression:   Impulsivity  Cognitive Features That Contribute To Risk:  None    Suicide Risk:  Minimal: No identifiable suicidal ideation.  Patients presenting with no risk factors but with morbid ruminations; may be classified as minimal risk based on the severity of the depressive symptoms   Follow-up Information    BEHAVIORAL HEALTH PARTIAL HOSPITALIZATION PROGRAM Follow up.   Specialty: [facility] information: [address] [place] [address] [employer] [place] [phone]              Plan Of Care/Follow-up recommendations:  Activity:  ad lib  [phone], MD 08/17/2020, 9:00 AM

## 2020-08-19 ENCOUNTER — Other Ambulatory Visit: Payer: Self-pay

## 2020-08-19 ENCOUNTER — Ambulatory Visit (INDEPENDENT_AMBULATORY_CARE_PROVIDER_SITE_OTHER): Payer: BC Managed Care – PPO | Admitting: Licensed Clinical Social Worker

## 2020-08-19 DIAGNOSIS — F902 Attention-deficit hyperactivity disorder, combined type: Secondary | ICD-10-CM | POA: Diagnosis not present

## 2020-08-19 DIAGNOSIS — F411 Generalized anxiety disorder: Secondary | ICD-10-CM

## 2020-08-19 DIAGNOSIS — F332 Major depressive disorder, recurrent severe without psychotic features: Secondary | ICD-10-CM

## 2020-08-19 NOTE — Progress Notes (Signed)
Comprehensive Clinical Assessment (CCA) Note  08/19/2020 Johnny Delgado [id number]  Visit Diagnosis:        ICD-10-CM    1. Major Depressive Disorder, Recurrent, Severe F33.2    2. Generalized Anxiety Disorder F41.1    3. ADHD, combined type   F90.2     CCA Part One   Part One has been completed on paper by the patient.  (See scanned document in Chart Review).  CCA Biopsychosocial Intake/Chief Complaint:  Johnny Delgado, who prefers to go by Johnny Delgado" stated "Johnny need to work on my self-worth. Johnny tried to commit suicide on the 8th by taking some expired medications Johnny had at home, and they kept me for several days.  Johnny attended some groups and worked on my coping skills, but Johnny need more help".  Current Symptoms/Problems: Johnny Delgado was admitted to [facility] on 08/10/20 following an attempted suicide attempt via overdose on Tylenol, fluoxetine, and hydroxyzine.  Johnny Delgado reported that his roommate intervened by calling EMS and he was subsequently admitted to psychiatry ward for safe monitoring.  Johnny Delgado reported history of depression, and anxiety since age 38.  He reported one other suicide attempt on 07/19/17 when he walked onto [facility] and sat in traffic hoping he would be hit, which led to IVC, and admission into PHP group therapy after discharge, which was a positive experience, although he did not follow up with individual therapy thereafter and has had inconsistent follow through with psychiatrists. Johnny Delgado reported that he has also engaged in self-harm of cutting his wrists for a period of 2-3 months around 2019, but has not engaged in this behavior since then.  He reported that he was receiving behavioral medication for awhile through PCP, but could no longer afford it and stopped months ago, which could be a significant factor in inability to cope with recent stressors at work over past 3-5 months.  Johnny Delgado endorsed symptoms of ADHD which make it difficult for him to focus, in addition to several traits suggestive  of borderline personality disorder, although further assessment is needed to support this.  Johnny Delgado reported recreational marijuana use x2 per month on average, but denied any negative consequences resulting from present pattern of use.  Johnny Delgado completed PHQ9 and GAD7 screenings today, scoring a 23 and 16 respectively.  He completed updated CSSRS, showing no risk of self-harm at this time.  Johnny Delgado completed pain and nutritional screenings, denying any physical pain, but noting that he has had less of an appetite due to depression, but has been forcing himself to eat x3 per day.   Patient Reported Schizophrenia/Schizoaffective Diagnosis in Past: No   Strengths: Johnny Delgado reported that he stays close to friends and family in support system to avoid isolation, uses music for relaxation and has good some hobbies, is gainfully employed as Barrister's clerk, and has stable housing with a roommate that is handling is medications following recent suicide attempt.  Preferences: Johnny Delgado stated "My biggest concern is the financial part of it, being able to afford it".  He reported that he would like to begin meeting biweekly for therapy, since he believes this will work with his budget.  Abilities: Intelligent, motivated for treatment, good support network.   Type of Services Patient Feels are Needed: Individual therapy with medication managment through PCP.   Initial Clinical Notes/Concerns: Johnny Delgado, who prefers to go by "Johnny Delgado" is a 25 y.o. single Caucasian male that presented for in-person clinical assessment today and was alert, oriented x5 with no evidence or  self-report of active SI/HI or A/V H. He reported that he has remained compliant with medications since discharge from hospital and intends to link with a psychiatrist once he speaks with his parents about establishing a budget to cover mental health costs.  [name] reported that he last smoked marijuana on 08/07/20 with friends and will be closely monitored  during treatment to ensure this doesn't become an impediment to goal progress. He denied any other substance abuse patterns.  Johnny Delgado reported that he has an active safety plan in place following discharge from behavioral health hospital, including coping skills he can use to distract himself, supports he can outreach, suicide hotline numbers, and his roommate is handling his medications at this time to avoid another overdose event.  Johnny Delgado denied any SI/HI or A/V H today in session, and is agreeable to voluntarily checking himself into the hospital again for safety should plan/intent to harm himself and/or others arises.  Johnny Delgado would be appropriate for engagement in PHP group therapy again following recent suicide attempt, as this would offer closer monitoring and more intensive treatment, but he is not interested at this time, and does not feel like he could afford the financial cost or take time off from work.  He has been encouraged to take advantage of free community support groups available at agencies such as [facility] in an effort to bridge this gap in care, given his scheduling/financial limitations.   Mental Health Symptoms Depression:  Change in energy/activity; Difficulty Concentrating; Fatigue; Hopelessness; Increase/decrease in appetite; Irritability; Sleep (too much or little); Worthlessness; Tearfulness (Johnny Delgado reported current episode has lasted 2-3 months now, previous episodes in past.)   Duration of Depressive symptoms: Greater than two weeks   Mania:  Increased Energy; Racing thoughts; Recklessness (Johnny Delgado reported that he has experienced periods of elevated energy and can act recklessly, lasting roughly 1-2 hours. He reported that these have been occurring 3-4 years now, but believes this has more to do with ADHD than bipolar disorder.)   Anxiety:   Difficulty concentrating; Fatigue; Irritability; Restlessness; Sleep; Worrying; Tension (Johnny Delgado reported that he has struggled  with anxiety much of his life and has racing thoughts daily.)   Psychosis:  None   Duration of Psychotic symptoms: No data recorded  Trauma:  Avoids reminders of event; Guilt/shame; Irritability/anger (Johnny Delgado reported that being taken to the ER for suicide attempt was traumatic, as well as finding grandmother on floor and having to clean up blood after an accident she experienced 12/17.)   Obsessions:  None   Compulsions:  None   Inattention:  Avoids/dislikes activities that require focus; Disorganized; Does not follow instructions (not oppositional); Does not seem to listen; Fails to pay attention/makes careless mistakes; Forgetful; Loses things; Poor follow-through on tasks; Symptoms present in 2 or more settings (Johnny Delgado reported that he has struggled with ADHD symptoms since at least 2017 and is unmedicated for these.)   Hyperactivity/Impulsivity:  Always on the go; Blurts out answers; Difficulty waiting turn; Feeling of restlessness; Fidgets with hands/feet; Several symptoms present in 2 of more settings (Johnny Delgado reported that he has struggled with ADHD symptoms since at least 2017 and is unmedicated for these.)   Oppositional/Defiant Behaviors:  Angry (Johnny Delgado reported that he feels anger towards himself, and when he makes a mistake, he takes it out on himself and can be critical.)   Emotional Irregularity:  Recurrent suicidal behaviors/gestures/threats; Chronic feelings of emptiness; Potentially harmful impulsivity; Intense/unstable relationships; Unstable self-image (Johnny Delgado reported some borderline traits, including impulsiveness towards  self-harm, feelings of emptiness, self-image.  He reported that he believes a past psychiatrist diagnosed him with this personality disorder, but further assessment will be needed.)   Other Mood/Personality Symptoms:  No data recorded   Risk Assessment- Self-Harm Potential: Risk Assessment For Self-Harm Potential Thoughts of Self-Harm: No current  thoughts Method: No plan Availability of Means: No access/NA Additional Comments for Self-Harm Potential: Johnny Delgado denied experiencing any SI today, and is agreeable to seeking hospitalization again should SI with intent and plan return.    Risk Assessment -Dangerous to Others Potential: Risk Assessment For Dangerous to Others Potential Method: No Plan Availability of Means: No access or NA Intent:  NA Notification Required: No need or identified person  Mental Status Exam Appearance and self-care  Stature:  Average (5'10)   Weight:  Average weight (Self-reported 207lbs.)   Clothing:  Casual   Grooming:  Normal   Cosmetic use:  None   Posture/gait:  Normal   Motor activity:  Restless   Sensorium  Attention:  Normal   Concentration:  Normal   Orientation:  X5   Recall/memory:  Defective in Remote (Johnny Delgado reported its hard to remember much of his highschool experience due to bullying and may have repressed this.)   Affect and Mood  Affect:  Anxious; Depressed   Mood:  Anxious; Depressed   Relating  Eye contact:  Normal   Facial expression:  Anxious   Attitude toward examiner:  Cooperative   Thought and Language  Speech flow: Normal   Thought content:  Appropriate to Mood and Circumstances   Preoccupation:  None   Hallucinations:  None   Organization:  No data recorded  Affiliated Computer ServicesExecutive Functions  Fund of Knowledge:  Average   Intelligence:  Average   Abstraction:  Normal   Judgement:  Fair   Reality Testing:  Adequate   Insight:  Fair   Decision Making:  Impulsive   Social Functioning  Social Maturity:  Impulsive   Social Judgement:  Normal   Stress  Stressors:  Family conflict; Work; Surveyor, quantityinancial (Reported 'Touchy' relationship with father, finances are tight, work is stressful.)   Coping Ability:  Human resources officerverwhelmed   Skill Deficits:  Special educational needs teacherCommunication; Self-care; Self-control   Supports:  Family; Friends/Service system      Religion: Religion/Spirituality Are You A Religious Person?: No ("Johnny'm struggled with that right now".) What is Your Religious Affiliation?: Christian  Leisure/Recreation: Leisure / Recreation Do You Have Hobbies?: Yes Leisure and Hobbies: Johnny Delgado reported that he draws, plays video games, enjoys music, and being with friends  Exercise/Diet: Exercise/Diet Do You Exercise?: No Have You Gained or Lost A Significant Amount of Weight in the Past Six Months?: No Do You Follow a Special Diet?: No Do You Have Any Trouble Sleeping?: Yes Explanation of Sleeping Difficulties: Johnny Delgado reported that he struggles with falling asleep due to anxious thoughts.  Still getting roughly 8 hours.   CCA Employment/Education Employment/Work Situation: Employment / Work Situation Employment situation: Employed Where is patient currently employed?: [employer] How long has patient been employed?: 15 months Patient's job has been impacted by current illness: Yes Describe how patient's job has been impacted: Johnny Delgado reported that its difficult to focus on tasks. 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.)  Education: Education Is Patient  Currently Attending School?: No Last Grade Completed: 12 Name of High School: [employer] Did You Graduate From [employer]?: Yes Did You Attend College?: Yes (didn't complete due to symptoms; kicked out) What Type of College Degree Do you Have?: No degree, went for 1 year Did You Have An Individualized Education Program (IIEP): No Did You Have Any Difficulty At School?: Yes (Johnny Delgado reported that he found it difficult to focus on work, and was bullied as well.) Were Any Medications Ever Prescribed For These Difficulties?:  No Patient's Education Has Been Impacted by Current Illness: Yes How Does Current Illness Impact Education?: See above, inability to focus on work.   CCA Family/Childhood History Family and Relationship History: Family history Marital status: Single Are you sexually active?: Yes What is your sexual orientation?: Non-binary Has your sexual activity been affected by drugs, alcohol, medication, or emotional stress?: Denied. Does patient have children?: No  Childhood History:  Childhood History By whom was/is the patient raised?: Both parents Description of patient's relationship with caregiver when they were a child: Johnny Delgado reported that relationship with his mother was good, but his father was a Education officer, environmental and "the family was kind of in a fishbowl" and he felt pressure to be perfect. Patient's description of current relationship with people who raised him/her: Johnny Delgado reported that things are still good with mom, with dad still needs to work on repairing relationship with him. How were you disciplined when you got in trouble as a child/adolescent?: Johnny Delgado reported that he was spanked or grounded. Does patient have siblings?: Yes Number of Siblings: 2 Description of patient's current relationship with siblings: One older sister and one younger brother good; talk often to brother, somewhat distant with sister, stating "We're different people, its hard to relate". Did patient suffer any verbal/emotional/physical/sexual abuse as a child?: No Did patient suffer from severe childhood neglect?: No Has patient ever been sexually abused/assaulted/raped as an adolescent or adult?: No Was the patient ever a victim of a crime or a disaster?: No (grandma died) Witnessed domestic violence?: No Has patient been affected by domestic violence as an adult?: No  CCA Substance Use Alcohol/Drug Use: Alcohol / Drug Use Pain Medications: Denied. Prescriptions: Prozac and Remeron Over the Counter: Tylenol and  allergy medicine PRN History of alcohol / drug use?: Yes Negative Consequences of Use:  (Denied any consequences resulting from use of THC.) Substance #1 Name of Substance 1: Cannabis/THC 1 - Age of First Use: 22 1 - Amount (size/oz): Uncertain, stating "Johnny just smoke occasionally with friends". 1 - Frequency: "About x2 per month". 1 - Duration: Since 22 1 - Last Use / Amount: 08/07/20;  "Johnny took a few hits, but couldn't say how much". 1 - Method of Aquiring: "Sometimes Johnny buy Delta8 at vape shops or smoke with friends". 1- Route of Use: Smoking, sometimes orally with edibles rarely.  Recommendations for Services/Supports/Treatments: Recommendations for Services/Supports/Treatments Recommendations For Services/Supports/Treatments: Individual Therapy,Medication Management (Johnny Delgado was recommended to return to [facility] following suicide attempt, but reported that he cannot afford this level of treatment or take time off work.  He has been encouraged to engage in free community support groups weekly to bridge this gap in care.)  DSM5 Diagnoses: Patient Active Problem List   Diagnosis Date Noted  . Anxiety disorder, unspecified 08/17/2020  . Suicide attempt (HCC) 08/13/2020  . Acetaminophen overdose 08/11/2020  . Endotracheally intubated 08/11/2020  . Hypokalemia 08/11/2020  . Acute respiratory failure (HCC)   . Dehydration 07/13/2019  . Gastroenteritis  07/13/2019  . ADHD (attention deficit hyperactivity disorder), inattentive type 07/30/2017  . Severe recurrent major depression without psychotic features (HCC) 07/20/2017    Patient Centered Plan: Meet with clinician once every 2 weeks for individual therapy to monitor progress towards goals and address any barriers to success; Meet with PCP once every 6 months to address any physical health needs that arise; Schedule an initial appointment with a psychiatrist within the next 3 months in order to ensure continuation of current behavioral medication  and discuss options for treatment of ADHD; Take medications daily as prescribed to reduce severity of symptoms and increase daily stability/functioning;  Reduce depression from 4/10 in average severity down to 2/10 in next 90 days by setting aside at least 1-2 hours daily for positive self-care activities; Reduce anxiety from average severity level of 6/10 down to a 4/10 and prevent reoccurrence of panic attacks over next 90 days by practicing 2-3 relaxation/grounding skills daily such as mindful breathing, progressive muscle relaxation, positive visualization 5-4-3-2-1 grounding, etc; Commit to exercising x2 days per week for 1 hour on average to improve physical health and self-image/esteem; Maintain 40 hours weekly employment to ensure productivity and financial security while looking for ways to improve work/life balance in order to alleviate related stress;  Consider engagement in free weekly community mental health support groups within next 3 months in order to expand network of relatable peers and avoid isolation; Increase sense of self-worth from 1/10 up to an average of 4/10 in next 90 days by "Coming up with reasons why Johnny like myself, or be more willing to take compliments to heart"; Voluntarily seek hospitalization should SI/HI appear and safety of self and/or others is determined to be at risk due to development of plan/intent to harm.   Referrals to Alternative Service(s): Referred to Alternative Service(s):   Place:   Date:   Time:    Referred to Alternative Service(s):   Place:   Date:   Time:    Referred to Alternative Service(s):   Place:   Date:   Time:    Referred to Alternative Service(s):   Place:   Date:   Time:     Donna Christen, Darcey Nora 08/19/20

## 2020-09-02 ENCOUNTER — Ambulatory Visit (INDEPENDENT_AMBULATORY_CARE_PROVIDER_SITE_OTHER): Payer: BC Managed Care – PPO | Admitting: Licensed Clinical Social Worker

## 2020-09-02 ENCOUNTER — Other Ambulatory Visit: Payer: Self-pay

## 2020-09-02 DIAGNOSIS — F411 Generalized anxiety disorder: Secondary | ICD-10-CM | POA: Diagnosis not present

## 2020-09-02 DIAGNOSIS — F332 Major depressive disorder, recurrent severe without psychotic features: Secondary | ICD-10-CM

## 2020-09-02 DIAGNOSIS — F902 Attention-deficit hyperactivity disorder, combined type: Secondary | ICD-10-CM | POA: Diagnosis not present

## 2020-09-02 NOTE — Progress Notes (Signed)
THERAPIST PROGRESS NOTE   Session Time: 1:00pm - 2:00pm  Location: Patient: [facility] [place] [facility] Therapist: [facility] [place] [facility]   Participation Level: Active   Behavioral Response: Alert, casually dressed, depressed mood/affect   Type of Therapy: Individual Therapy    Treatment Goals addressed: Coping with depression, anxiety, and panic attacks; Medication compliance; Psychiatry appointment scheduling; Exercise regimen; Maintaining employment    Interventions: CBT: cognitive triangle, external/internal triggers, and positive affirmations    Summary: Johnny Delgado, who prefers to go by "Johnny Delgado" is a 25 year old single Caucasian male that presented today for therapy appointment with diagnosis of Major Depressive Disorder, recurrent, severe; Generalized Anxiety Disorder; and ADHD.      Suicidal/Homicidal: None; without intent or plan.    Therapist Response:  Clinician met with Johnny Delgado for therapy session and assessed for safety, medication compliance, and sobriety.  Johnny Delgado presented for this appointment on time and was alert, oriented x5, with no evidence or self-report of active SI/HI or A/V H.  Johnny Delgado reported ongoing compliance with medication and denied any abuse of alcohol or illicit substances.  Clinician inquired about Johnny Delgado's emotional ratings today, as well as any significant changes in thoughts, feelings or behavior since completion of assessment.  Johnny Delgado reported scores of 3/10 for depression, 3/10 for anxiety, 0/10 for anger/irritability, and denied experiencing any recent panic attacks.  Johnny Delgado reported that over the last week he has stayed busy with his work schedule, but has been still prioritizing 1-2 hours of self-care each day, in addition to keeping up with exercise regimen.  Johnny Delgado reported that one challenge he experienced this week which affected his mood was having his tire go flat when he was distracted and took a turn too fast on his way too work.  Johnny Delgado stated "When I'm driving I'm always  in my head, its easy to get distracted".  Clinician utilized a CBT handout with Johnny Delgado today which outlined the connection between thoughts, feelings, and behavior, including how the presence of internal and/or external triggers can influence one's mood and outlook.  Clinician provided Johnny Delgado with examples of positive affirmations he could utilize when faced with future challenges in order to improve ability to reframe and cope more effectively.  Interventions were effective, as evidenced by Johnny Delgado reporting that this material helped him identify both external (cops, car trouble, lack of support from network) and internal (feeling scared, shocked, angry, lonely, etc) triggers that were present during the flat tire incident, in addition to better understanding how to intervene appropriately next time so that he doesn't feel overwhelmed and have to call out of work.  Johnny Delgado was also able to identify affirmations that would help challenge negative thinking in the future, such as "This too shall pass" and "I learn from my challenges".  Johnny Delgado reported that he would like to begin meeting once per week for therapy instead of biweekly due to benefits gained from this session, and will outreach insurance today about available psychiatrists so that he can ensure continuation on behavioral medications.  Clinician will continue to monitor.     Plan: Meet again in 1 week.   Diagnosis: Major Depressive Disorder, recurrent, severe; Generalized Anxiety Disorder; and ADHD.   Shade Flood, [place], LCAS 09/02/2020

## 2020-09-09 ENCOUNTER — Other Ambulatory Visit: Payer: Self-pay

## 2020-09-09 ENCOUNTER — Ambulatory Visit (INDEPENDENT_AMBULATORY_CARE_PROVIDER_SITE_OTHER): Payer: BC Managed Care – PPO | Admitting: Licensed Clinical Social Worker

## 2020-09-09 DIAGNOSIS — F902 Attention-deficit hyperactivity disorder, combined type: Secondary | ICD-10-CM

## 2020-09-09 DIAGNOSIS — F332 Major depressive disorder, recurrent severe without psychotic features: Secondary | ICD-10-CM | POA: Diagnosis not present

## 2020-09-09 DIAGNOSIS — F411 Generalized anxiety disorder: Secondary | ICD-10-CM

## 2020-09-09 NOTE — Progress Notes (Signed)
THERAPIST PROGRESS NOTE   Session Time: 1:00pm - 2:00pm  Location: Patient: [facility] [place] [facility] Therapist: [facility] [facility]   Participation Level: Active   Behavioral Response: Alert, casually dressed, anxious mood/affect   Type of Therapy: Individual Therapy    Treatment Goals addressed: Coping with depression, anxiety, and panic attacks; Medication compliance; Building self-esteem/confidence   Interventions: CBT: handout on building self-esteem/confidence     Summary: Johnny Delgado, who prefers to go by "Johnny Delgado" is a 25 year old single Caucasian male that presented today for therapy appointment with diagnosis of Major Depressive Disorder, recurrent, severe; Generalized Anxiety Disorder; and ADHD.       Suicidal/Homicidal: None; without intent or plan.    Therapist Response:  Clinician met with Johnny Delgado for in-person therapy appointment and assessed for safety, medication compliance, and sobriety.  Johnny Delgado presented for this session on time and was alert, oriented x5, with no evidence or self-report of active SI/HI or A/V H.  Johnny Delgado reported that he continues taking medication responsibly and denied any abuse of alcohol or illicit substances.  Clinician inquired about Johnny Delgado's current emotional ratings, as well as any significant changes in thoughts, feelings or behavior since last check-in.  Johnny Delgado reported scores of 3/10 for depression, 4/10 for anxiety, 0/10 for anger/irritability, and denied experiencing any recent panic attacks.  Johnny Delgado reported that over the past week he has maintained his normal work schedule, in addition to visiting family over the weekend, stating "It was a good time, we all got along and they are supportive of me being in therapy".  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  Johnny Delgado reported that he wished to work on self-esteem/confidence today, so clinician utilized a worksheet on this topic with  him to guide discussion.  This handout involved first identifying fictional and living role models with favorable strengths that Johnny Delgado looks up to, in addition to listing ways they utilize these strengths to cope with everyday challenges.  Clinician then tasked Johnny Delgado with identifying similar strength he shares with these role models with added assistance from a comprehensive list of common ones (I.e. wisdom, kindness, creativity, social awareness, etc).  Clinician inquired about how Johnny Delgado will utilize his particular strengths in days ahead to work towards making progress on treatment goals.  Intervention was effective, as evidenced by Johnny Delgado identifying two role models he looks up to from comics, and several strengths within himself to continue nurturing in weeks ahead such as intelligence, empathy, bravery, modesty, love of learning, and more.  Johnny Delgado stated "I liked this activity because it really opened my eyes up to things about myself I don't appreciate as much as I should.  I can use drawing as an outlet for stress, keep an open mind to avoid disagreements, and find things to laugh at so things aren't so serious".   Clinician will continue to monitor.      Plan: Meet again in 1 week.   Diagnosis: Major Depressive Disorder, recurrent, severe; Generalized Anxiety Disorder; and ADHD.   Shade Flood, , LCAS 09/09/2020

## 2020-09-13 ENCOUNTER — Other Ambulatory Visit (HOSPITAL_COMMUNITY): Payer: Self-pay | Admitting: Psychiatry

## 2020-09-16 ENCOUNTER — Ambulatory Visit (INDEPENDENT_AMBULATORY_CARE_PROVIDER_SITE_OTHER): Payer: BC Managed Care – PPO | Admitting: Licensed Clinical Social Worker

## 2020-09-16 ENCOUNTER — Other Ambulatory Visit: Payer: Self-pay

## 2020-09-16 DIAGNOSIS — F411 Generalized anxiety disorder: Secondary | ICD-10-CM

## 2020-09-16 DIAGNOSIS — F332 Major depressive disorder, recurrent severe without psychotic features: Secondary | ICD-10-CM

## 2020-09-16 DIAGNOSIS — F902 Attention-deficit hyperactivity disorder, combined type: Secondary | ICD-10-CM

## 2020-09-16 NOTE — Progress Notes (Signed)
THERAPIST PROGRESS NOTE   Session Time: 1:00pm - 2:00pm  Location: Patient: [facility] [place] [facility] Therapist: [facility]   Participation Level: Active   Behavioral Response: Alert, casually dressed, anxious mood/affect   Type of Therapy: Individual Therapy    Treatment Goals addressed: Coping with depression, anxiety, and panic attacks; Medication compliance; Building self-esteem/confidence   Interventions: CBT; mental grounding techniques   Summary: Johnny Delgado, who prefers to go by "[name]" is a 25 year old single Caucasian male that presented today for therapy appointment with diagnosis of Major Depressive Disorder, recurrent, severe; Generalized Anxiety Disorder; and ADHD.       Suicidal/Homicidal: None; without intent or plan.    Therapist Response:  Clinician met with [name] for in-person therapy session and assessed for safety, medication compliance, and sobriety.  [name] presented for this appointment on time and was alert, oriented x5, with no evidence or self-report of active SI/HI or A/V H.  [name] reported ongoing compliance with medication and denied any abuse of alcohol or illicit substances.  Clinician inquired about [name]'s emotional ratings today, as well as any significant changes in thoughts, feelings or behavior since previous check-in.  [name] reported scores of 5/10 for depression, 6/10 for anxiety, 0/10 for anger/irritability.  [name] reported that since the last session, he ended up helping out a friend that was in crisis, and they reaffirmed some of his positive traits such a reliability, support, and security, which in turn boosted his comfidence.  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  It was like 20 minutes and I felt like my mind was racing but I couldn't concentrate".  [name] reported that he  listened to music to ground himself, but didn't have any other skills he could use to intervene during this event.  Clinician discussed mental grounding techniques with [name] today which he could utilize to temporarily distract himself from distressing thoughts and feelings when experiencing these panic symptoms.  Some examples included focusing on sensory details of his environment, playing a categories game with himself, using humor, or counting to 10.  Interventions were effective, as evidenced by [name] trying several of these in session today with success, and stating "I think they seem very helpful, and I plan to practice them in case I feel like that way again".  Clinician will continue to monitor.      Plan: Meet again in 1 week.   Diagnosis: Major Depressive Disorder, recurrent, severe; Generalized Anxiety Disorder; and ADHD.   Shade Flood, LCSW, LCAS 09/16/2020

## 2020-09-23 ENCOUNTER — Other Ambulatory Visit: Payer: Self-pay

## 2020-09-23 ENCOUNTER — Ambulatory Visit (INDEPENDENT_AMBULATORY_CARE_PROVIDER_SITE_OTHER): Payer: BC Managed Care – PPO | Admitting: Licensed Clinical Social Worker

## 2020-09-23 DIAGNOSIS — F902 Attention-deficit hyperactivity disorder, combined type: Secondary | ICD-10-CM

## 2020-09-23 DIAGNOSIS — F411 Generalized anxiety disorder: Secondary | ICD-10-CM

## 2020-09-23 DIAGNOSIS — F332 Major depressive disorder, recurrent severe without psychotic features: Secondary | ICD-10-CM

## 2020-09-23 NOTE — Progress Notes (Signed)
Virtual Visit via Video Note   I connected with Johnny Delgado, who prefers to go by "Johnny Delgado" on 09/23/20 at 1:00pm by video enabled telemedicine application and verified that I am speaking with the correct person using two identifiers.   I discussed the limitations, risks, security and privacy concerns of performing an evaluation and management service by video and the availability of in person appointments. I also discussed with the patient that there may be a patient responsible charge related to this service. The patient expressed understanding and agreed to proceed.   I discussed the assessment and treatment plan with the patient. The patient was provided an opportunity to ask questions and all were answered. The patient agreed with the plan and demonstrated an understanding of the instructions.   The patient was advised to call back or seek an in-person evaluation if the symptoms worsen or if the condition fails to improve as anticipated.   I provided 1 hour of non-face-to-face time during this encounter.   Shade Flood, LCSW, LCAS ________________________ THERAPIST PROGRESS NOTE   Session Time: 1:00pm - 2:00pm  Location: Patient: Patient Home Provider: [facility] [facility]   Participation Level: Active   Behavioral Response: Alert, casually dressed, depressed mood/affect   Type of Therapy: Individual Therapy    Treatment Goals addressed: Coping with depression, anxiety, and panic attacks; Medication compliance   Interventions: CBT; communication skills   Summary: Johnny Delgado, who prefers to go by "Johnny Delgado" is a 25 year old single Caucasian male that presented today for therapy appointment with diagnosis of Major Depressive Disorder, recurrent, severe; Generalized Anxiety Disorder; and ADHD.       Suicidal/Homicidal: None; without intent or plan.    Therapist Response:  Clinician met with Johnny Delgado for virtual therapy appointment and assessed for safety, medication compliance, and  sobriety.  Johnny Delgado presented for this session on time and was alert, oriented x5, with no evidence or self-report of active SI/HI or A/V H.  Johnny Delgado reported that he continues taking medication as prescribed and denied any abuse of alcohol or illicit substances.  Clinician inquired about Johnny Delgado's current emotional ratings, as well as any significant changes in thoughts, feelings or behavior since last check-in.  Johnny Delgado reported scores of 5/10 for depression, 5/10 for anxiety, 0/10 for anger/irritability.  He denied any panic attacks. Johnny Delgado reported that one recent challenge was getting into an argument with his mother about how he is spending his money lately.  Johnny Delgado reported that when he is paid each week, he meets with one of his parents to prioritize bills that need to be paid on an Excel sheet, and when he disclosed roughly $100 spent towards things that weren't necessary, she became upset.  Johnny Delgado stated "She came off as very aggressive and it felt like she was berating me.  I understood where she was coming from, but it still hurt".  Clinician discussed common problematic communication styles that can lead to conflict (i.e. passive, aggressive, passive-aggressive) in families, as well as traits of each category and tips for assertively addressing recent communication issues with parent in order to increase understanding and support between them.  Johnny Delgado acknowledged that he can be very passive and conflict like this can lead him to shut down and be less honest, so he will pick an appropriate time this week to revisit conversation with his mother in a calm manner, express feelings using "I" statements, and seek mutual resolution to budgeting issues. He reported that he would take a similar approach  with his father, as they have additional communication issues, including feeling like the father is always right and does not listen to Johnny Delgado's concerns.  Clinician assisted Johnny Delgado in roleplaying assertive communication  skills to prepare for these upcoming conversations in order to increase confidence and chance of successful follow through.  Interventions were effective, as evidenced by Johnny Delgado reporting that this made him feel more self-assured in abilities to resolve family conflict, stating "I think this helped me figure out a script to talk with them both, and I can start being more honest about how they can help me".  Clinician will continue to monitor.     Plan: Meet again in 1 week.   Diagnosis: Major Depressive Disorder, recurrent, severe; Generalized Anxiety Disorder; and ADHD.   Shade Flood, [place], LCAS 09/23/2020

## 2020-09-30 ENCOUNTER — Other Ambulatory Visit: Payer: Self-pay

## 2020-09-30 ENCOUNTER — Ambulatory Visit (HOSPITAL_COMMUNITY): Payer: BC Managed Care – PPO | Admitting: Licensed Clinical Social Worker

## 2020-09-30 ENCOUNTER — Telehealth (HOSPITAL_COMMUNITY): Payer: Self-pay | Admitting: Licensed Clinical Social Worker

## 2020-09-30 NOTE — Telephone Encounter (Signed)
Johnny Delgado had a therapy session scheduled today at 1pm.  Clinician contacted him by phone at 1:05pm when he had not presented on time and received no response.  Clinician left a voicemail reminding Johnny Delgado of this appointment, and left callback numbers for office and front desk staff.  Clinician informed front desk of no-show event when [name] did not show by 1:15pm.   Noralee Stain, LCSW, LCAS 09/30/20

## 2020-10-07 ENCOUNTER — Ambulatory Visit (INDEPENDENT_AMBULATORY_CARE_PROVIDER_SITE_OTHER): Payer: BC Managed Care – PPO | Admitting: Licensed Clinical Social Worker

## 2020-10-07 ENCOUNTER — Other Ambulatory Visit: Payer: Self-pay

## 2020-10-07 DIAGNOSIS — F332 Major depressive disorder, recurrent severe without psychotic features: Secondary | ICD-10-CM

## 2020-10-07 DIAGNOSIS — F411 Generalized anxiety disorder: Secondary | ICD-10-CM

## 2020-10-07 DIAGNOSIS — F902 Attention-deficit hyperactivity disorder, combined type: Secondary | ICD-10-CM

## 2020-10-07 NOTE — Progress Notes (Signed)
THERAPIST PROGRESS NOTE   Session Time: 1:00pm - 2:00pm  Location: Patient: [facility] Provider: [facility]    Participation Level: Active   Behavioral Response: Alert, casually dressed, depressed mood/affect   Type of Therapy: Individual Therapy    Treatment Goals addressed: Coping with depression, anxiety, and panic attacks; Medication compliance   Interventions: CBT, gender affirming therapy, recommendation for family therapy and support groups    Summary: Johnny Delgado, who prefers to go by "Johnny Delgado" is a 25 year old single Caucasian non-binary that presented today for therapy appointment with diagnosis of Major Depressive Disorder, recurrent, severe; Generalized Anxiety Disorder; and ADHD.       Suicidal/Homicidal: None; without intent or plan.    Therapist Response:  Clinician met with Johnny Delgado for in-person therapy session and assessed for safety, medication compliance, and sobriety.  Johnny Delgado presented for this appointment on time and was alert, oriented x5, with no evidence or self-report of active SI/HI or A/V H.  Johnny Delgado reported ongoing compliance with medication and denied any abuse of alcohol or illicit substances.  Clinician inquired about Johnny Delgado's emotional ratings today, as well as any significant changes in thoughts, feelings or behavior since previous check-in.  Johnny Delgado reported scores of 5/10 for depression, 5/10 for anxiety, 0/10 for anger/irritability.  They denied experiencing any panic attacks. Johnny Delgado reported that the past 2 weeks have been 'crazy', as they went to a wedding, and encountered some guests from previous church that would not refer to [place] by their preferred name.  Johnny Delgado also opened up to clinician about identifying as non-binary for first time in this session, and clinician was supportive of this disclosure, and offered to update medical chart demographics in Epic to reflect this if it would make them more comfortable. Johnny Delgado was agreeable to this, so clinician  updated medical chart to reflect gender identity as 'non binary' and preferred pronouns as 'they/them/theirs'.  Clinician provided space in session for processing of this new disclosure and validation of their feelings.  Johnny Delgado was appreciative of this, and opened up further about how their parents have never been accepting of calling them by preferred name or acknowledging their unique gender identity, stating "They are very [place] and don't agree with any of it".  Johnny Delgado reported that they were also unable to have their nametag at work reflect this name change, so it often feels like they have to behave differently depending on who they interact with.  Clinician inquired about Johnny Delgado's motivation towards engaging in family therapy with their parents in order to work towards increasing understanding and support in the relationship.  Clinician also discussed additional resources which could be of benefit to Johnny Delgado, including support groups in the community which could offer safe space to meet peers with similar experiences and expand upon support network.  Interventions were effective, as evidenced by Johnny Delgado stating "I feel more confident about being just myself around other people and taking steps to further cement my personal identity".  Johnny Delgado reported that they would also do research into family counselors with expertise on gender identity/sexuality and request that their parents consider engaging in this with Johnny Delgado.  Johnny Delgado reported that they would also look into outreaching lawyers that specialize in legally changing name in order to take steps towards further embracing identity. Clinician will continue to monitor.    Plan: Meet again in 1 week.   Diagnosis: Major Depressive Disorder, recurrent, severe; Generalized Anxiety Disorder; and ADHD.   Shade Flood, LCSW, LCAS 10/07/2020

## 2020-10-14 ENCOUNTER — Other Ambulatory Visit: Payer: Self-pay

## 2020-10-14 ENCOUNTER — Ambulatory Visit (INDEPENDENT_AMBULATORY_CARE_PROVIDER_SITE_OTHER): Payer: BC Managed Care – PPO | Admitting: Licensed Clinical Social Worker

## 2020-10-14 DIAGNOSIS — F411 Generalized anxiety disorder: Secondary | ICD-10-CM

## 2020-10-14 DIAGNOSIS — F332 Major depressive disorder, recurrent severe without psychotic features: Secondary | ICD-10-CM

## 2020-10-14 DIAGNOSIS — F902 Attention-deficit hyperactivity disorder, combined type: Secondary | ICD-10-CM | POA: Diagnosis not present

## 2020-10-14 NOTE — Progress Notes (Signed)
THERAPIST PROGRESS NOTE   Session Time: 1:00pm - 2:00pm  Location: Patient: [facility] Provider: [facility] [facility]    Participation Level: Active   Behavioral Response: Alert, casually dressed, depressed mood/affect   Type of Therapy: Individual Therapy    Treatment Goals addressed: Coping with depression, anxiety, and panic attacks; Medication compliance; Employment    Interventions: CBT, strengths based, problem solving   Summary: Johnny Delgado, who prefers to go by "Johnny Delgado" is a 25 year old single Caucasian non-binary that presented today for therapy appointment with diagnosis of Major Depressive Disorder, recurrent, severe; Generalized Anxiety Disorder; and ADHD.       Suicidal/Homicidal: None; without intent or plan.    Therapist Response:  Clinician met with Johnny Delgado for in-person therapy appointment and assessed for safety, medication compliance, and sobriety.  Johnny Delgado presented for this session on time and was alert, oriented x5, with no evidence or self-report of active SI/HI or A/V H.  Johnny Delgado reported ongoing compliance with medication and denied any abuse of alcohol or illicit substances.  Clinician inquired about Johnny Delgado's current emotional ratings today, as well as any significant changes in thoughts, feelings or behavior since last check-in.  Johnny Delgado reported scores of 7/10 for depression, 5/10 for anxiety, 0/10 for anger/irritability.  They denied experiencing any panic attacks. Johnny Delgado reported that their depression has been higher over past week, and this was due to work related stress, stating "I don't want to stay there.  I don't get much fulfillment out of it.  I just go in, do what they tell me, and leave every day".  Clinician assisted Johnny Delgado in exploring alternative jobs which could offer less stress and greater job satisfaction by using internet to look at current job posting sites.  Clinician also utilized skills assessment handout with Johnny Delgado to identify transferable skills which  could make them a good candidate for certain jobs, as well as areas of growth.  Johnny Delgado participated in discussion on the topic, and expressed interest in several sales positions, noting numerous unique skills that would make them competitive, including customer service, leadership, flexibility, multitasking, and professionalism.  Johnny Delgado took notes on their phone to apply for specific positions later, but reported that their resume has been out of date for some time now.  Clinician assisted Johnny Delgado further by utilizing a handout with examples of typical resume layouts to give them ideas on how to structure their own during job hunting process.  Interventions were effective, as evidenced by Johnny Delgado stating "This was really good.  I feel more relaxed and this gave me a lot of ideas.  Its about time I sit down with my resume and properly update it so I can find something new with a better change of pace".  Clinician will continue to monitor.     Plan: Meet again in 1 week.   Diagnosis: Major Depressive Disorder, recurrent, severe; Generalized Anxiety Disorder; and ADHD.   Shade Flood, [place], LCAS 10/14/2020

## 2020-10-21 ENCOUNTER — Ambulatory Visit (INDEPENDENT_AMBULATORY_CARE_PROVIDER_SITE_OTHER): Payer: BC Managed Care – PPO | Admitting: Licensed Clinical Social Worker

## 2020-10-21 ENCOUNTER — Other Ambulatory Visit: Payer: Self-pay

## 2020-10-21 DIAGNOSIS — F411 Generalized anxiety disorder: Secondary | ICD-10-CM | POA: Diagnosis not present

## 2020-10-21 DIAGNOSIS — F332 Major depressive disorder, recurrent severe without psychotic features: Secondary | ICD-10-CM

## 2020-10-21 DIAGNOSIS — F902 Attention-deficit hyperactivity disorder, combined type: Secondary | ICD-10-CM | POA: Diagnosis not present

## 2020-10-21 NOTE — Progress Notes (Signed)
THERAPIST PROGRESS NOTE   Session Time: 1:00pm - 1:50pm  Location: Patient: [facility] [place] [facility] Provider: [facility] [facility]    Participation Level: Active   Behavioral Response: Alert, casually dressed, depressed mood/affect   Type of Therapy: Individual Therapy    Treatment Goals addressed: Coping with depression, anxiety, and panic attacks; Medication compliance; Employment    Interventions: CBT, guided imagery    Summary: [name], who prefers to go by "[name]" is a 25 year old single Caucasian non-binary that presented today for therapy appointment with diagnosis of Major Depressive Disorder, recurrent, severe; Generalized Anxiety Disorder; and ADHD.       Suicidal/Homicidal: None; without intent or plan.    Therapist Response:  Clinician met with [name] for in-person therapy session and assessed for safety, medication compliance, and sobriety.  [name] presented for this appointment on time and was alert, oriented x5, with no evidence or self-report of active SI/HI or A/V H.  [name] reported that they continue taking medication as prescribed and denied any abuse of alcohol or illicit substances.  Clinician inquired about [name]'s emotional ratings today, as well as any significant changes in thoughts, feelings or behavior since previous check-in.  [name] reported scores of 4/10 for depression, 3/10 for anxiety, 0/10 for anger/irritability.  They denied experiencing any panic attacks. [name] reported that over the past week they attended a wedding with some family members, and had a good time.  They also reported maintaining normal schedule at work, and checking in with parents a few days ago about budgeting.  [name] reported that they have been using grounding techniques to distract from negative feelings when needed, but they are not always effective.  Clinician provided [name] with a few options of new relaxation techniques to practice today in session, and [name] expressed interest in guided  imagery, so clinician offered various scripts for visualizations (i.e. beach, mountain, etc) which might be appealing.  [name] chose a mountain setting, so clinician guided [name] through process of getting comfortable, achieving a relaxing breathing pattern, and narrated from visualization handout involving visiting a cabin in the mountains during the winter.  Various pleasing sensory details were included (i.e. warmth of the fireplace, smell of pine needles, sound of snow crunching underfoot, sensation of cool window, etc) during exercise to enhance experience over course of 15 minutes.  Intervention was effective, as evidenced by [employer] participating in activity successfully and reporting that this led to feelings of relaxation and alleviated some tension as well.  [name] stated "It wasn't very difficult to concentrate.  I could hear the snow under my feet, see the fireplace, and the wind outside. I would definitely try it again".  Clinician provided [name] with a handout of this guided imagery script and encouraged them to practice it as part of developing self-care routine.  Clinician will continue to monitor.     Plan: Meet again in 1 week.   Diagnosis: Major Depressive Disorder, recurrent, severe; Generalized Anxiety Disorder; and ADHD.   Shade Flood, LCSW, LCAS 10/21/2020

## 2020-10-25 ENCOUNTER — Other Ambulatory Visit: Payer: Self-pay

## 2020-10-25 ENCOUNTER — Encounter (HOSPITAL_COMMUNITY): Payer: Self-pay

## 2020-10-25 ENCOUNTER — Ambulatory Visit (HOSPITAL_COMMUNITY)
Admission: EM | Admit: 2020-10-25 | Discharge: 2020-10-25 | Disposition: A | Discharge status: Home / Self Care | Payer: BC Managed Care – PPO | Attending: Internal Medicine | Admitting: Internal Medicine

## 2020-10-25 DIAGNOSIS — J069 Acute upper respiratory infection, unspecified: Secondary | ICD-10-CM | POA: Diagnosis not present

## 2020-10-25 MED ORDER — BENZONATATE 100 MG PO CAPS: 100.0000 mg | ORAL_CAPSULE | Freq: Three times a day (TID) | ORAL | 0 refills | Status: DC

## 2020-10-25 MED ORDER — ONDANSETRON 4 MG PO TBDP: 4.0000 mg | ORAL_TABLET | Freq: Three times a day (TID) | ORAL | 0 refills | Status: DC | PRN

## 2020-10-25 NOTE — ED Provider Notes (Signed)
[facility]    CSN: [id number] Arrival date & time: 10/25/20  1741      History   Chief Complaint Chief Complaint  Patient presents with  . URI    HPI Johnny Delgado is a 25 y.o. adult to the urgent care with a 2-day history of wet cough, clear rhinorrhea and a sore throat.  Patient says symptoms started yesterday and has been persistent.  He is fully vaccinated against COVID-19 virus.  He denies any generalized body aches.  No shortness of breath.  He admits to having some wheezing.  No chest pain or chest pressure.  He is experiencing nausea without vomiting.  He has had 1 episode of loose bowel movement.   HPI  Past Medical History:  Diagnosis Date  . ADHD (attention deficit hyperactivity disorder)   . Anxiety   . Depression     Patient Active Problem List   Diagnosis Date Noted  . Anxiety disorder, unspecified 08/17/2020  . Suicide attempt (HCC) 08/13/2020  . Acetaminophen overdose 08/11/2020  . Endotracheally intubated 08/11/2020  . Hypokalemia 08/11/2020  . Acute respiratory failure (HCC)   . Dehydration 07/13/2019  . Gastroenteritis 07/13/2019  . ADHD (attention deficit hyperactivity disorder), inattentive type 07/30/2017  . Severe recurrent major depression without psychotic features (HCC) 07/20/2017    Past Surgical History:  Procedure Laterality Date  . WISDOM TOOTH EXTRACTION Bilateral 2015   All 4 extracted       Home Medications    Prior to Admission medications   Medication Sig Start Date End Date Taking? Authorizing Provider  benzonatate (TESSALON) 100 MG capsule Take 1 capsule (100 mg total) by mouth every 8 (eight) hours. 10/25/20  Yes Amarrah Meinhart, Britta Mccreedy, MD  ondansetron (ZOFRAN ODT) 4 MG disintegrating tablet Take 1 tablet (4 mg total) by mouth every 8 (eight) hours as needed for nausea or vomiting. 10/25/20  Yes Veer Elamin, Britta Mccreedy, MD  FLUoxetine (PROZAC) 20 MG capsule Take 1 capsule (20 mg total) by mouth daily. 08/18/20   Laveda Abbe, NP  mirtazapine (REMERON) 15 MG tablet Take 1 tablet (15 mg total) by mouth at bedtime. 08/17/20   Laveda Abbe, NP    Family History Family History  Problem Relation Age of Onset  . Depression Mother   . Drug abuse Mother   . Depression Father   . Drug abuse Father   . ADD / ADHD Brother   . Drug abuse Paternal Grandmother     Social History Social History   Tobacco Use  . Smoking status: Never Smoker  . Smokeless tobacco: Never Used  Vaping Use  . Vaping Use: Some days  Substance Use Topics  . Alcohol use: No  . Drug use: No     Allergies   Banana   Review of Systems Review of Systems  Constitutional: Negative.   HENT: Negative.   Respiratory: Negative.   Gastrointestinal: Positive for diarrhea and nausea. Negative for abdominal pain and vomiting.  Neurological: Negative.  Negative for headaches.     Physical Exam Triage Vital Signs ED Triage Vitals  Enc Vitals Group     BP 10/25/20 1920 (!) 139/91     Pulse Rate 10/25/20 1920 100     Resp 10/25/20 1920 17     Temp 10/25/20 1920 98.3 F (36.8 C)     Temp Source 10/25/20 1920 Oral     SpO2 10/25/20 1920 97 %     Weight --  Height --      Head Circumference --      Peak Flow --      Pain Score 10/25/20 1922 3     Pain Loc --      Pain Edu? --      Excl. in GC? --    No data found.  Updated Vital Signs BP (!) 139/91 (BP Location: Right Arm)   Pulse 100   Temp 98.3 F (36.8 C) (Oral)   Resp 17   SpO2 97%   Visual Acuity Right Eye Distance:   Left Eye Distance:   Bilateral Distance:    Right Eye Near:   Left Eye Near:    Bilateral Near:     Physical Exam Vitals and nursing note reviewed.  Constitutional:      General: Johnny Barbara "[name]" is not in acute distress.    Appearance: CLOYDE OREGEL "[name]" is not ill-appearing or diaphoretic.  HENT:     Right Ear: Tympanic membrane normal.     Left Ear: Tympanic membrane normal.     Mouth/Throat:      Pharynx: No posterior oropharyngeal erythema.  Cardiovascular:     Rate and Rhythm: Normal rate and regular rhythm.  Pulmonary:     Effort: Pulmonary effort is normal.     Breath sounds: Normal breath sounds.  Abdominal:     General: Bowel sounds are normal.     Palpations: Abdomen is soft.  Neurological:     Mental Status: Johnny VANDERWALL "[name]" is alert.      UC Treatments / Results  Labs (all labs ordered are listed, but only abnormal results are displayed) Labs Reviewed - No data to display  EKG   Radiology No results found.  Procedures Procedures (including critical care time)  Medications Ordered in UC Medications - No data to display  Initial Impression / Assessment and Plan / UC Course  I have reviewed the triage vital signs and the nursing notes.  Pertinent labs & imaging results that were available during my care of the patient were reviewed by me and considered in my medical decision making (see chart for details).     1.  Viral URI with cough: Increase oral fluid intake Tessalon Perles as needed for cough Zofran as needed for nausea/vomiting Patient's home COVID test was negative Return to urgent care if symptoms worsen. Final Clinical Impressions(s) / UC Diagnoses   Final diagnoses:  Viral URI with cough     Discharge Instructions     Increase oral fluid intake  Take medication as directed  If symptoms worsen -please return to urgent care to be re-evaluated.    ED Prescriptions    Medication Sig Dispense Auth. Provider   benzonatate (TESSALON) 100 MG capsule Take 1 capsule (100 mg total) by mouth every 8 (eight) hours. 21 capsule Toyna Erisman, Britta Mccreedy, MD   ondansetron (ZOFRAN ODT) 4 MG disintegrating tablet Take 1 tablet (4 mg total) by mouth every 8 (eight) hours as needed for nausea or vomiting. 20 tablet Ceria Suminski, Britta Mccreedy, MD     PDMP not reviewed this encounter.   Merrilee Jansky, MD 10/25/20 1949

## 2020-10-25 NOTE — ED Triage Notes (Signed)
Pt presents with cough, nasal drainage, and congestion since yesterday.

## 2020-10-25 NOTE — Discharge Instructions (Addendum)
Increase oral fluid intake  Take medication as directed  If symptoms worsen -please return to urgent care to be re-evaluated.

## 2020-11-04 ENCOUNTER — Ambulatory Visit (INDEPENDENT_AMBULATORY_CARE_PROVIDER_SITE_OTHER): Payer: BC Managed Care – PPO | Admitting: Licensed Clinical Social Worker

## 2020-11-04 ENCOUNTER — Other Ambulatory Visit: Payer: Self-pay

## 2020-11-04 DIAGNOSIS — F411 Generalized anxiety disorder: Secondary | ICD-10-CM | POA: Diagnosis not present

## 2020-11-04 DIAGNOSIS — F332 Major depressive disorder, recurrent severe without psychotic features: Secondary | ICD-10-CM

## 2020-11-04 DIAGNOSIS — F902 Attention-deficit hyperactivity disorder, combined type: Secondary | ICD-10-CM | POA: Diagnosis not present

## 2020-11-04 NOTE — Progress Notes (Signed)
THERAPIST PROGRESS NOTE   Session Time: 1:00pm - 2:00pm  Location: Patient: [facility] [place] [facility] Provider: [facility] [place] [facility]    Participation Level: Active   Behavioral Response: Alert, casually dressed, anxious mood/affect   Type of Therapy: Individual Therapy    Treatment Goals addressed: Coping with depression, anxiety, and panic attacks; Medication compliance; Employment    Interventions: CBT: challenging anxious thoughts, assertive communication skills   Summary: Johnny Delgado, who prefers to go by "Johnny Delgado" is a 25 year old single Caucasian non-binary that presented today for therapy appointment with diagnosis of Major Depressive Disorder, recurrent, severe; Generalized Anxiety Disorder; and ADHD.       Suicidal/Homicidal: None; without intent or plan.    Therapist Response:  Clinician met with Johnny Delgado for in-person therapy appointment and assessed for safety, medication compliance, and sobriety.  Johnny Delgado presented for this session on time and was alert, oriented x5, with no evidence or self-report of active SI/HI or A/V H.  Johnny Delgado reported that they remain compliant with prescribed medications and denied any abuse of alcohol or illicit substances.  Clinician inquired about Johnny Delgado's current emotional ratings, as well as any significant changes in thoughts, feelings or behavior since last check-in.  Johnny Delgado reported scores of 5/10 for depression, 6/10 for anxiety, 0/10 for anger/irritability.  They denied experiencing any panic attacks or outbursts. Johnny Delgado reported that they decided to put in their two weeks notice at current job following an incident where a coworker used a derogatory term towards them.  Johnny Delgado reported that there have been numerous issues like this in the workplace which reinforced decision to leave, but now they are anxious about informing their parents.  Clinician utilized a handout with Johnny Delgado today to assist in challenging related anxious thoughts through series of questions identifying  about what the specific worry is, in addition to comparing what could happen versus what is most likely to happen based upon past experience in order to reinforce rational thinking process.  Johnny Delgado engaged in activity and noted that they are worried the parents will react negatively, and although delivering news of this nature in the past has not always been received positively, there have been reasonably calm, understanding responses when news is delivered the proper way.  Clinician assisted Johnny Delgado in practicing communication skills in session today in order to deliver this news in a favorable manner, offering additional suggestions on how to maintain eye contact, since this is an area they struggle with frequently.  Johnny Delgado participated in roleplay activity and noted that it would be helpful to talk to both parents individually first since it can feel like being ganged up upon if they are approached simultaneously.  Johnny Delgado reported that they would also start the conversation by informing the parents that there are already 2 jobs lined up to replace this one, since this could alleviate some of their own anxieties.  Interventions were effective, as evidenced by Johnny Delgado reporting that both depression and anxiety severity were reduced to 3/10 following this conversation, and stated "I feel more confident in my decision and have a better idea of how to approach both of them.  I had to do the right thing for my mental health and feel validated for that".  Clinician will continue to monitor.     Plan: Meet again in 1 week.   Diagnosis: Major Depressive Disorder, recurrent, severe; Generalized Anxiety Disorder; and ADHD.   Johnny Delgado, Johnny Delgado, LCAS 11/04/2020

## 2020-11-11 ENCOUNTER — Ambulatory Visit (HOSPITAL_COMMUNITY): Payer: BC Managed Care – PPO | Admitting: Licensed Clinical Social Worker

## 2022-02-25 IMAGING — DX DG ABDOMEN 1V
1 series · 1 of 1 positions shown · non-contrast
Comparison: CT 07/13/2019.

CLINICAL DATA: NG tube placement

EXAM:
ABDOMEN - 1 VIEW

[abdomen kub]
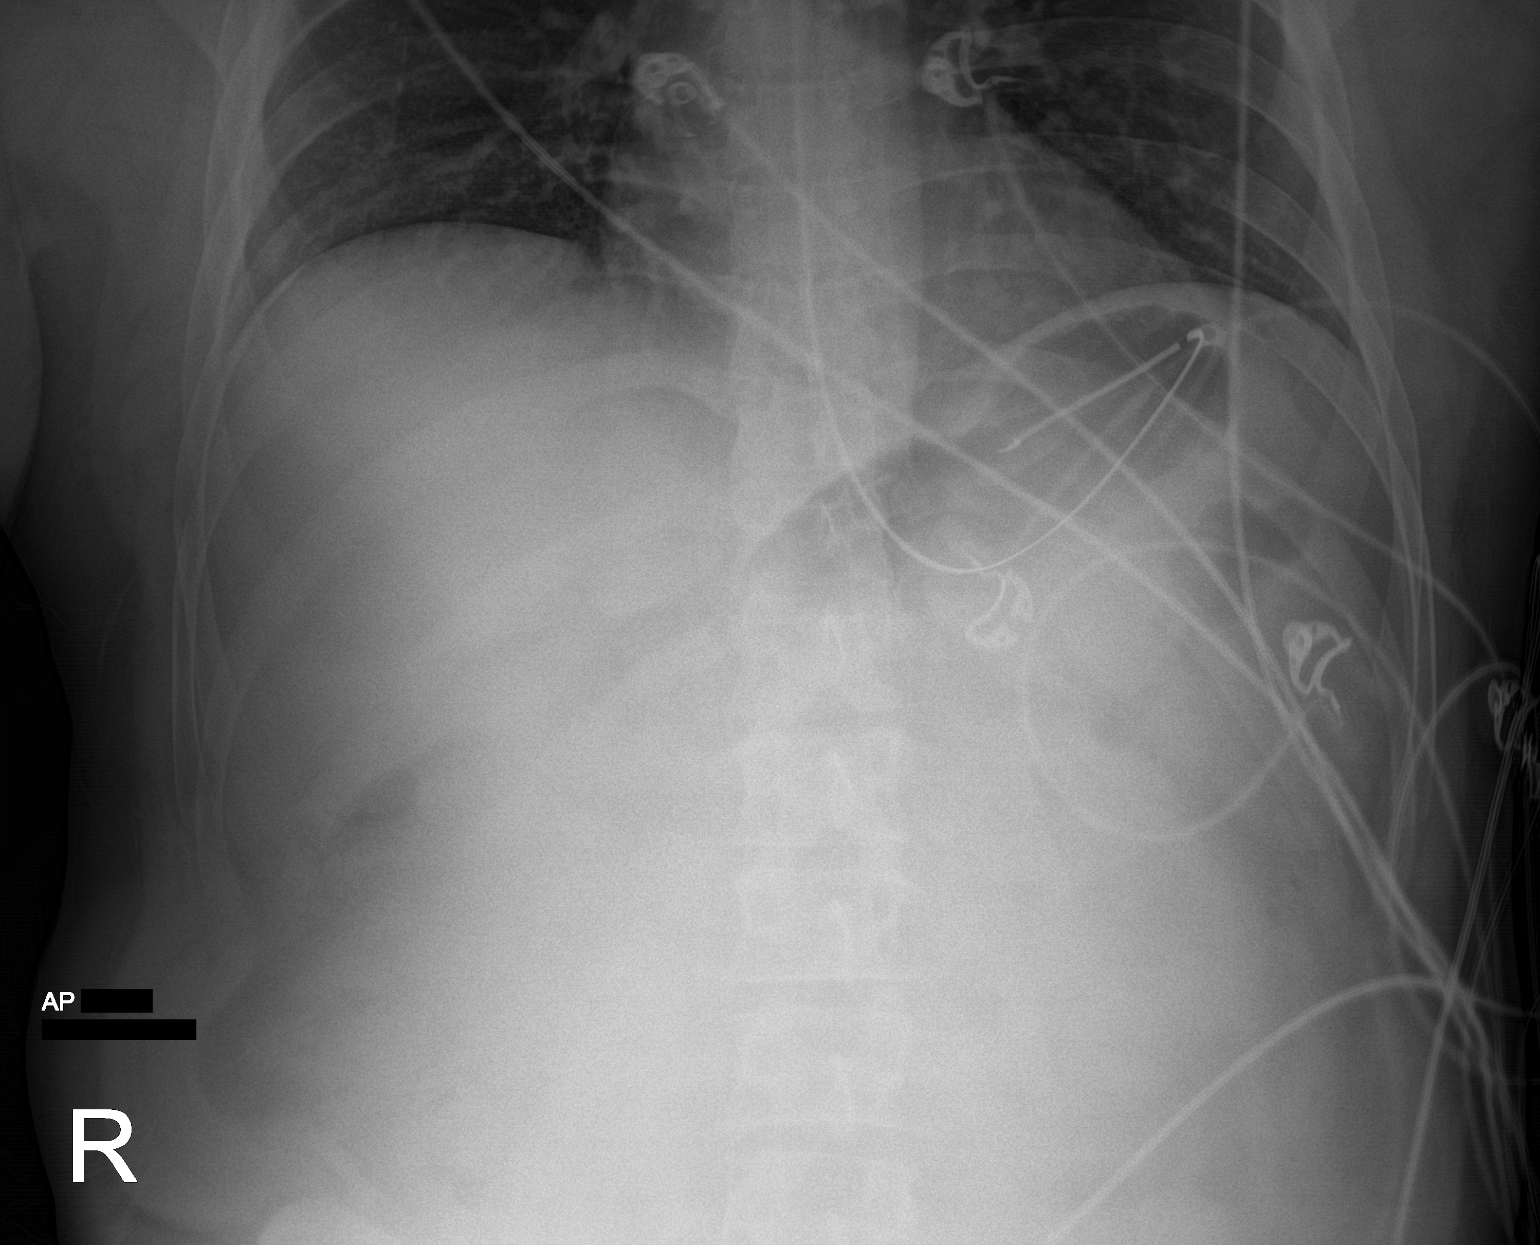

[1 of 1 positions shown; findings below may reference images not displayed]

FINDINGS: Transesophageal tube tip and side port terminate distal to the GE
junction. Lung bases are clear. There is a relative paucity of upper
abdominal bowel gas which is a nonspecific finding which can be seen
both is a normal finding or in the setting of obstruction, correlate
with abdominal findings. No visible subdiaphragmatic free air on
this semi upright exam. No worrisome calcifications.
IMPRESSION: 1. Transesophageal tube tip and side port terminate distal to the GE
junction.
2. Paucity of upper abdominal bowel gas, a nonspecific finding.
3. No visible subdiaphragmatic free air on this semi upright exam.

## 2022-02-26 IMAGING — DX DG CHEST 1V PORT
1 series · 1 of 1 positions shown · non-contrast
Comparison: Film from the previous day.

CLINICAL DATA: Status post intubation

EXAM:
PORTABLE CHEST 1 VIEW

[chest ap]
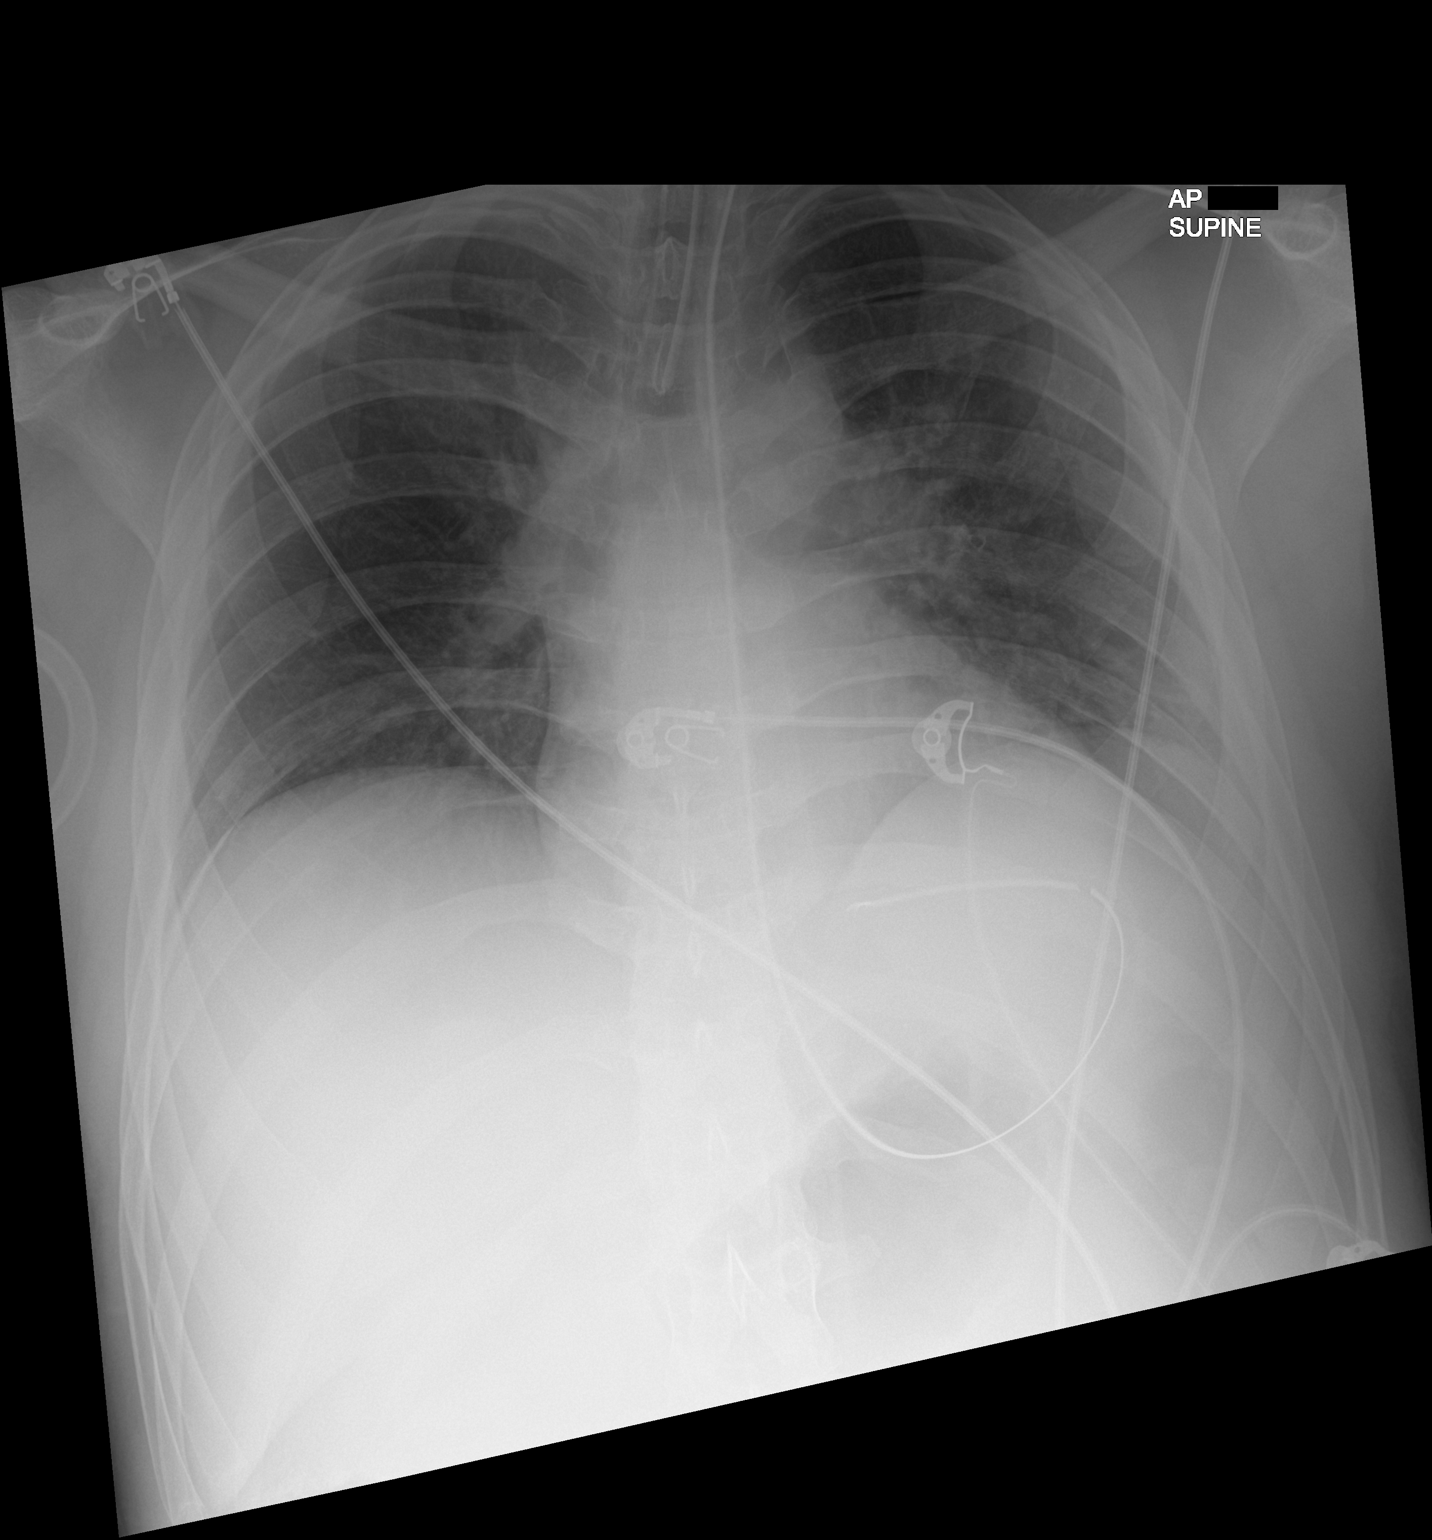

[1 of 1 positions shown; findings below may reference images not displayed]

FINDINGS: Endotracheal tube and gastric catheter are noted in satisfactory
position. Cardiac shadow is within normal limits. Hypoinflation is
again seen. The lungs are clear. No bony abnormality is noted.
IMPRESSION: Tubes and lines are noted in satisfactory position.

Overall poor inspiratory effort without acute abnormality.

## 2022-02-27 IMAGING — DX DG ABDOMEN 1V
2 series · 2 of 2 positions shown · non-contrast
Comparison: Portable abdomen 08/10/2020. CT Abdomen and Pelvis
07/13/2019.

CLINICAL DATA: 24-year-old male with history of prior small bowel
obstruction. Recent overdose.

EXAM:
ABDOMEN - 1 VIEW

[abdomen kub (1 of 2)]
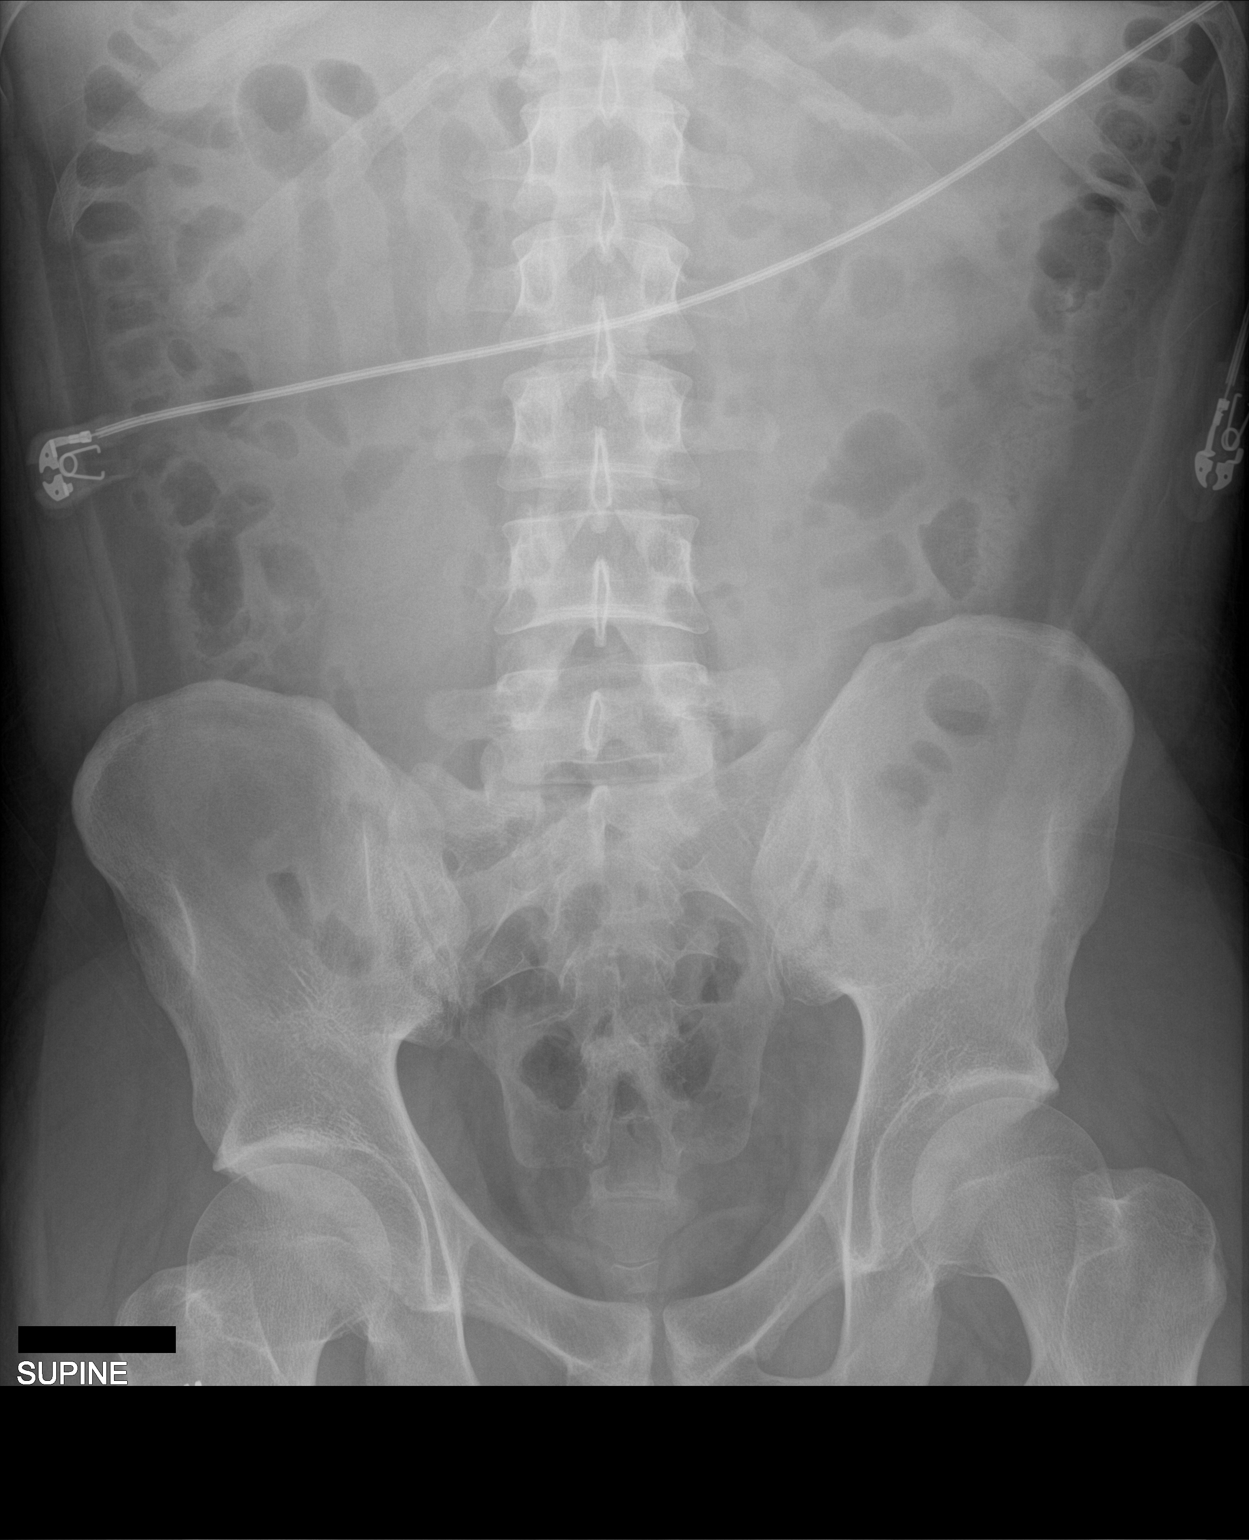

[abdomen kub (2 of 2)]
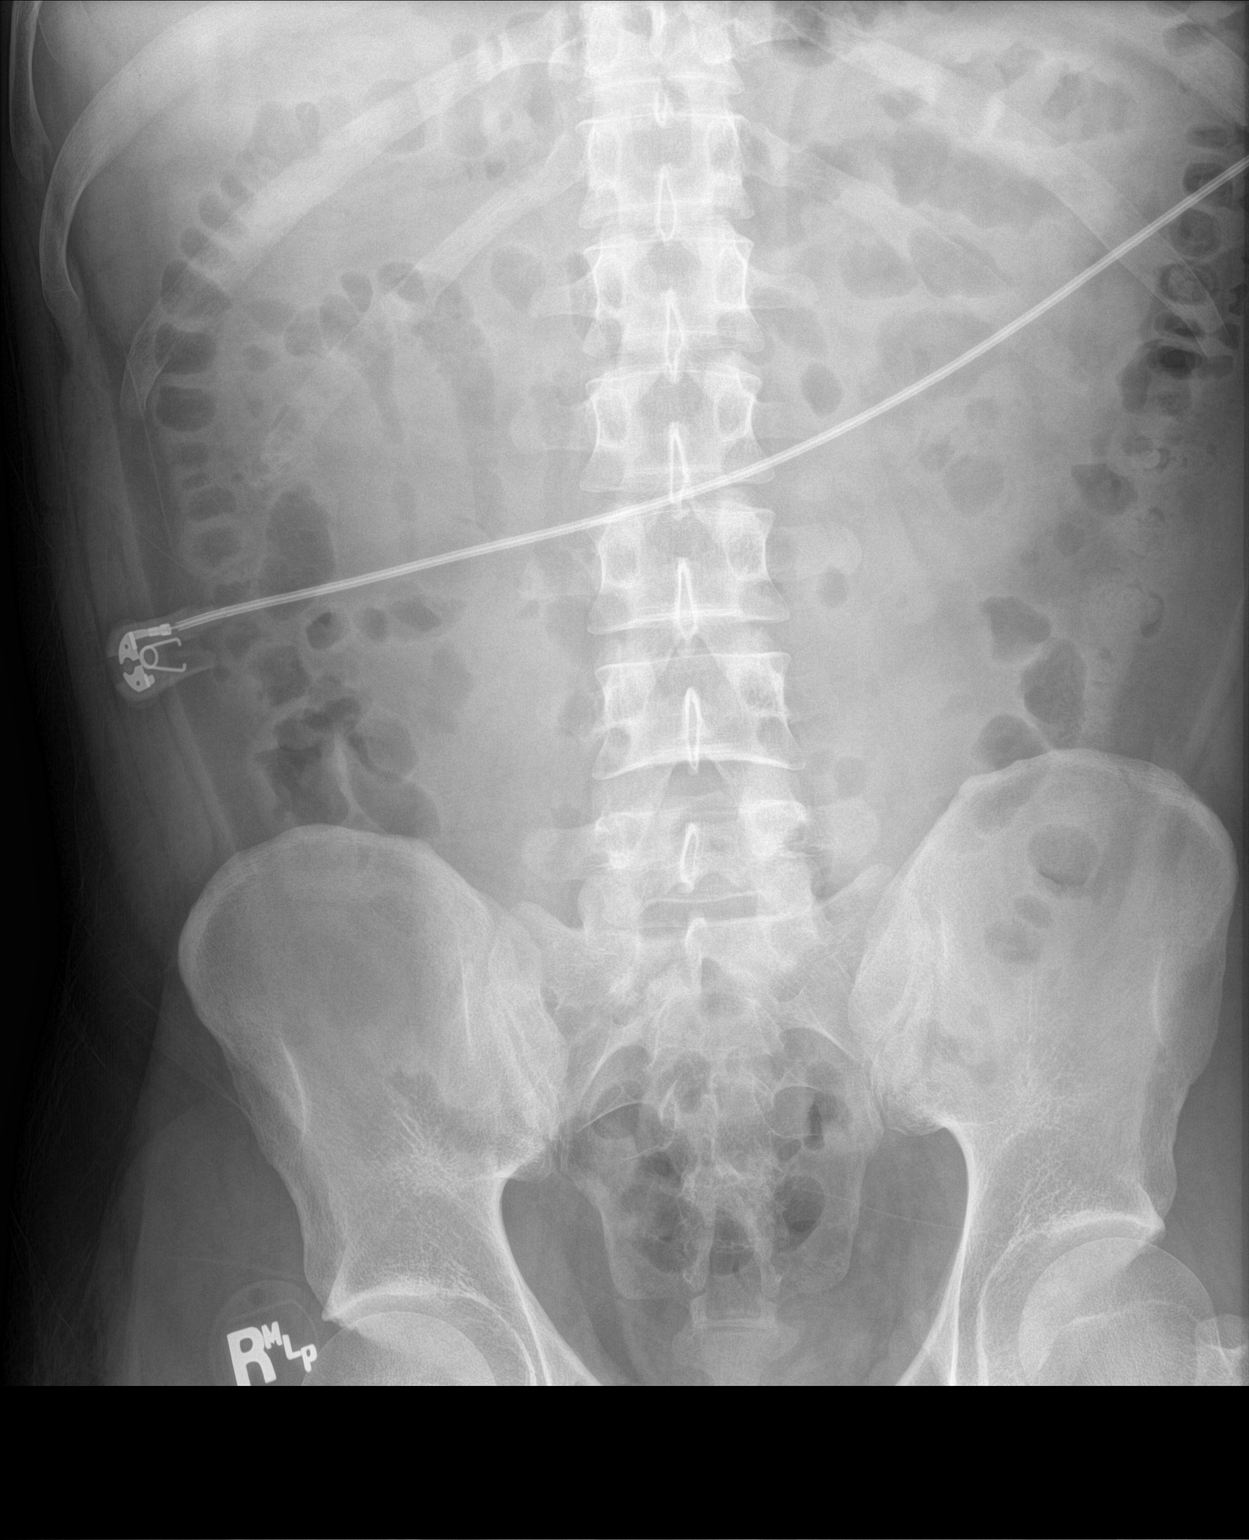

[2 of 2 positions shown; findings below may reference images not displayed]

FINDINGS: Portable AP supine views at 1000 hours. Enteric tube no longer
visible. Non obstructed bowel gas pattern. Visible abdominal and
pelvic visceral contours appear normal. No osseous abnormality
identified.
IMPRESSION: Enteric tube no longer visible.  Normal bowel gas pattern.

## 2022-02-27 IMAGING — DX DG CHEST 1V PORT
1 series · 1 of 1 positions shown · non-contrast
Comparison: Portable chest 08/11/2020 and earlier.

CLINICAL DATA: 24-year-old male status post overdose.  Extubated.

EXAM:
PORTABLE CHEST 1 VIEW

[chest ap]
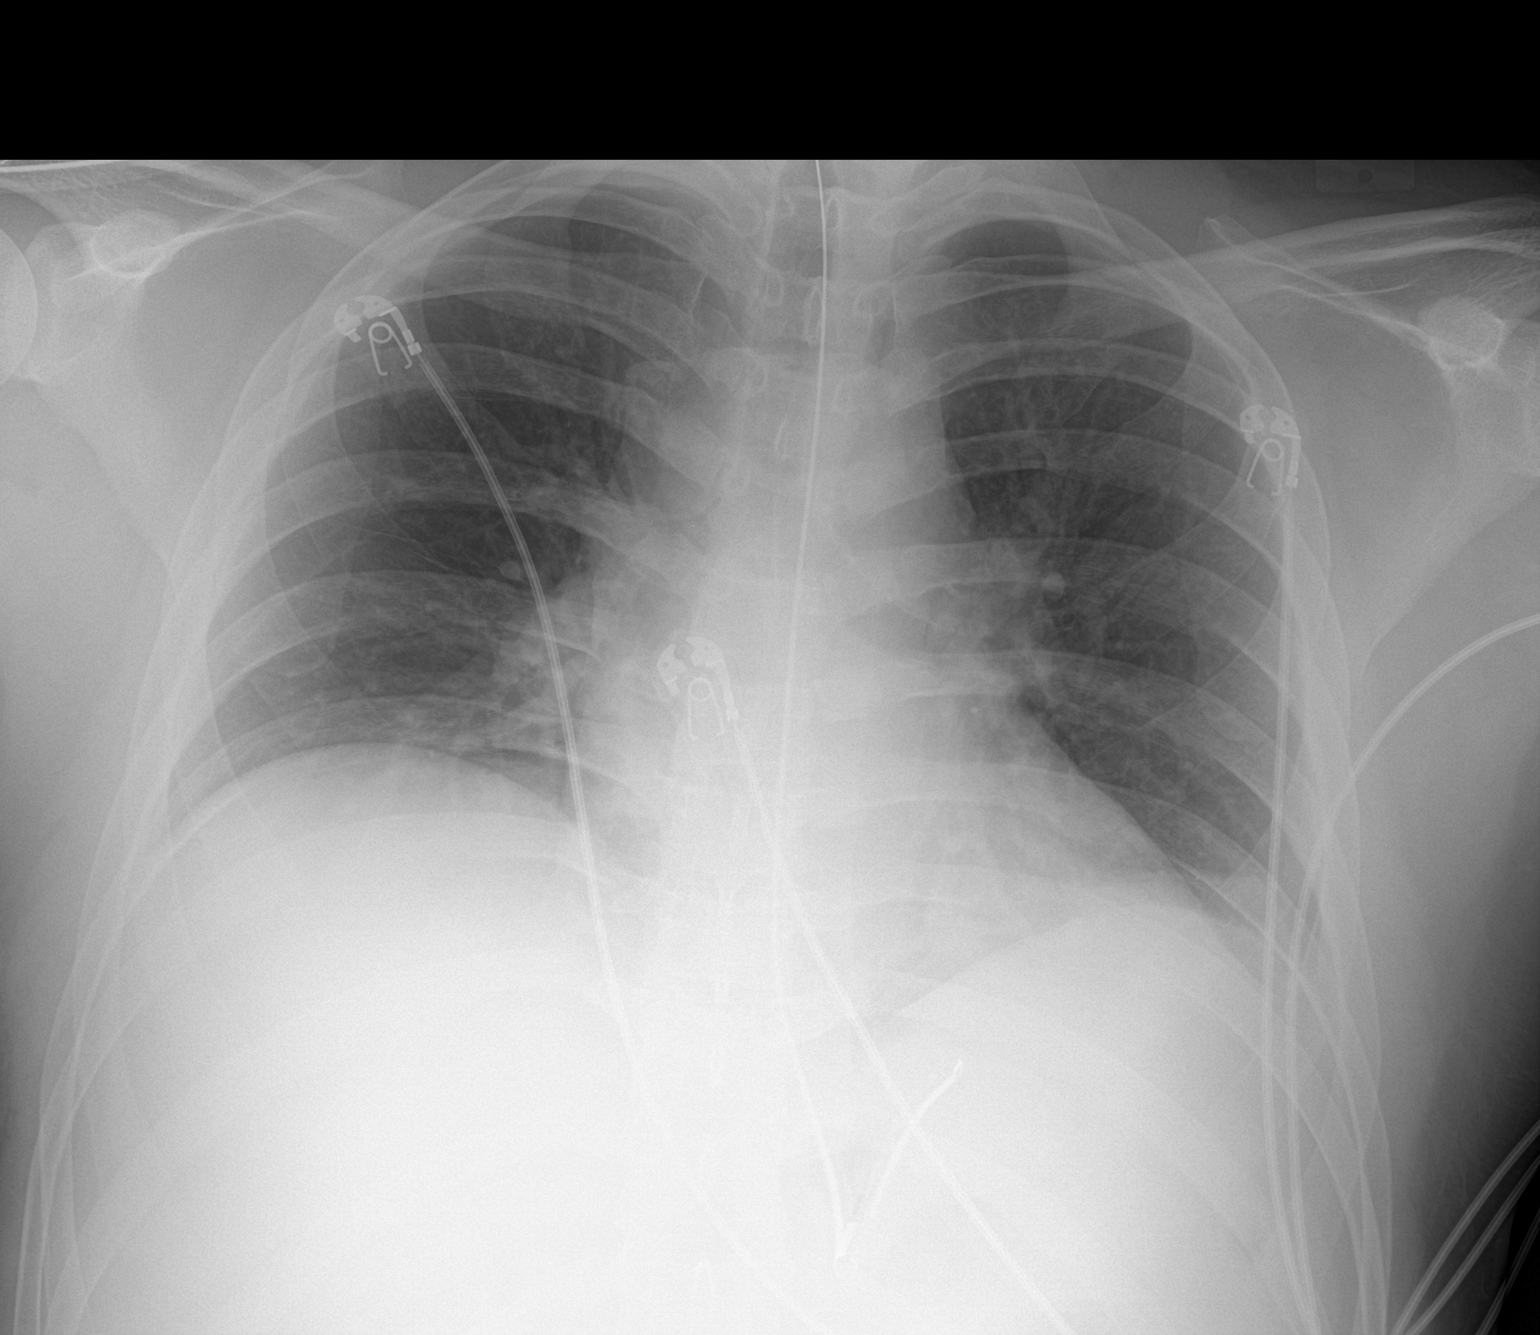

[1 of 1 positions shown; findings below may reference images not displayed]

FINDINGS: Enteric tube position not significantly changed. Extubated. Low but
mildly improved lung volumes compared to yesterday. Mediastinal
contours remain within normal limits. No pneumothorax, pulmonary
edema or pleural effusion. Streaky perihilar opacity, likely
atelectasis, has decreased but not resolved. No areas of worsening
ventilation.

No osseous abnormality identified. Paucity of bowel gas in the upper
abdomen.
IMPRESSION: 1. Extubated with mildly improved lung volumes and decreased
perihilar atelectasis. No new cardiopulmonary abnormality.
2. Stable enteric tube.

## 2022-05-05 DIAGNOSIS — Z419 Encounter for procedure for purposes other than remedying health state, unspecified: Secondary | ICD-10-CM | POA: Diagnosis not present

## 2022-05-12 ENCOUNTER — Encounter (HOSPITAL_COMMUNITY): Payer: Self-pay

## 2022-05-12 ENCOUNTER — Ambulatory Visit (HOSPITAL_COMMUNITY)
Admission: EM | Admit: 2022-05-12 | Discharge: 2022-05-12 | Disposition: A | Discharge status: Home / Self Care | Payer: Medicaid Other | Attending: Nurse Practitioner | Admitting: Nurse Practitioner

## 2022-05-12 ENCOUNTER — Ambulatory Visit (HOSPITAL_COMMUNITY): Admission: EM | Admit: 2022-05-12 | Payer: Self-pay

## 2022-05-12 DIAGNOSIS — F17290 Nicotine dependence, other tobacco product, uncomplicated: Secondary | ICD-10-CM | POA: Insufficient documentation

## 2022-05-12 DIAGNOSIS — H9209 Otalgia, unspecified ear: Secondary | ICD-10-CM | POA: Diagnosis not present

## 2022-05-12 DIAGNOSIS — R058 Other specified cough: Secondary | ICD-10-CM | POA: Diagnosis not present

## 2022-05-12 DIAGNOSIS — R0981 Nasal congestion: Secondary | ICD-10-CM | POA: Diagnosis not present

## 2022-05-12 DIAGNOSIS — R6889 Other general symptoms and signs: Secondary | ICD-10-CM

## 2022-05-12 DIAGNOSIS — Z1152 Encounter for screening for COVID-19: Secondary | ICD-10-CM | POA: Insufficient documentation

## 2022-05-12 DIAGNOSIS — J029 Acute pharyngitis, unspecified: Secondary | ICD-10-CM | POA: Insufficient documentation

## 2022-05-12 DIAGNOSIS — R112 Nausea with vomiting, unspecified: Secondary | ICD-10-CM | POA: Insufficient documentation

## 2022-05-12 LAB — RESP PANEL BY RT-PCR (FLU A&B, COVID) ARPGX2
Influenza A by PCR: NEGATIVE
Influenza B by PCR: NEGATIVE
SARS Coronavirus 2 by RT PCR: NEGATIVE

## 2022-05-12 MED ORDER — ONDANSETRON 4 MG PO TBDP: 4.0000 mg | ORAL_TABLET | Freq: Three times a day (TID) | ORAL | 0 refills | Status: DC | PRN

## 2022-05-12 NOTE — ED Provider Notes (Signed)
[facility]    CSN: [id number] Arrival date & time: 05/12/22  1315      History   Chief Complaint Chief Complaint  Patient presents with   Cough   Sore Throat   Nausea   Shortness of Breath   Abdominal Pain    HPI Johnny Delgado is a 26 y.o. adult presents for evaluation of flulike symptoms.  Patient reports yesterday he developed flulike symptoms including sore throat, ear pain, body aches, congestion, runny nose, cough, and nausea/vomiting/diarrhea.  Denies any known fevers but endorses chills.  No asthma history.  He is currently trying to quit vaping tobacco.  He is vaccinated for flu and COVID.  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  He has not taken any OTC medications for symptoms since onset.  He has no other concerns at this time.   Cough Associated symptoms: chills, ear pain and sore throat   Sore Throat  Shortness of Breath Associated symptoms: cough, ear pain, sore throat and vomiting   Abdominal Pain Associated symptoms: chills, cough, diarrhea, nausea, sore throat and vomiting     Past Medical History:  Diagnosis Date   ADHD (attention deficit hyperactivity disorder)    Anxiety    Depression     Patient Active Problem List   Diagnosis Date Noted   Anxiety disorder, unspecified 08/17/2020   Suicide attempt (HCC) 08/13/2020   Acetaminophen overdose 08/11/2020   Endotracheally intubated 08/11/2020   Hypokalemia 08/11/2020   Acute respiratory failure (HCC)    Dehydration 07/13/2019   Gastroenteritis 07/13/2019   ADHD (attention deficit hyperactivity disorder), inattentive type 07/30/2017   Severe recurrent major depression without psychotic features (HCC) 07/20/2017    Past Surgical History:  Procedure Laterality Date   WISDOM TOOTH EXTRACTION Bilateral 2015   All 4 extracted       Home Medications    Prior to Admission medications   Medication Sig Start Date End Date Taking? Authorizing Provider  ondansetron  (ZOFRAN-ODT) 4 MG disintegrating tablet Take 1 tablet (4 mg total) by mouth every 8 (eight) hours as needed for nausea or vomiting. 05/12/22  Yes Radford Pax, NP  benzonatate (TESSALON) 100 MG capsule Take 1 capsule (100 mg total) by mouth every 8 (eight) hours. 10/25/20   Merrilee Jansky, MD  FLUoxetine (PROZAC) 20 MG capsule Take 1 capsule (20 mg total) by mouth daily. 08/18/20   Laveda Abbe, NP  mirtazapine (REMERON) 15 MG tablet Take 1 tablet (15 mg total) by mouth at bedtime. 08/17/20   Laveda Abbe, NP    Family History Family History  Problem Relation Age of Onset   Depression Mother    Drug abuse Mother    Depression Father    Drug abuse Father    ADD / ADHD Brother    Drug abuse Paternal Grandmother     Social History Social History   Tobacco Use   Smoking status: Never   Smokeless tobacco: Never  Vaping Use   Vaping Use: Some days  Substance Use Topics   Alcohol use: No   Drug use: No     Allergies   Banana   Review of Systems Review of Systems  Constitutional:  Positive for chills.  HENT:  Positive for congestion, ear pain and sore throat.   Respiratory:  Positive for cough.   Gastrointestinal:  Positive for diarrhea, nausea and vomiting.     Physical Exam Triage Vital Signs ED Triage Vitals  Enc Vitals Group  BP 05/12/22 1434 120/77     Pulse Rate 05/12/22 1434 96     Resp 05/12/22 1434 12     Temp 05/12/22 1434 98.9 F (37.2 C)     Temp Source 05/12/22 1434 Oral     SpO2 --      Weight --      Height --      Head Circumference --      Peak Flow --      Pain Score 05/12/22 1431 6     Pain Loc --      Pain Edu? --      Excl. in GC? --    No data found.  Updated Vital Signs BP 120/77 (BP Location: Right Arm)   Pulse 96   Temp 98.9 F (37.2 C) (Oral)   Resp 12   Visual Acuity Right Eye Distance:   Left Eye Distance:   Bilateral Distance:    Right Eye Near:   Left Eye Near:    Bilateral Near:     Physical  Exam Vitals and nursing note reviewed.  Constitutional:      General: Johnny Barbara "[name]" is not in acute distress.    Appearance: Johnny TOTO "[name]" is well-developed. Johnny Barbara "[name]" is not ill-appearing.  HENT:     Head: Normocephalic and atraumatic.     Right Ear: Tympanic membrane and ear canal normal.     Left Ear: Tympanic membrane and ear canal normal.     Nose: Congestion present.     Mouth/Throat:     Mouth: Mucous membranes are moist.     Pharynx: Oropharynx is clear. Uvula midline. Posterior oropharyngeal erythema present.     Tonsils: No tonsillar exudate or tonsillar abscesses.  Eyes:     Conjunctiva/sclera: Conjunctivae normal.     Pupils: Pupils are equal, round, and reactive to light.  Cardiovascular:     Rate and Rhythm: Normal rate and regular rhythm.     Heart sounds: Normal heart sounds.  Pulmonary:     Effort: Pulmonary effort is normal.     Breath sounds: Normal breath sounds.  Abdominal:     General: Bowel sounds are normal.     Palpations: Abdomen is soft. There is no hepatomegaly, splenomegaly or mass.     Tenderness: There is generalized abdominal tenderness. Negative signs include Rovsing's sign and McBurney's sign.  Musculoskeletal:     Cervical back: Normal range of motion and neck supple.  Lymphadenopathy:     Cervical: No cervical adenopathy.  Skin:    General: Skin is warm and dry.  Neurological:     General: No focal deficit present.     Mental Status: Johnny CASALINO "[name]" is alert and oriented to person, place, and time.  Psychiatric:        Mood and Affect: Mood normal.        Behavior: Behavior normal.      UC Treatments / Results  Labs (all labs ordered are listed, but only abnormal results are displayed) Labs Reviewed  RESP PANEL BY RT-PCR (FLU A&B, COVID) [id number]    EKG   Radiology No results found.  Procedures Procedures (including critical care time)  Medications Ordered in UC Medications - No  data to display  Initial Impression / Assessment and Plan / UC Course  I have reviewed the triage vital signs and the nursing notes.  Pertinent labs & imaging results that were available during my care of the patient were  reviewed by me and considered in my medical decision making (see chart for details).     Reviewed exam and symptoms with patient.  No red flags on exam COVID and flu PCR.  If flu positive may start Tamiflu twice daily for 5 days Zofran as needed nausea Encouraged fluids and rest Follow-up with PCP 2 to 3 days for recheck ER precautions reviewed and patient verbalized understanding Final Clinical Impressions(s) / UC Diagnoses   Final diagnoses:  Flu-like symptoms     Discharge Instructions      Zofran as needed for nausea/vomiting Rest and stay hydrated by drinking Gatorade, Powerade, Pedialyte, ginger ale, water The clinical contact you if results of your testing is positive today Please follow-up with your PCP 2 to 3 days for recheck Please go to the emergency room if you have any worsening symptoms I hope you feel better soon asked patient to   ED Prescriptions     Medication Sig Dispense Auth. Provider   ondansetron (ZOFRAN-ODT) 4 MG disintegrating tablet Take 1 tablet (4 mg total) by mouth every 8 (eight) hours as needed for nausea or vomiting. 20 tablet Radford Pax, NP      PDMP not reviewed this encounter.   Radford Pax, NP 05/12/22 [phone]

## 2022-05-12 NOTE — Discharge Instructions (Signed)
Zofran as needed for nausea/vomiting Rest and stay hydrated by drinking Gatorade, Powerade, Pedialyte, ginger ale, water The clinical contact you if results of your testing is positive today Please follow-up with your PCP 2 to 3 days for recheck Please go to the emergency room if you have any worsening symptoms I hope you feel better soon asked patient to

## 2022-05-12 NOTE — ED Triage Notes (Signed)
Pt is here for sore throat, vomiting, diarrhea , light headed, bilateral ear pain, back pain, fatigue, runny nose, congestion and loss of appetite.

## 2022-06-05 DIAGNOSIS — Z419 Encounter for procedure for purposes other than remedying health state, unspecified: Secondary | ICD-10-CM | POA: Diagnosis not present

## 2022-07-06 DIAGNOSIS — Z419 Encounter for procedure for purposes other than remedying health state, unspecified: Secondary | ICD-10-CM | POA: Diagnosis not present

## 2022-08-04 ENCOUNTER — Telehealth: Payer: Self-pay

## 2022-08-04 DIAGNOSIS — Z419 Encounter for procedure for purposes other than remedying health state, unspecified: Secondary | ICD-10-CM | POA: Diagnosis not present

## 2022-08-04 NOTE — Telephone Encounter (Signed)
Mychart msg sent

## 2022-08-09 ENCOUNTER — Emergency Department (HOSPITAL_COMMUNITY)
Admission: EM | Admit: 2022-08-09 | Discharge: 2022-08-10 | Disposition: A | Discharge status: Home / Self Care | Payer: Medicaid Other | Attending: Emergency Medicine | Admitting: Emergency Medicine

## 2022-08-09 DIAGNOSIS — D696 Thrombocytopenia, unspecified: Secondary | ICD-10-CM

## 2022-08-09 DIAGNOSIS — R0602 Shortness of breath: Secondary | ICD-10-CM | POA: Diagnosis not present

## 2022-08-09 DIAGNOSIS — R001 Bradycardia, unspecified: Secondary | ICD-10-CM | POA: Diagnosis not present

## 2022-08-09 DIAGNOSIS — M545 Low back pain, unspecified: Secondary | ICD-10-CM | POA: Diagnosis not present

## 2022-08-09 DIAGNOSIS — Z789 Other specified health status: Secondary | ICD-10-CM | POA: Diagnosis not present

## 2022-08-09 DIAGNOSIS — R55 Syncope and collapse: Secondary | ICD-10-CM | POA: Diagnosis not present

## 2022-08-09 NOTE — ED Provider Notes (Incomplete)
  [facility] Provider Note   CSN: [id number] Arrival date & time: 08/09/22  2351     History {Add pertinent medical, surgical, social history, OB history to HPI:1} No chief complaint on file.   Johnny Delgado is a 27 y.o. adult.  HPI     Home Medications Prior to Admission medications   Medication Sig Start Date End Date Taking? Authorizing Provider  benzonatate (TESSALON) 100 MG capsule Take 1 capsule (100 mg total) by mouth every 8 (eight) hours. 10/25/20   Chase Picket, MD  FLUoxetine (PROZAC) 20 MG capsule Take 1 capsule (20 mg total) by mouth daily. 08/18/20   Ethelene Hal, NP  mirtazapine (REMERON) 15 MG tablet Take 1 tablet (15 mg total) by mouth at bedtime. 08/17/20   Ethelene Hal, NP  ondansetron (ZOFRAN-ODT) 4 MG disintegrating tablet Take 1 tablet (4 mg total) by mouth every 8 (eight) hours as needed for nausea or vomiting. 05/12/22   Melynda Ripple, NP      Allergies    Banana    Review of Systems   Review of Systems  Physical Exam Updated Vital Signs There were no vitals taken for this visit. Physical Exam  ED Results / Procedures / Treatments   Labs (all labs ordered are listed, but only abnormal results are displayed) Labs Reviewed - No data to display  EKG None  Radiology No results found.  Procedures Procedures  {Document cardiac monitor, telemetry assessment procedure when appropriate:1}  Medications Ordered in ED Medications - No data to display  ED Course/ Medical Decision Making/ A&P   {   Click here for ABCD2, HEART and other calculatorsREFRESH Note before signing :1}                          Medical Decision Making  ***  {Document critical care time when appropriate:1} {Document review of labs and clinical decision tools ie heart score, Chads2Vasc2 etc:1}  {Document your independent review of radiology images, and any outside records:1} {Document your discussion  with family members, caretakers, and with consultants:1} {Document social determinants of health affecting pt's care:1} {Document your decision making why or why not admission, treatments were needed:1} Final Clinical Impression(s) / ED Diagnoses Final diagnoses:  None    Rx / DC Orders ED Discharge Orders     None

## 2022-08-09 NOTE — ED Triage Notes (Signed)
Patient c/o syncope episode tonight. Per report pt past out for 5-6 sec at work. No report of fall. No report of injury. Pt denies N/V. Pt a/ox4.

## 2022-08-09 NOTE — ED Provider Notes (Signed)
[facility] Provider Note   CSN: [id number] Arrival date & time: 08/09/22  2351     History  Chief Complaint  Patient presents with   Near Syncope    Johnny Delgado is a 27 y.o. adult.  27 year old male presents with complaint of syncopal episode tonight.  Patient states that he was at work, stood up from a desk and felt short of breath with tunnel vision before passing out.  States that he slid down along the wall and woke up lying on the ground feeling like he bumped his right forehead.  Coworker who witnessed the event told him he had a jerk like movement in his body as he fell.  Past medical history of anxiety and depression, reports taking Prozac and hydroxyzine, starting his medications in the past 2 weeks, no medications prior to that.  Reports marijuana use, denies alcohol use.  Did not lose bladder control, did not bite tongue, no history of seizures, no reported postictal state.  Reports has been having left lower back pain now with some right lower back pain ongoing for the past month without trauma.  Denies fever, IV drug use, abdominal pain, loss of bowel or bladder control, saddle paresthesias, leg weakness.  No family history of arrhythmia or cardiac disease.       Home Medications Prior to Admission medications   Medication Sig Start Date End Date Taking? Authorizing Provider  benzonatate (TESSALON) 100 MG capsule Take 1 capsule (100 mg total) by mouth every 8 (eight) hours. 10/25/20   Chase Picket, MD  FLUoxetine (PROZAC) 20 MG capsule Take 1 capsule (20 mg total) by mouth daily. 08/18/20   Ethelene Hal, NP  mirtazapine (REMERON) 15 MG tablet Take 1 tablet (15 mg total) by mouth at bedtime. 08/17/20   Ethelene Hal, NP  ondansetron (ZOFRAN-ODT) 4 MG disintegrating tablet Take 1 tablet (4 mg total) by mouth every 8 (eight) hours as needed for nausea or vomiting. 05/12/22   Melynda Ripple, NP      Allergies     Banana    Review of Systems   Review of Systems Negative except as per HPI Physical Exam Updated Vital Signs BP 107/67   Pulse 68   Temp 98.2 F (36.8 C) (Oral)   Resp (!) 22   SpO2 95%  Physical Exam Vitals and nursing note reviewed.  Constitutional:      General: [name] "[name]" is not in acute distress.    Appearance: CAINON SHUBA "[name]" is well-developed. [name] "[name]" is not diaphoretic.  HENT:     Head: Normocephalic and atraumatic.     Mouth/Throat:     Mouth: Mucous membranes are moist.  Eyes:     Extraocular Movements: Extraocular movements intact.     Pupils: Pupils are equal, round, and reactive to light.  Cardiovascular:     Rate and Rhythm: Normal rate and regular rhythm.     Pulses: Normal pulses.     Heart sounds: Normal heart sounds.  Pulmonary:     Effort: Pulmonary effort is normal.     Breath sounds: Normal breath sounds.  Abdominal:     Palpations: Abdomen is soft.     Tenderness: There is no abdominal tenderness.  Musculoskeletal:     Cervical back: Normal range of motion and neck supple. No tenderness or bony tenderness.     Thoracic back: No tenderness or bony tenderness.  Lumbar back: No tenderness or bony tenderness.     Right lower leg: No edema.     Left lower leg: No edema.  Skin:    General: Skin is warm and dry.     Findings: No erythema or rash.  Neurological:     Mental Status: BETO FOUGEROUSSE "[name]" is alert and oriented to person, place, and time.     Cranial Nerves: No cranial nerve deficit.     Sensory: No sensory deficit.     Motor: No weakness.     Coordination: Coordination normal.     Gait: Gait normal.     Deep Tendon Reflexes: Reflexes normal.  Psychiatric:        Behavior: Behavior normal.     ED Results / Procedures / Treatments   Labs (all labs ordered are listed, but only abnormal results are displayed) Labs Reviewed  CBC WITH DIFFERENTIAL/PLATELET - Abnormal; Notable for the  following components:      Result Value   Platelets 95 (*)    All other components within normal limits  COMPREHENSIVE METABOLIC PANEL  ETHANOL    EKG EKG Interpretation  Date/Time:  Thursday August 10 2022 00:20:58 EST Ventricular Rate:  64 PR Interval:  120 QRS Duration: 98 QT Interval:  437 QTC Calculation: 451 R Axis:   54 Text Interpretation: Sinus rhythm No significant change was found Confirmed by Ezequiel Essex [phone]) on 08/10/2022 12:23:36 AM  Radiology CT Lumbar Spine Wo Contrast  Result Date: 08/10/2022 CLINICAL DATA:  Syncope, low back pain EXAM: CT LUMBAR SPINE WITHOUT CONTRAST TECHNIQUE: Multidetector CT imaging of the lumbar spine was performed without intravenous contrast administration. Multiplanar CT image reconstructions were also generated. RADIATION DOSE REDUCTION: This exam was performed according to the departmental dose-optimization program which includes automated exposure control, adjustment of the mA and/or kV according to patient size and/or use of iterative reconstruction technique. COMPARISON:  CT abdomen/pelvis dated 07/13/2019 FINDINGS: Segmentation: 5 lumbar type vertebral bodies. Alignment: Normal lumbar lordosis. Vertebrae: No acute fracture or focal pathologic process. Paraspinal and other soft tissues: Negative. Disc levels: Intervertebral disc spaces are maintained. Spinal canal is patent. IMPRESSION: Normal lumbar spine CT. Normal lumbar spine MR Electronically Signed   By: Julian Hy M.D.   On: 08/10/2022 01:52   CT Head Wo Contrast  Result Date: 08/10/2022 CLINICAL DATA:  Syncope. EXAM: CT HEAD WITHOUT CONTRAST TECHNIQUE: Contiguous axial images were obtained from the base of the skull through the vertex without intravenous contrast. RADIATION DOSE REDUCTION: This exam was performed according to the departmental dose-optimization program which includes automated exposure control, adjustment of the mA and/or kV according to patient size and/or  use of iterative reconstruction technique. COMPARISON:  09/28/2017 FINDINGS: Brain: No evidence of acute infarction, hemorrhage, hydrocephalus, extra-axial collection or mass lesion/mass effect. Vascular: No hyperdense vessel or unexpected calcification. Skull: Normal. Negative for fracture or focal lesion. Sinuses/Orbits: The visualized paranasal sinuses are essentially clear. The mastoid air cells are unopacified. Other: None. IMPRESSION: Normal head CT. Electronically Signed   By: Julian Hy M.D.   On: 08/10/2022 01:51    Procedures Procedures    Medications Ordered in ED Medications  sodium chloride 0.9 % bolus 1,000 mL (0 mLs Intravenous Stopped 08/10/22 0156)    ED Course/ Medical Decision Making/ A&P                             Medical Decision Making Amount and/or Complexity  of Data Reviewed Labs: ordered. Radiology: ordered.   This patient presents to the ED for concern of syncope and back pain, this involves an extensive number of treatment options, and is a complaint that carries with it a high risk of complications and morbidity.  The differential diagnosis includes but not limited to orthostatic hypotension, electrolyte disturbance, arrhythmia, intracranial injury, lumbar spine pathology   Co morbidities that complicate the patient evaluation  ADHD, anxiety, depression   Additional history obtained:  External records from outside source obtained and reviewed including prior labs on file, specifically platelet trend   Lab Tests:  I Ordered, and personally interpreted labs.  The pertinent results include: EtOH negative.  CMP within normal notes.  CBC with thrombocytopenia with platelets of 95   Imaging Studies ordered:  I ordered imaging studies including CT head, CT lumbar spine I independently visualized and interpreted imaging which showed no acute abnormality I agree with the radiologist interpretation   Cardiac Monitoring: / EKG:  The patient was  maintained on a cardiac monitor.  I personally viewed and interpreted the cardiac monitored which showed an underlying rhythm of: Sinus rhythm, rate 64. Orthostatic vitals reviewed, not orthostatic   Consultations Obtained:  I requested consultation with the ER attending, Dr. Di Kindle,  and discussed lab and imaging findings as well as pertinent plan - they recommend: Agree with plan of care   Problem List / ED Course / Critical interventions / Medication management  27 year old patient presents with concern for syncopal episode tonight as above.  Exam is unremarkable, patient is not orthostatic.  Labs are reassuring other than mild thrombocytopenia, downtrending compared to prior on file.  Results were printed and provided to patient tonight to follow-up with primary care.  Recommend return to ER for any subsequent episodes or worsening symptoms otherwise recheck with PCP. I ordered medication including IV fluids for hydration Reevaluation of the patient after these medicines showed that the patient  found to be not orthostatic I have reviewed the patients home medicines and have made adjustments as needed   Social Determinants of Health:  Has PCP   Test / Admission - Considered:  Low risk for PE         Final Clinical Impression(s) / ED Diagnoses Final diagnoses:  Syncope and collapse  Thrombocytopenia [facility])    Rx / DC Orders ED Discharge Orders     None         Tacy Learn, PA-C 08/10/22 Purnell Shoemaker, MD 08/10/22 [phone]

## 2022-08-10 ENCOUNTER — Emergency Department (HOSPITAL_COMMUNITY): Payer: Medicaid Other

## 2022-08-10 ENCOUNTER — Other Ambulatory Visit: Payer: Self-pay

## 2022-08-10 ENCOUNTER — Encounter (HOSPITAL_COMMUNITY): Payer: Self-pay | Admitting: Emergency Medicine

## 2022-08-10 DIAGNOSIS — M545 Low back pain, unspecified: Secondary | ICD-10-CM | POA: Diagnosis not present

## 2022-08-10 DIAGNOSIS — R55 Syncope and collapse: Secondary | ICD-10-CM | POA: Diagnosis not present

## 2022-08-10 LAB — COMPREHENSIVE METABOLIC PANEL
ALT: 16 U/L (ref 0–44)
AST: 17 U/L (ref 15–41)
Albumin: 4.6 g/dL (ref 3.5–5.0)
Alkaline Phosphatase: 38 U/L (ref 38–126)
Anion gap: 6 (ref 5–15)
BUN: 15 mg/dL (ref 6–20)
CO2: 25 mmol/L (ref 22–32)
Calcium: 9.2 mg/dL (ref 8.9–10.3)
Chloride: 106 mmol/L (ref 98–111)
Creatinine, Ser: 0.66 mg/dL (ref 0.61–1.24)
GFR, Estimated: >60 mL/min (ref >60)
Glucose, Bld: 90 mg/dL (ref 70–99)
Potassium: 3.8 mmol/L (ref 3.5–5.1)
Sodium: 137 mmol/L (ref 135–145)
Total Bilirubin: 0.7 mg/dL (ref 0.3–1.2)
Total Protein: 7.7 g/dL (ref 6.5–8.1)

## 2022-08-10 LAB — CBC WITH DIFFERENTIAL/PLATELET
Abs Immature Granulocytes: 0.02 10*3/uL (ref 0.00–0.07)
Basophils Absolute: 0 10*3/uL (ref 0.0–0.1)
Basophils Relative: 0 %
Eosinophils Absolute: 0.2 10*3/uL (ref 0.0–0.5)
Eosinophils Relative: 2 %
HCT: 39.5 % (ref 39.0–52.0)
Hemoglobin: 13 g/dL (ref 13.0–17.0)
Immature Granulocytes: 0 %
Lymphocytes Relative: 28 %
Lymphs Abs: 2.2 10*3/uL (ref 0.7–4.0)
MCH: 29.1 pg (ref 26.0–34.0)
MCHC: 32.9 g/dL (ref 30.0–36.0)
MCV: 88.4 fL (ref 80.0–100.0)
Monocytes Absolute: 0.6 10*3/uL (ref 0.1–1.0)
Monocytes Relative: 8 %
Neutro Abs: 5 10*3/uL (ref 1.7–7.7)
Neutrophils Relative %: 62 %
Platelets: 95 10*3/uL — ABNORMAL LOW (ref 150–400)
RBC: 4.47 MIL/uL (ref 4.22–5.81)
RDW: 12 % (ref 11.5–15.5)
WBC: 8 10*3/uL (ref 4.0–10.5)
nRBC: 0 % (ref 0.0–0.2)

## 2022-08-10 LAB — ETHANOL: Alcohol, Ethyl (B): <10 mg/dL (ref <10)

## 2022-08-10 MED ORDER — SODIUM CHLORIDE 0.9 % IV BOLUS
1000.0000 mL | Freq: Once | INTRAVENOUS | Status: AC
  Administered 2022-08-10: 1000 mL via INTRAVENOUS

## 2022-08-10 NOTE — Discharge Instructions (Addendum)
Follow with your doctor as discussed.  Return to the emergency room for worsening, recurrent or concerning symptoms.

## 2022-08-10 NOTE — ED Notes (Signed)
Pt. Made aware for the need of urine specimen. 

## 2022-09-04 DIAGNOSIS — Z419 Encounter for procedure for purposes other than remedying health state, unspecified: Secondary | ICD-10-CM | POA: Diagnosis not present

## 2022-10-04 DIAGNOSIS — Z419 Encounter for procedure for purposes other than remedying health state, unspecified: Secondary | ICD-10-CM | POA: Diagnosis not present

## 2022-11-04 DIAGNOSIS — Z419 Encounter for procedure for purposes other than remedying health state, unspecified: Secondary | ICD-10-CM | POA: Diagnosis not present

## 2022-12-04 DIAGNOSIS — Z419 Encounter for procedure for purposes other than remedying health state, unspecified: Secondary | ICD-10-CM | POA: Diagnosis not present

## 2023-01-04 DIAGNOSIS — Z419 Encounter for procedure for purposes other than remedying health state, unspecified: Secondary | ICD-10-CM | POA: Diagnosis not present

## 2023-01-22 ENCOUNTER — Telehealth (HOSPITAL_COMMUNITY): Payer: Self-pay | Admitting: Licensed Clinical Social Worker

## 2023-01-22 NOTE — Telephone Encounter (Signed)
Clinician Alphonzo Dublin, who prefers to go by "[name]" today at 11:07am to offer availability in schedule for therapy.  [name] did not answer this phone call, and a voicemail could not be left due to their mailbox being full.  Clinician will attempt phone outreach again at a later date, and attempt outreach by mail if second attempt also proves unsuccessful.    Noralee Stain, [place], LCAS 01/22/23

## 2023-02-04 DIAGNOSIS — Z419 Encounter for procedure for purposes other than remedying health state, unspecified: Secondary | ICD-10-CM | POA: Diagnosis not present

## 2023-03-06 DIAGNOSIS — Z419 Encounter for procedure for purposes other than remedying health state, unspecified: Secondary | ICD-10-CM | POA: Diagnosis not present

## 2023-04-06 DIAGNOSIS — Z419 Encounter for procedure for purposes other than remedying health state, unspecified: Secondary | ICD-10-CM | POA: Diagnosis not present

## 2023-08-22 ENCOUNTER — Encounter

## 2023-08-22 ENCOUNTER — Telehealth: Admitting: Physician Assistant

## 2023-08-22 DIAGNOSIS — R6889 Other general symptoms and signs: Secondary | ICD-10-CM | POA: Diagnosis not present

## 2023-08-23 MED ORDER — OSELTAMIVIR PHOSPHATE 75 MG PO CAPS
75.0000 mg | ORAL_CAPSULE | Freq: Two times a day (BID) | ORAL | 0 refills | Status: AC
Start: 2023-08-23 — End: 2023-08-28

## 2023-08-23 NOTE — Progress Notes (Signed)
 Message sent to patient requesting further input regarding current symptoms. Awaiting patient response.

## 2023-08-23 NOTE — Progress Notes (Signed)
 I have spent 5 minutes in review of e-visit questionnaire, review and updating patient chart, medical decision making and response to patient.   Piedad Climes, PA-C

## 2023-08-23 NOTE — Progress Notes (Signed)

## 2024-01-08 ENCOUNTER — Telehealth: Admitting: Family Medicine

## 2024-01-08 ENCOUNTER — Encounter: Payer: Self-pay | Admitting: Family Medicine

## 2024-01-08 DIAGNOSIS — J069 Acute upper respiratory infection, unspecified: Secondary | ICD-10-CM | POA: Diagnosis not present

## 2024-01-08 MED ORDER — IPRATROPIUM BROMIDE 0.03 % NA SOLN
2.0000 | Freq: Two times a day (BID) | NASAL | 0 refills | Status: DC
Start: 2024-01-08 — End: 2024-04-09

## 2024-01-08 MED ORDER — BENZONATATE 100 MG PO CAPS: 100.0000 mg | ORAL_CAPSULE | Freq: Three times a day (TID) | ORAL | 0 refills | Status: DC | PRN

## 2024-01-08 NOTE — Progress Notes (Signed)

## 2024-02-18 DIAGNOSIS — F411 Generalized anxiety disorder: Secondary | ICD-10-CM | POA: Diagnosis not present

## 2024-02-18 DIAGNOSIS — E559 Vitamin D deficiency, unspecified: Secondary | ICD-10-CM | POA: Diagnosis not present

## 2024-02-18 DIAGNOSIS — R569 Unspecified convulsions: Secondary | ICD-10-CM | POA: Diagnosis not present

## 2024-02-18 DIAGNOSIS — G4700 Insomnia, unspecified: Secondary | ICD-10-CM | POA: Diagnosis not present

## 2024-02-18 DIAGNOSIS — F331 Major depressive disorder, recurrent, moderate: Secondary | ICD-10-CM | POA: Diagnosis not present

## 2024-02-18 DIAGNOSIS — D649 Anemia, unspecified: Secondary | ICD-10-CM | POA: Diagnosis not present

## 2024-02-18 DIAGNOSIS — R5383 Other fatigue: Secondary | ICD-10-CM | POA: Diagnosis not present

## 2024-02-18 DIAGNOSIS — Z23 Encounter for immunization: Secondary | ICD-10-CM | POA: Diagnosis not present

## 2024-02-21 DIAGNOSIS — D696 Thrombocytopenia, unspecified: Secondary | ICD-10-CM | POA: Diagnosis not present

## 2024-02-21 DIAGNOSIS — A048 Other specified bacterial intestinal infections: Secondary | ICD-10-CM | POA: Diagnosis not present

## 2024-02-21 DIAGNOSIS — K219 Gastro-esophageal reflux disease without esophagitis: Secondary | ICD-10-CM | POA: Diagnosis not present

## 2024-02-21 DIAGNOSIS — E559 Vitamin D deficiency, unspecified: Secondary | ICD-10-CM | POA: Diagnosis not present

## 2024-03-20 ENCOUNTER — Telehealth: Admitting: Emergency Medicine

## 2024-03-20 DIAGNOSIS — K529 Noninfective gastroenteritis and colitis, unspecified: Secondary | ICD-10-CM

## 2024-03-20 MED ORDER — ONDANSETRON HCL 4 MG PO TABS: 4.0000 mg | ORAL_TABLET | Freq: Three times a day (TID) | ORAL | 0 refills | Status: AC | PRN

## 2024-03-20 NOTE — Progress Notes (Signed)
 We are sorry that you are not feeling well.  Here is how we plan to help!  Based on what you have shared with me it looks like you have Acute Infectious Diarrhea.  Most cases of acute diarrhea are due to infections with virus and bacteria and are self-limited conditions lasting less than 14 days.  For your symptoms you may take Imodium  2 mg tablets that are over the counter at your local pharmacy. Take two tablet now and then one after each loose stool up to 6 a day.  Antibiotics are not needed for most people with diarrhea.  Optional: I have also prescribed Zofran  4 mg 1 tablet every 8 hours as needed for nausea and vomiting   HOME CARE We recommend changing your diet to help with your symptoms for the next few days. Drink plenty of fluids that contain water  salt and sugar. Sports drinks such as Gatorade may help.  You may try broths, soups, bananas, applesauce, soft breads, mashed potatoes or crackers.  You are considered infectious for as long as the diarrhea continues. Hand washing or use of alcohol based hand sanitizers is recommend. It is best to stay out of work or school until your symptoms stop.   GET HELP RIGHT AWAY If you have dark yellow colored urine or do not pass urine frequently you should drink more fluids.   If your symptoms worsen  If you feel like you are going to pass out (faint) You have a new problem  MAKE SURE YOU  Understand these instructions. Will watch your condition. Will get help right away if you are not doing well or get worse.  Your e-visit answers were reviewed by a board certified advanced clinical practitioner to complete your personal care plan.  Depending on the condition, your plan could have included both over the counter or prescription medications.  If there is a problem please reply  once you have received a response from your provider.  Your safety is important to us .  If you have drug allergies check your prescription carefully.    You  can use MyChart to ask questions about today's visit, request a non-urgent call back, or ask for a work or school excuse for 24 hours related to this e-Visit. If it has been greater than 24 hours you will need to follow up with your provider, or enter a new e-Visit to address those concerns.   You will get an e-mail in the next two days asking about your experience.  I hope that your e-visit has been valuable and will speed your recovery. Thank you for using e-visits.   I have spent 5 minutes in review of e-visit questionnaire, review and updating patient chart, medical decision making and response to patient.   Jon Belt, PhD, FNP-BC

## 2024-04-09 ENCOUNTER — Telehealth: Admitting: Physician Assistant

## 2024-04-09 DIAGNOSIS — J069 Acute upper respiratory infection, unspecified: Secondary | ICD-10-CM | POA: Diagnosis not present

## 2024-04-09 MED ORDER — PSEUDOEPH-BROMPHEN-DM 30-2-10 MG/5ML PO SYRP: 5.0000 mL | ORAL_SOLUTION | Freq: Four times a day (QID) | ORAL | 0 refills | Status: AC | PRN

## 2024-04-09 MED ORDER — FLUTICASONE PROPIONATE 50 MCG/ACT NA SUSP: 2.0000 | Freq: Every day | NASAL | 0 refills | Status: AC

## 2024-04-09 MED ORDER — LIDOCAINE VISCOUS HCL 2 % MT SOLN: 5.0000 mL | Freq: Four times a day (QID) | OROMUCOSAL | 0 refills | Status: AC | PRN

## 2024-04-09 NOTE — Progress Notes (Signed)
 E-Visit for COVID/Influenza Screening  Your current symptoms could be consistent with COVID-19 or Influenza. Please complete a COVID + Flu Test at home, or check with your local pharmacy to see if they provide testing.   If you have tested positive for either COVID or Influenza, it means that you were infected with that particular virus and could give the virus to others.  Most people with these infections have a mild illness and can recover at home without medical care. Do not leave your home, except to get medical care.  DO not visit public areas and do not go to places where you are unable to wear a mask. It is important for you to stay home to take care of yourself and to help protect other people in your home and community.   Isolation Instructions:  You are to isolate at home for now until you have taken your home COVID/Flu test and notified our team of your results, at which time further isolation instructions will be given.  If you must be around other household members who do not have symptoms, you need to make sure that both you and the family members are masking consistently with a high-quality mask, even while in the home.  If you note any worsening of symptoms despite treatment, please seek an IN-PERSON evaluation ASAP. If you note any significant shortness of breath or any chest pain, please seek immediate ER evaluation. Please do not delay care!  Go to the nearest hospital ED for assessment if fever/cough/breathlessness are severe or illness seems like a threat to life.    The following symptoms may appear 2-14 days after exposure: Fever Cough Shortness of breath or difficulty breathing Chills Repeated shaking with chills Muscle pain Headache Sore throat New loss of taste or smell Fatigue Congestion or runny nose Nausea or vomiting Diarrhea   For symptoms,  I have prescribed Fluticasone nasal spray 2 sprays in each nostril one time per dayasal spray 2 sprays in each  nostril one time per day, Bromfed DM cough syrup Take 5mL every 6 hours as needed for cough, and Viscous Lidocaine  2% Swallow 5-10mL every 6 hours as needed for sore throat.  You may also take acetaminophen  (Tylenol ) as needed for fever.  A work note has been provided for you today. It will be available under letters in your MyChart account.   Reduce your risk of any infection by using the same precautions used for avoiding the common cold or flu:  Wash your hands often with soap and warm water  for at least 20 seconds.  If soap and water  are not readily available, use an alcohol-based hand sanitizer with at least 60% alcohol.  If coughing or sneezing, cover your mouth and nose by coughing or sneezing into the elbow areas of your shirt or coat, into a tissue or into your sleeve (not your hands). Avoid shaking hands with others and consider head nods or verbal greetings only. Avoid touching your eyes, nose, or mouth with unwashed hands.  Avoid close contact with people who are sick. Avoid places or events with large numbers of people in one location, like concerts or sporting events. Carefully consider travel plans you have or are making. If you are planning any travel outside or inside the [place] , visit the CDC's Travelers' Health webpage for the latest health notices. If you have some symptoms but not all symptoms, continue to monitor at home and seek medical attention if your symptoms worsen. If you are having a  medical emergency, call 911.  HOME CARE Only take medications as instructed by your medical team. Drink plenty of fluids and get plenty of rest. A steam or ultrasonic humidifier can help if you have congestion.   GET HELP RIGHT AWAY IF YOU HAVE EMERGENCY WARNING SIGNS** FOR COVID-19. If you or someone is showing any of these signs seek emergency medical care immediately. Call 911 or proceed to your closest emergency facility if: You develop worsening high fever. Trouble  breathing. Bluish lips or face. Persistent pain or pressure in the chest. New confusion. Inability to wake or stay awake. You cough up blood. Your symptoms become more severe.  **This list is not all possible symptoms. Contact your medical provider for any symptoms that are sever or concerning to you.   MAKE SURE YOU  Understand these instructions. Will watch your condition. Will get help right away if you are not doing well or get worse.  Your e-visit answers were reviewed by a board certified advanced clinical practitioner to complete your personal care plan.  Depending on the condition, your plan could have included both over the counter or prescription medications.  If there is a problem, please reply once you have received a response from your provider.  Your safety is important to us .  If you have drug allergies check your prescription carefully.    You can use MyChart to ask questions about today's visit, request a non-urgent call back, or ask for a work or school excuse for 24 hours related to this e-Visit. If it has been greater than 24 hours you will need to follow up with your provider, or enter a new e-Visit to address those concerns. You will get an e-mail in the next two days asking about your experience.  I hope that your e-visit has been valuable and will speed your recovery. Thank you for using e-visits.   I have spent 5 minutes in review of e-visit questionnaire, review and updating patient chart, medical decision making and response to patient.   Delon CHRISTELLA Dickinson, PA-C

## 2024-04-16 DIAGNOSIS — Z0000 Encounter for general adult medical examination without abnormal findings: Secondary | ICD-10-CM | POA: Diagnosis not present

## 2024-04-22 DIAGNOSIS — F411 Generalized anxiety disorder: Secondary | ICD-10-CM | POA: Diagnosis not present

## 2024-04-22 DIAGNOSIS — K219 Gastro-esophageal reflux disease without esophagitis: Secondary | ICD-10-CM | POA: Diagnosis not present

## 2024-04-22 DIAGNOSIS — Z0000 Encounter for general adult medical examination without abnormal findings: Secondary | ICD-10-CM | POA: Diagnosis not present

## 2024-04-29 DIAGNOSIS — F331 Major depressive disorder, recurrent, moderate: Secondary | ICD-10-CM | POA: Diagnosis not present

## 2024-04-29 DIAGNOSIS — F411 Generalized anxiety disorder: Secondary | ICD-10-CM | POA: Diagnosis not present

## 2024-04-29 DIAGNOSIS — G4700 Insomnia, unspecified: Secondary | ICD-10-CM | POA: Diagnosis not present

## 2024-04-29 DIAGNOSIS — K219 Gastro-esophageal reflux disease without esophagitis: Secondary | ICD-10-CM | POA: Diagnosis not present
